# Patient Record
Sex: Male | Born: 1970 | Race: White | Hispanic: No | Marital: Married | Smoking: Former smoker
Health system: Southern US, Community
[De-identification: ages and names within clinical notes are randomized; demographics above are authoritative.]

## PROBLEM LIST (undated history)

## (undated) DIAGNOSIS — K5792 Diverticulitis of intestine, part unspecified, without perforation or abscess without bleeding: Secondary | ICD-10-CM

## (undated) DIAGNOSIS — F419 Anxiety disorder, unspecified: Secondary | ICD-10-CM

## (undated) DIAGNOSIS — K900 Celiac disease: Secondary | ICD-10-CM

## (undated) DIAGNOSIS — I2510 Atherosclerotic heart disease of native coronary artery without angina pectoris: Secondary | ICD-10-CM

## (undated) DIAGNOSIS — IMO0001 Reserved for inherently not codable concepts without codable children: Secondary | ICD-10-CM

## (undated) DIAGNOSIS — I10 Essential (primary) hypertension: Secondary | ICD-10-CM

## (undated) HISTORY — PX: COLONOSCOPY: SHX5424

## (undated) HISTORY — PX: VASECTOMY: SHX75

## (undated) HISTORY — PX: KNEE ARTHROSCOPY W/ MENISCAL REPAIR: SHX1877

## (undated) HISTORY — DX: Celiac disease: K90.0

## (undated) HISTORY — PX: OTHER SURGICAL HISTORY: SHX169

## (undated) HISTORY — PX: RETINOPATHY SURGERY: SHX765

## (undated) HISTORY — DX: Atherosclerotic heart disease of native coronary artery without angina pectoris: I25.10

---

## 1998-08-08 ENCOUNTER — Emergency Department (HOSPITAL_COMMUNITY): Admission: EM | Admit: 1998-08-08 | Discharge: 1998-08-08 | Payer: Self-pay | Admitting: Emergency Medicine

## 1998-08-26 ENCOUNTER — Encounter: Payer: Self-pay | Admitting: Internal Medicine

## 1998-08-26 ENCOUNTER — Ambulatory Visit (HOSPITAL_COMMUNITY): Admission: RE | Admit: 1998-08-26 | Discharge: 1998-08-26 | Payer: Self-pay | Admitting: Internal Medicine

## 1999-06-15 ENCOUNTER — Encounter: Admission: RE | Admit: 1999-06-15 | Discharge: 1999-06-15 | Payer: Self-pay | Admitting: Family Medicine

## 1999-08-11 ENCOUNTER — Encounter: Admission: RE | Admit: 1999-08-11 | Discharge: 1999-08-11 | Payer: Self-pay | Admitting: Family Medicine

## 1999-08-11 ENCOUNTER — Encounter: Payer: Self-pay | Admitting: Family Medicine

## 2000-03-09 ENCOUNTER — Ambulatory Visit (HOSPITAL_COMMUNITY): Admission: RE | Admit: 2000-03-09 | Discharge: 2000-03-09 | Payer: Self-pay | Admitting: *Deleted

## 2003-07-05 ENCOUNTER — Ambulatory Visit (HOSPITAL_COMMUNITY): Admission: RE | Admit: 2003-07-05 | Discharge: 2003-07-05 | Payer: Self-pay | Admitting: Family Medicine

## 2003-07-06 ENCOUNTER — Encounter: Admission: RE | Admit: 2003-07-06 | Discharge: 2003-07-06 | Payer: Self-pay | Admitting: Family Medicine

## 2004-02-17 ENCOUNTER — Encounter: Admission: RE | Admit: 2004-02-17 | Discharge: 2004-05-17 | Payer: Self-pay | Admitting: Family Medicine

## 2005-05-03 ENCOUNTER — Encounter: Admission: RE | Admit: 2005-05-03 | Discharge: 2005-05-03 | Payer: Self-pay | Admitting: Family Medicine

## 2012-10-03 ENCOUNTER — Other Ambulatory Visit: Payer: Self-pay | Admitting: Family Medicine

## 2012-10-03 DIAGNOSIS — M549 Dorsalgia, unspecified: Secondary | ICD-10-CM

## 2012-10-07 ENCOUNTER — Ambulatory Visit
Admission: RE | Admit: 2012-10-07 | Discharge: 2012-10-07 | Disposition: A | Discharge status: Home / Self Care | Payer: PRIVATE HEALTH INSURANCE | Source: Ambulatory Visit | Attending: Family Medicine | Admitting: Family Medicine

## 2012-10-07 DIAGNOSIS — M549 Dorsalgia, unspecified: Secondary | ICD-10-CM

## 2013-09-04 HISTORY — PX: HERNIA REPAIR: SHX51

## 2014-04-20 ENCOUNTER — Encounter (INDEPENDENT_AMBULATORY_CARE_PROVIDER_SITE_OTHER): Payer: Self-pay | Admitting: Surgery

## 2014-04-20 ENCOUNTER — Ambulatory Visit (INDEPENDENT_AMBULATORY_CARE_PROVIDER_SITE_OTHER): Payer: BC Managed Care – PPO | Admitting: Surgery

## 2014-04-20 VITALS — BP 142/100 | HR 86 | Temp 98.5°F | Ht 73.0 in | Wt 231.2 lb

## 2014-04-20 DIAGNOSIS — K4090 Unilateral inguinal hernia, without obstruction or gangrene, not specified as recurrent: Secondary | ICD-10-CM | POA: Insufficient documentation

## 2014-04-20 NOTE — Patient Instructions (Signed)
Our schedules will call you back to schedule surgery. 

## 2014-04-20 NOTE — Progress Notes (Signed)
Patient ID: Chase Mcclure, male   DOB: Jun 12, 1971, 43 y.o.   MRN: [id number]  Chief Complaint  Patient presents with  . Hernia    HPI Chase Mcclure is a 43 y.o. male.   HPI This is a pleasant gentleman referred by [name] for evaluation of a symptomatic right inguinal hernia. He noticed discomfort and a bulge in the right groin after heavy lifting several weeks ago.  He reports a small bulge that easily reduces. Again the discomfort is mild and does not refer any where else. He has no obstructive symptoms. Past Medical History  Diagnosis Date  . Celiac disease     Past Surgical History  Procedure Laterality Date  . Hernia repair    . Vasectomy      History reviewed. No pertinent family history.  Social History History  Substance Use Topics  . Smoking status: Former Games developer  . Smokeless tobacco: Not on file  . Alcohol Use: Yes    Allergies  Allergen Reactions  . Gluten Meal     No current outpatient prescriptions on file.   No current facility-administered medications for this visit.    Review of Systems Review of Systems  Constitutional: Negative for fever, chills and unexpected weight change.  HENT: Negative for congestion, hearing loss, sore throat, trouble swallowing and voice change.   Eyes: Negative for visual disturbance.  Respiratory: Negative for cough and wheezing.   Cardiovascular: Negative for chest pain, palpitations and leg swelling.  Gastrointestinal: Positive for abdominal pain. Negative for nausea, vomiting, diarrhea, constipation, blood in stool, abdominal distention, anal bleeding and rectal pain.  Genitourinary: Negative for hematuria and difficulty urinating.  Musculoskeletal: Negative for arthralgias.  Skin: Negative for rash and wound.  Neurological: Negative for seizures, syncope, weakness and headaches.  Hematological: Negative for adenopathy. Does not bruise/bleed easily.  Psychiatric/Behavioral: Negative for confusion.    Blood  pressure 142/100, pulse 86, temperature 98.5 F (36.9 C), temperature source Oral, height 6\' 1"  (1.854 m), weight 231 lb 4 oz (104.894 kg).  Physical Exam Physical Exam  Constitutional: He is oriented to person, place, and time. He appears well-developed and well-nourished. No distress.  HENT:  Head: Normocephalic and atraumatic.  Right Ear: External ear normal.  Left Ear: External ear normal.  Nose: Nose normal.  Mouth/Throat: Oropharynx is clear and moist. No oropharyngeal exudate.  Eyes: Conjunctivae are normal. Pupils are equal, round, and reactive to light. Right eye exhibits no discharge. Left eye exhibits no discharge. No scleral icterus.  Neck: Normal range of motion. Neck supple. No tracheal deviation present.  Cardiovascular: Normal rate, regular rhythm, normal heart sounds and intact distal pulses.   No murmur heard. Pulmonary/Chest: Breath sounds normal. No respiratory distress. He has no wheezes.  Abdominal: Soft. He exhibits no distension. There is no tenderness.  Easily reducible small right inguinal hernia without evidence of left inguinal hernia  Musculoskeletal: Normal range of motion. He exhibits no edema and no tenderness.  Neurological: He is alert and oriented to person, place, and time.  Skin: Skin is warm and dry. No rash noted. He is not diaphoretic. No erythema.  Psychiatric: His behavior is normal. Judgment normal.    Data Reviewed   Assessment    Right inguinal hernia     Plan    Repair with mesh was recommended. I discussed both the open and laparoscopic techniques. He wishes to proceed with laparoscopic hernia repair with mesh of the right inguinal hernia. I discussed the risks of surgery  which includes but is not limited to bleeding, infection, injury to surrounding structures, nerve entrapment, chronic pain, recurrence, et Karie Sodacetera. I also discussed postoperative recovery. He wishes to proceed with surgery.        Audyn Dimercurio A 04/20/2014,  12:11 PM

## 2014-04-27 ENCOUNTER — Encounter (HOSPITAL_COMMUNITY): Payer: Self-pay | Admitting: Pharmacist

## 2014-04-27 NOTE — Progress Notes (Signed)
Dr. Magnus Ivan - Please enter preop orders in epic for Chase Mcclure - add on for surgery 8/27 and he is coming to [facility] tomorrow 8/25 for preop / labs.  Thanks.

## 2014-04-27 NOTE — Patient Instructions (Signed)
GABREIL YONKERS  04/27/2014   Your procedure is scheduled on:  04/30/2014  Report to [facility].  Follow the Signs to Short Stay Center at  1:35pm       Call this number if you have problems the morning of surgery: [phone]   Remember:   Do not eat food after midnight, you may have clear liquids until 9 am then NPO  Take these medicines the morning of surgery with A SIP OF WATER:    Do not wear jewelry  Do not wear lotions, powders, or perfumes,deodorant    Men may shave face and neck.  Do not bring valuables to the hospital.  Contacts, dentures or bridgework may not be worn into surgery.     Patients discharged the day of surgery will not be allowed to drive  home.  Name and phone number of your driver:     Please read over the following fact sheets that you were given:    CLEAR LIQUID DIET   Foods Allowed                                                                     Foods Excluded  Coffee and tea, regular and decaf                             liquids that you cannot  Plain Jell-O in any flavor                                             see through such as: Fruit ices (not with fruit pulp)                                     milk, soups, orange juice  Iced Popsicles                                    All solid food Carbonated beverages, regular and diet                                    Cranberry, grape and apple juices Sports drinks like Gatorade Lightly seasoned clear broth or consume(fat free) Sugar, honey syrup  Sample Menu Breakfast                                Lunch                                     Supper Cranberry juice                    Beef broth  Chicken broth Jell-O                                     Grape juice                           Apple juice Coffee or tea                        Jell-O                                      Popsicle                                                Coffee or tea                         Coffee or tea  _____________________________________________________________________  [facility] - Preparing for Surgery Before surgery, you can play an important role.  Because skin is not sterile, your skin needs to be as free of germs as possible.  You can reduce the number of germs on your skin by washing with CHG (chlorahexidine gluconate) soap before surgery.  CHG is an antiseptic cleaner which kills germs and bonds with the skin to continue killing germs even after washing. Please DO NOT use if you have an allergy to CHG or antibacterial soaps.  If your skin becomes reddened/irritated stop using the CHG and inform your nurse when you arrive at Short Stay. Do not shave (including legs and underarms) for at least 48 hours prior to the first CHG shower.  You may shave your face/neck. Please follow these instructions carefully:  1.  Shower with CHG Soap the night before surgery and the  morning of Surgery.  2.  If you choose to wash your hair, wash your hair first as usual with your  normal  shampoo.  3.  After you shampoo, rinse your hair and body thoroughly to remove the  shampoo.                           4.  Use CHG as you would any other liquid soap.  You can apply chg directly  to the skin and wash                       Gently with a scrungie or clean washcloth.  5.  Apply the CHG Soap to your body ONLY FROM THE NECK DOWN.   Do not use on face/ open                           Wound or open sores. Avoid contact with eyes, ears mouth and genitals (private parts).                       Wash face,  Genitals (private parts) with your normal soap.             6.  Wash thoroughly, paying special attention to the area  where your surgery  will be performed.  7.  Thoroughly rinse your body with warm water from the neck down.  8.  DO NOT shower/wash with your normal soap after using and rinsing off  the CHG Soap.                9.  Pat yourself dry with a clean towel.            10.   Wear clean pajamas.            11.  Place clean sheets on your bed the night of your first shower and do not  sleep with pets. Day of Surgery : Do not apply any lotions/deodorants the morning of surgery.  Please wear clean clothes to the hospital/surgery center.  FAILURE TO FOLLOW THESE INSTRUCTIONS MAY RESULT IN THE CANCELLATION OF YOUR SURGERY PATIENT SIGNATURE_________________________________  NURSE SIGNATURE__________________________________  ________________________________________________________________________  coughing and deep breathing exercises, leg exercises

## 2014-04-28 ENCOUNTER — Other Ambulatory Visit (INDEPENDENT_AMBULATORY_CARE_PROVIDER_SITE_OTHER): Payer: Self-pay | Admitting: Surgery

## 2014-04-28 ENCOUNTER — Telehealth (INDEPENDENT_AMBULATORY_CARE_PROVIDER_SITE_OTHER): Payer: Self-pay

## 2014-04-28 ENCOUNTER — Encounter (HOSPITAL_COMMUNITY): Payer: Self-pay

## 2014-04-28 ENCOUNTER — Encounter (HOSPITAL_COMMUNITY)
Admission: RE | Admit: 2014-04-28 | Discharge: 2014-04-28 | Disposition: A | Discharge status: Home / Self Care | Payer: BC Managed Care – PPO | Source: Ambulatory Visit | Attending: Surgery | Admitting: Surgery

## 2014-04-28 DIAGNOSIS — Z01818 Encounter for other preprocedural examination: Secondary | ICD-10-CM | POA: Diagnosis present

## 2014-04-28 DIAGNOSIS — K4090 Unilateral inguinal hernia, without obstruction or gangrene, not specified as recurrent: Secondary | ICD-10-CM | POA: Insufficient documentation

## 2014-04-28 LAB — CBC
HCT: 44.7 % (ref 39.0–52.0)
HEMOGLOBIN: 15.7 g/dL (ref 13.0–17.0)
MCH: 32.3 pg (ref 26.0–34.0)
MCHC: 35.1 g/dL (ref 30.0–36.0)
MCV: 92 fL (ref 78.0–100.0)
Platelets: 222 10*3/uL (ref 150–400)
RBC: 4.86 MIL/uL (ref 4.22–5.81)
RDW: 12 % (ref 11.5–15.5)
WBC: 7.9 10*3/uL (ref 4.0–10.5)

## 2014-04-28 NOTE — Telephone Encounter (Signed)
Pt wife called in stating pt is scheduled for lap inguinal hernia repair on Thursday and they have family coming in Saturday for a dinner party at a restaurant. She is asking if he would feel ok to go. Advised he should expect pain and swelling in groin and testicles which may keep him from feeling like going. Advised we want him to get up for short walks through out the day for good blood flow. Let her know some patient have different pain tolerance so I cannot say if he would be in to much pain. Advised he would not drive if he goes and to take a pillow to help support his groin area from the seat belt. Also suggested he keep ice pack on area to help control the swelling. It would be up to the patient os how he feels Saturday about going to dinner.

## 2014-04-30 ENCOUNTER — Ambulatory Visit (HOSPITAL_COMMUNITY): Payer: PRIVATE HEALTH INSURANCE | Admitting: Certified Registered Nurse Anesthetist

## 2014-04-30 ENCOUNTER — Encounter (HOSPITAL_COMMUNITY)
Admission: RE | Disposition: A | Discharge status: Home / Self Care | Payer: Self-pay | Source: Ambulatory Visit | Attending: Surgery

## 2014-04-30 ENCOUNTER — Encounter (HOSPITAL_COMMUNITY): Payer: PRIVATE HEALTH INSURANCE | Admitting: Certified Registered Nurse Anesthetist

## 2014-04-30 ENCOUNTER — Ambulatory Visit (HOSPITAL_COMMUNITY)
Admission: RE | Admit: 2014-04-30 | Discharge: 2014-04-30 | Disposition: A | Discharge status: Home / Self Care | Payer: PRIVATE HEALTH INSURANCE | Source: Ambulatory Visit | Attending: Surgery | Admitting: Surgery

## 2014-04-30 ENCOUNTER — Encounter (HOSPITAL_COMMUNITY): Payer: Self-pay | Admitting: *Deleted

## 2014-04-30 DIAGNOSIS — Z87891 Personal history of nicotine dependence: Secondary | ICD-10-CM | POA: Insufficient documentation

## 2014-04-30 DIAGNOSIS — K4090 Unilateral inguinal hernia, without obstruction or gangrene, not specified as recurrent: Secondary | ICD-10-CM | POA: Insufficient documentation

## 2014-04-30 HISTORY — PX: INSERTION OF MESH: SHX5868

## 2014-04-30 HISTORY — PX: INGUINAL HERNIA REPAIR: SHX194

## 2014-04-30 SURGERY — REPAIR, HERNIA, INGUINAL, LAPAROSCOPIC
Anesthesia: General | Laterality: Right

## 2014-04-30 MED ORDER — LACTATED RINGERS IR SOLN
Status: DC | PRN
  Administered 2014-04-30: 1000 mL

## 2014-04-30 MED ORDER — CEFAZOLIN SODIUM-DEXTROSE 2-3 GM-% IV SOLR
2.0000 g | INTRAVENOUS | Status: AC
  Administered 2014-04-30: 2 g via INTRAVENOUS

## 2014-04-30 MED ORDER — LACTATED RINGERS IV SOLN
INTRAVENOUS | Status: DC
  Administered 2014-04-30: 1000 mL via INTRAVENOUS

## 2014-04-30 MED ORDER — DEXAMETHASONE SODIUM PHOSPHATE 10 MG/ML IJ SOLN
INTRAMUSCULAR | Status: DC | PRN
  Administered 2014-04-30: 10 mg via INTRAVENOUS

## 2014-04-30 MED ORDER — PROPOFOL 10 MG/ML IV BOLUS
INTRAVENOUS | Status: DC | PRN
  Administered 2014-04-30: 200 mg via INTRAVENOUS

## 2014-04-30 MED ORDER — LIDOCAINE HCL (CARDIAC) 20 MG/ML IV SOLN
INTRAVENOUS | Status: DC | PRN
  Administered 2014-04-30: 100 mg via INTRAVENOUS

## 2014-04-30 MED ORDER — KETOROLAC TROMETHAMINE 30 MG/ML IJ SOLN
INTRAMUSCULAR | Status: AC
  Filled 2014-04-30: qty 1

## 2014-04-30 MED ORDER — PROPOFOL 10 MG/ML IV BOLUS
INTRAVENOUS | Status: AC
  Filled 2014-04-30: qty 20

## 2014-04-30 MED ORDER — BUPIVACAINE HCL (PF) 0.5 % IJ SOLN
INTRAMUSCULAR | Status: AC
  Filled 2014-04-30: qty 30

## 2014-04-30 MED ORDER — CISATRACURIUM BESYLATE (PF) 10 MG/5ML IV SOLN
INTRAVENOUS | Status: DC | PRN
  Administered 2014-04-30: 5 mg via INTRAVENOUS

## 2014-04-30 MED ORDER — LACTATED RINGERS IV SOLN: INTRAVENOUS | Status: DC

## 2014-04-30 MED ORDER — ONDANSETRON HCL 4 MG/2ML IJ SOLN
INTRAMUSCULAR | Status: AC
  Filled 2014-04-30: qty 2

## 2014-04-30 MED ORDER — KETOROLAC TROMETHAMINE 30 MG/ML IJ SOLN
INTRAMUSCULAR | Status: DC | PRN
  Administered 2014-04-30: 30 mg via INTRAVENOUS

## 2014-04-30 MED ORDER — MIDAZOLAM HCL 5 MG/5ML IJ SOLN
INTRAMUSCULAR | Status: DC | PRN
  Administered 2014-04-30: 2 mg via INTRAVENOUS

## 2014-04-30 MED ORDER — SODIUM CHLORIDE 0.9 % IV SOLN: 250.0000 mL | INTRAVENOUS | Status: DC | PRN

## 2014-04-30 MED ORDER — SUCCINYLCHOLINE CHLORIDE 20 MG/ML IJ SOLN
INTRAMUSCULAR | Status: DC | PRN
  Administered 2014-04-30: 100 mg via INTRAVENOUS

## 2014-04-30 MED ORDER — MORPHINE SULFATE 10 MG/ML IJ SOLN: 1.0000 mg | INTRAMUSCULAR | Status: DC | PRN

## 2014-04-30 MED ORDER — CISATRACURIUM BESYLATE 20 MG/10ML IV SOLN
INTRAVENOUS | Status: AC
  Filled 2014-04-30: qty 10

## 2014-04-30 MED ORDER — NEOSTIGMINE METHYLSULFATE 10 MG/10ML IV SOLN
INTRAVENOUS | Status: AC
  Filled 2014-04-30: qty 1

## 2014-04-30 MED ORDER — ACETAMINOPHEN 325 MG PO TABS: 650.0000 mg | ORAL_TABLET | ORAL | Status: DC | PRN

## 2014-04-30 MED ORDER — FENTANYL CITRATE 0.05 MG/ML IJ SOLN
INTRAMUSCULAR | Status: AC
  Filled 2014-04-30: qty 5

## 2014-04-30 MED ORDER — BUPIVACAINE HCL (PF) 0.5 % IJ SOLN
INTRAMUSCULAR | Status: DC | PRN
  Administered 2014-04-30: 20 mL

## 2014-04-30 MED ORDER — GLYCOPYRROLATE 0.2 MG/ML IJ SOLN
INTRAMUSCULAR | Status: AC
  Filled 2014-04-30: qty 3

## 2014-04-30 MED ORDER — SODIUM CHLORIDE 0.9 % IJ SOLN: 3.0000 mL | Freq: Two times a day (BID) | INTRAMUSCULAR | Status: DC

## 2014-04-30 MED ORDER — GLYCOPYRROLATE 0.2 MG/ML IJ SOLN
INTRAMUSCULAR | Status: DC | PRN
  Administered 2014-04-30: 0.6 mg via INTRAVENOUS

## 2014-04-30 MED ORDER — HYDROCODONE-ACETAMINOPHEN 5-325 MG PO TABS: 1.0000 | ORAL_TABLET | ORAL | Status: DC | PRN

## 2014-04-30 MED ORDER — SODIUM CHLORIDE 0.9 % IJ SOLN: 3.0000 mL | INTRAMUSCULAR | Status: DC | PRN

## 2014-04-30 MED ORDER — NEOSTIGMINE METHYLSULFATE 10 MG/10ML IV SOLN
INTRAVENOUS | Status: DC | PRN
  Administered 2014-04-30: 5 mg via INTRAVENOUS

## 2014-04-30 MED ORDER — MIDAZOLAM HCL 2 MG/2ML IJ SOLN
INTRAMUSCULAR | Status: AC
  Filled 2014-04-30: qty 2

## 2014-04-30 MED ORDER — FENTANYL CITRATE 0.05 MG/ML IJ SOLN: 25.0000 ug | INTRAMUSCULAR | Status: DC | PRN

## 2014-04-30 MED ORDER — FENTANYL CITRATE 0.05 MG/ML IJ SOLN
INTRAMUSCULAR | Status: DC | PRN
  Administered 2014-04-30 (×2): 50 ug via INTRAVENOUS
  Administered 2014-04-30: 100 ug via INTRAVENOUS
  Administered 2014-04-30: 50 ug via INTRAVENOUS

## 2014-04-30 MED ORDER — ACETAMINOPHEN 650 MG RE SUPP
650.0000 mg | RECTAL | Status: DC | PRN
  Filled 2014-04-30: qty 1

## 2014-04-30 MED ORDER — LIDOCAINE HCL (CARDIAC) 20 MG/ML IV SOLN
INTRAVENOUS | Status: AC
  Filled 2014-04-30: qty 5

## 2014-04-30 MED ORDER — OXYCODONE HCL 5 MG PO TABS: 5.0000 mg | ORAL_TABLET | ORAL | Status: DC | PRN

## 2014-04-30 MED ORDER — ONDANSETRON HCL 4 MG/2ML IJ SOLN
INTRAMUSCULAR | Status: DC | PRN
  Administered 2014-04-30: 4 mg via INTRAVENOUS

## 2014-04-30 MED ORDER — EPHEDRINE SULFATE 50 MG/ML IJ SOLN
INTRAMUSCULAR | Status: DC | PRN
  Administered 2014-04-30: 10 mg via INTRAVENOUS

## 2014-04-30 MED ORDER — CEFAZOLIN SODIUM-DEXTROSE 2-3 GM-% IV SOLR
INTRAVENOUS | Status: AC
  Filled 2014-04-30: qty 50

## 2014-04-30 SURGICAL SUPPLY — 34 items
ADH SKN CLS APL DERMABOND .7 (GAUZE/BANDAGES/DRESSINGS) ×1
APL SKNCLS STERI-STRIP NONHPOA (GAUZE/BANDAGES/DRESSINGS) ×2
BANDAGE ADH SHEER 1  50/CT (GAUZE/BANDAGES/DRESSINGS) IMPLANT
BENZOIN TINCTURE PRP APPL 2/3 (GAUZE/BANDAGES/DRESSINGS) ×4 IMPLANT
CLOSURE WOUND 1/2 X4 (GAUZE/BANDAGES/DRESSINGS) ×1
DECANTER SPIKE VIAL GLASS SM (MISCELLANEOUS) IMPLANT
DERMABOND ADVANCED (GAUZE/BANDAGES/DRESSINGS) ×2
DERMABOND ADVANCED .7 DNX12 (GAUZE/BANDAGES/DRESSINGS) ×2 IMPLANT
DEVICE SECURE STRAP 25 ABSORB (INSTRUMENTS) ×4 IMPLANT
DISSECT BALLN SPACEMKR + OVL (BALLOONS) ×4
DISSECTOR BALLN SPACEMKR + OVL (BALLOONS) ×2 IMPLANT
DISSECTOR BLUNT TIP ENDO 5MM (MISCELLANEOUS) IMPLANT
DRAPE LAPAROSCOPIC ABDOMINAL (DRAPES) ×4 IMPLANT
ELECT REM PT RETURN 9FT ADLT (ELECTROSURGICAL) ×4
ELECTRODE REM PT RTRN 9FT ADLT (ELECTROSURGICAL) ×2 IMPLANT
GLOVE BIOGEL PI IND STRL 7.0 (GLOVE) IMPLANT
GLOVE BIOGEL PI INDICATOR 7.0 (GLOVE)
GLOVE SURG SIGNA 7.5 PF LTX (GLOVE) ×28 IMPLANT
GOWN STRL REUS W/TWL LRG LVL3 (GOWN DISPOSABLE) ×4 IMPLANT
GOWN STRL REUS W/TWL XL LVL3 (GOWN DISPOSABLE) ×8 IMPLANT
KIT BASIN OR (CUSTOM PROCEDURE TRAY) ×4 IMPLANT
MARKER SKIN DUAL TIP RULER LAB (MISCELLANEOUS) ×4 IMPLANT
MESH 3DMAX 4X6 RT LRG (Mesh General) ×4 IMPLANT
NEEDLE INSUFFLATION 14GA 120MM (NEEDLE) IMPLANT
SCISSORS LAP 5X35 DISP (ENDOMECHANICALS) IMPLANT
SET IRRIG TUBING LAPAROSCOPIC (IRRIGATION / IRRIGATOR) ×4 IMPLANT
SOLUTION ANTI FOG 6CC (MISCELLANEOUS) ×4 IMPLANT
STRIP CLOSURE SKIN 1/2X4 (GAUZE/BANDAGES/DRESSINGS) ×3 IMPLANT
SUT MNCRL AB 4-0 PS2 18 (SUTURE) ×4 IMPLANT
TOWEL OR 17X26 10 PK STRL BLUE (TOWEL DISPOSABLE) ×4 IMPLANT
TRAY FOLEY CATH 14FRSI W/METER (CATHETERS) ×4 IMPLANT
TRAY LAP CHOLE (CUSTOM PROCEDURE TRAY) ×4 IMPLANT
TROCAR CANNULA W/PORT DUAL 5MM (MISCELLANEOUS) ×4 IMPLANT
TUBING INSUFFLATION 10FT LAP (TUBING) ×4 IMPLANT

## 2014-04-30 NOTE — Anesthesia Postprocedure Evaluation (Signed)
  Anesthesia Post-op Note  Patient: Chase Mcclure  Procedure(s) Performed: Procedure(s) (LRB): LAPAROSCOPIC RIGHT  INGUINAL HERNIA REPAIR  (Right) INSERTION OF MESH (N/A)  Patient Location: PACU  Anesthesia Type: General  Level of Consciousness: awake and alert   Airway and Oxygen Therapy: Patient Spontanous Breathing  Post-op Pain: mild  Post-op Assessment: Post-op Vital signs reviewed, Patient's Cardiovascular Status Stable, Respiratory Function Stable, Patent Airway and No signs of Nausea or vomiting  Last Vitals:  Filed Vitals:   04/30/14 1511  BP: 132/91  Pulse: 80  Temp: 36.9 C  Resp: 14    Post-op Vital Signs: stable   Complications: No apparent anesthesia complications

## 2014-04-30 NOTE — Discharge Instructions (Signed)
[facility] ______[facility] LAPAROSCOPIC SURGERY: POST OP INSTRUCTIONS Always review your discharge instruction sheet given to you by the facility where your surgery was performed. IF YOU HAVE DISABILITY OR FAMILY LEAVE FORMS, YOU MUST BRING THEM TO THE OFFICE FOR PROCESSING.   DO NOT GIVE THEM TO YOUR DOCTOR.  1. A prescription for pain medication may be given to you upon discharge.  Take your pain medication as prescribed, if needed.  If narcotic pain medicine is not needed, then you may take acetaminophen (Tylenol) or ibuprofen (Advil) as needed. 2. Take your usually prescribed medications unless otherwise directed. 3. If you need a refill on your pain medication, please contact your pharmacy.  They will contact our office to request authorization. Prescriptions will not be filled after 5pm or on week-ends. 4. You should follow a light diet the first few days after arrival home, such as soup and crackers, etc.  Be sure to include lots of fluids daily. 5. Most patients will experience some swelling and bruising in the area of the incisions.  Ice packs will help.  Swelling and bruising can take several days to resolve.  6. It is common to experience some constipation if taking pain medication after surgery.  Increasing fluid intake and taking a stool softener (such as Colace) will usually help or prevent this problem from occurring.  A mild laxative (Milk of Magnesia or Miralax) should be taken according to package instructions if there are no bowel movements after 48 hours. 7. Unless discharge instructions indicate otherwise, you may remove your bandages 24-48 hours after surgery, and you may shower at that time.  You may have steri-strips (small skin tapes) in place directly over the incision.  These strips should be left on the skin for 7-10 days.  If your surgeon used skin glue on the incision, you may shower in 24 hours.  The glue will flake off over the next 2-3 weeks.  Any sutures or  staples will be removed at the office during your follow-up visit. 8. ACTIVITIES:  You may resume regular (light) daily activities beginning the next day--such as daily self-care, walking, climbing stairs--gradually increasing activities as tolerated.  You may have sexual intercourse when it is comfortable.  Refrain from any heavy lifting or straining until approved by your doctor. a. You may drive when you are no longer taking prescription pain medication, you can comfortably wear a seatbelt, and you can safely maneuver your car and apply brakes. b. RETURN TO WORK:  __________________________________________________________ 9. You should see your doctor in the office for a follow-up appointment approximately 2-3 weeks after your surgery.  Make sure that you call for this appointment within a day or two after you arrive home to insure a convenient appointment time. 10. OTHER INSTRUCTIONS: _NO LIFTING MORE THAN 15 POUNDS FOR 3 WEEKS. 11. ICE PACK AND IBUPROFEN ALSO FOR PAIN______________________________________________________________________________________________________________________ __________________________________________________________________________________________________________________________ WHEN TO CALL YOUR DOCTOR: 1. Fever over 101.0 2. Inability to urinate 3. Continued bleeding from incision. 4. Increased pain, redness, or drainage from the incision. 5. Increasing abdominal pain  The clinic staff is available to answer your questions during regular business hours.  Please dont hesitate to call and ask to speak to one of the nurses for clinical concerns.  If you have a medical emergency, go to the nearest emergency room or call 911.  A surgeon from [facility] is always on call at the hospital. [address], [place], [place]  [address] ? [address]  [address] [place], [place]   [address] [phone] ? [phone] ? FAX [phone] Web site:  [email or web address]

## 2014-04-30 NOTE — Transfer of Care (Signed)
Immediate Anesthesia Transfer of Care Note  Patient: Chase Mcclure  Procedure(s) Performed: Procedure(s) (LRB): LAPAROSCOPIC RIGHT  INGUINAL HERNIA REPAIR  (Right) INSERTION OF MESH (N/A)  Patient Location: PACU  Anesthesia Type: General  Level of Consciousness: sedated, patient cooperative and responds to stimulation  Airway & Oxygen Therapy: Patient Spontanous Breathing and Patient connected to face mask oxgen  Post-op Assessment: Report given to PACU RN and Post -op Vital signs reviewed and stable  Post vital signs: Reviewed and stable  Complications: No apparent anesthesia complications

## 2014-04-30 NOTE — H&P (Signed)
Chief Complaint   Patient presents with   .  Hernia   HPI  Chase Mcclure is a 43 y.o. male.  HPI  This is a pleasant gentleman referred by [name] for evaluation of a symptomatic right inguinal hernia. He noticed discomfort and a bulge in the right groin after heavy lifting several weeks ago. He reports a small bulge that easily reduces. Again the discomfort is mild and does not refer any where else. He has no obstructive symptoms.  Past Medical History   Diagnosis  Date   .  Celiac disease     Past Surgical History   Procedure  Laterality  Date   .  Hernia repair     .  Vasectomy     History reviewed. No pertinent family history.  Social History  History   Substance Use Topics   .  Smoking status:  Former Games developer   .  Smokeless tobacco:  Not on file   .  Alcohol Use:  Yes    Allergies   Allergen  Reactions   .  Gluten Meal     No current outpatient prescriptions on file.    No current facility-administered medications for this visit.   Review of Systems  Review of Systems  Constitutional: Negative for fever, chills and unexpected weight change.  HENT: Negative for congestion, hearing loss, sore throat, trouble swallowing and voice change.  Eyes: Negative for visual disturbance.  Respiratory: Negative for cough and wheezing.  Cardiovascular: Negative for chest pain, palpitations and leg swelling.  Gastrointestinal: Positive for abdominal pain. Negative for nausea, vomiting, diarrhea, constipation, blood in stool, abdominal distention, anal bleeding and rectal pain.  Genitourinary: Negative for hematuria and difficulty urinating.  Musculoskeletal: Negative for arthralgias.  Skin: Negative for rash and wound.  Neurological: Negative for seizures, syncope, weakness and headaches.  Hematological: Negative for adenopathy. Does not bruise/bleed easily.  Psychiatric/Behavioral: Negative for confusion.  Blood pressure 142/100, pulse 86, temperature 98.5 F (36.9 C),  temperature source Oral, height  (1.854 m), weight 231 lb 4 oz (104.894 kg).  Physical Exam  Physical Exam  Constitutional: He is oriented to person, place, and time. He appears well-developed and well-nourished. No distress.  HENT:  Head: Normocephalic and atraumatic.  Right Ear: External ear normal.  Left Ear: External ear normal.  Nose: Nose normal.  Mouth/Throat: Oropharynx is clear and moist. No oropharyngeal exudate.  Eyes: Conjunctivae are normal. Pupils are equal, round, and reactive to light. Right eye exhibits no discharge. Left eye exhibits no discharge. No scleral icterus.  Neck: Normal range of motion. Neck supple. No tracheal deviation present.  Cardiovascular: Normal rate, regular rhythm, normal heart sounds and intact distal pulses.  No murmur heard.  Pulmonary/Chest: Breath sounds normal. No respiratory distress. He has no wheezes.  Abdominal: Soft. He exhibits no distension. There is no tenderness.  Easily reducible small right inguinal hernia without evidence of left inguinal hernia  Musculoskeletal: Normal range of motion. He exhibits no edema and no tenderness.  Neurological: He is alert and oriented to person, place, and time.  Skin: Skin is warm and dry. No rash noted. He is not diaphoretic. No erythema.  Psychiatric: His behavior is normal. Judgment normal.  Data Reviewed  Assessment  Right inguinal hernia  Plan  Repair with mesh was recommended. I discussed both the open and laparoscopic techniques. He wishes to proceed with laparoscopic hernia repair with mesh of the right inguinal hernia. I discussed the risks of surgery which  includes but is not limited to bleeding, infection, injury to surrounding structures, nerve entrapment, chronic pain, recurrence, et Karie Soda. I also discussed postoperative recovery. He wishes to proceed with surgery.

## 2014-04-30 NOTE — Op Note (Signed)
LAPAROSCOPIC RIGHT  INGUINAL HERNIA REPAIR , INSERTION OF MESH  Procedure Note  Chase Mcclure 04/30/2014   Pre-op Diagnosis: right inguinal hernia     Post-op Diagnosis: same  Procedure(s): LAPAROSCOPIC RIGHT  INGUINAL HERNIA REPAIR  INSERTION OF MESH  Surgeon(s): Shelly Rubenstein, MD  Anesthesia: General  Staff:  Circulator: Dominga Ferry, RN Scrub Person: Jettie Pagan; Patsi Sears, RN  Estimated Blood Loss: Minimal                         [name]   Date: 04/30/2014  Time: 2:23 PM

## 2014-04-30 NOTE — Anesthesia Preprocedure Evaluation (Addendum)
Anesthesia Evaluation  Patient identified by MRN, date of birth, ID band Patient awake    Reviewed: Allergy & Precautions, H&P , NPO status , Patient's Chart, lab work & pertinent test results  Airway Mallampati: II TM Distance: >3 FB Neck ROM: full    Dental no notable dental hx. (+) Teeth Intact, Dental Advisory Given   Pulmonary neg pulmonary ROS, former smoker,  breath sounds clear to auscultation  Pulmonary exam normal       Cardiovascular Exercise Tolerance: Good negative cardio ROS  Rhythm:regular Rate:Normal     Neuro/Psych negative neurological ROS  negative psych ROS   GI/Hepatic negative GI ROS, Neg liver ROS,   Endo/Other  negative endocrine ROS  Renal/GU negative Renal ROS  negative genitourinary   Musculoskeletal   Abdominal   Peds  Hematology negative hematology ROS (+)   Anesthesia Other Findings   Reproductive/Obstetrics negative OB ROS                        Anesthesia Physical Anesthesia Plan  ASA: I  Anesthesia Plan: General   Post-op Pain Management:    Induction: Intravenous  Airway Management Planned: Oral ETT  Additional Equipment:   Intra-op Plan:   Post-operative Plan: Extubation in OR  Informed Consent: I have reviewed the patients History and Physical, chart, labs and discussed the procedure including the risks, benefits and alternatives for the proposed anesthesia with the patient or authorized representative who has indicated his/her understanding and acceptance.   Dental Advisory Given  Plan Discussed with: CRNA and Surgeon  Anesthesia Plan Comments:         Anesthesia Quick Evaluation  

## 2014-04-30 NOTE — Op Note (Signed)
NAME:  Chase Mcclure, Chase Mcclure NO.:  [id number]  MEDICAL RECORD NO.:  [id number]  LOCATION:  [facility]                         FACILITY:  [facility]  PHYSICIAN:  Abigail Miyamoto, M.D. DATE OF BIRTH:  [birth year]  DATE OF PROCEDURE:  04/30/2014 DATE OF DISCHARGE:  04/30/2014                              OPERATIVE REPORT   PREOPERATIVE DIAGNOSIS:  Right inguinal hernia.  POSTOPERATIVE DIAGNOSIS:  Right inguinal hernia.  PROCEDURE:  Laparoscopic right inguinal hernia repair with mesh.  SURGEON:  Abigail Miyamoto, MD  ANESTHESIA:  General and 0.5% Marcaine.  ESTIMATED BLOOD LOSS:  Minimal.  FINDINGS:  The patient was found to have an indirect right inguinal hernia.  It was repaired with a piece of Bard 3D Max Prolene mesh, large size.  PROCEDURE IN DETAIL:  The patient was brought to the operating room and identified as [name].  He was placed supine on the operating table and general anesthesia was induced.  A Foley catheter was inserted to the bladder.  His abdomen was prepped and draped in usual sterile fashion.  I made a small incision just below the umbilicus with a scalpel.  I took this down to the fascia which was opened just to the right of midline with a scalpel.  I elevated the underlying rectus muscles.  I then placed a dissecting balloon underneath the rectus muscle and manipulated towards the pubis.  I then dissected out and freed the preperitoneal spaces  under direct vision with a dissecting balloon.  I then removed the dissecting balloon and insufflation was begun with carbon dioxide.  I then placed two 5-mm ports in the patient's lower midline both under direct vision.  I dissected out the right inguinal area.  The patient had a very large chronic indirect hernia sac which I was able to separate from the cord structures and reduce completely.  There was no evidence of direct hernia.  His previous open left inguinal hernia repair appeared intact.  At  this point, a piece of Bard 3D Max mesh was brought to the field.  I used a large piece of mesh.  I placed it through the port at the umbilicus and opened this all the way on the inguinal floor.  I then tacked the mesh to Cooper's ligament, up the medial abdominal wall, and out laterally. Good coverage of the cord structures and inguinal canal appeared to be achieved.  Hemostasis also appeared to be achieved.  At this point, the preperitoneal space was deflated and correct position of the mesh appeared to be achieved.  All ports were removed under direct vision. Some areas that leaked to the peritoneal cavity which I was able to relieve with a hemostat.  I then closed the fascia at the umbilicus with figure-of-eight 0 Vicryl suture.  All incisions were anesthetized with Marcaine.  I performed an ilioinguinal nerve block with Marcaine as well.  Skin was then closed with Dermabond.  The patient tolerated the procedure well.  All counts were correct at the end of the procedure.  The Foley catheter was then removed and the patient was extubated in the operating room and taken in stable condition to recovery room.  This is  up:  Shows detail thank.     Abigail Miyamoto, M.D.     DB/MEDQ  D:  04/30/2014  T:  04/30/2014  Job:  [id number]

## 2014-05-01 ENCOUNTER — Encounter (HOSPITAL_COMMUNITY): Payer: Self-pay | Admitting: Surgery

## 2014-12-04 ENCOUNTER — Other Ambulatory Visit: Payer: Self-pay | Admitting: Rheumatology

## 2014-12-07 ENCOUNTER — Other Ambulatory Visit: Payer: Self-pay | Admitting: Rheumatology

## 2014-12-08 ENCOUNTER — Other Ambulatory Visit: Payer: Self-pay | Admitting: Rheumatology

## 2014-12-08 DIAGNOSIS — R9389 Abnormal findings on diagnostic imaging of other specified body structures: Secondary | ICD-10-CM

## 2014-12-09 ENCOUNTER — Ambulatory Visit
Admission: RE | Admit: 2014-12-09 | Discharge: 2014-12-09 | Disposition: A | Discharge status: Home / Self Care | Payer: PRIVATE HEALTH INSURANCE | Source: Ambulatory Visit | Attending: Rheumatology | Admitting: Rheumatology

## 2014-12-09 DIAGNOSIS — R9389 Abnormal findings on diagnostic imaging of other specified body structures: Secondary | ICD-10-CM

## 2014-12-09 MED ORDER — IOPAMIDOL (ISOVUE-300) INJECTION 61%
75.0000 mL | Freq: Once | INTRAVENOUS | Status: AC | PRN
Start: 2014-12-09 — End: 2014-12-09
  Administered 2014-12-09: 75 mL via INTRAVENOUS

## 2014-12-11 ENCOUNTER — Telehealth: Payer: Self-pay | Admitting: Internal Medicine

## 2014-12-11 NOTE — Telephone Encounter (Signed)
Called pt with appt for Dr. Arbutus PedMohamed.  Chase Mcclure 12/22/14 1:45 pm

## 2014-12-14 ENCOUNTER — Telehealth: Payer: Self-pay | Admitting: *Deleted

## 2014-12-14 NOTE — Telephone Encounter (Signed)
Spoke with Dr. Arbutus PedMohamed this am.  Patient's wife is requesting an earlier appt.  I have re-scheduled patient to see Dr. Arbutus PedMohamed on 12/16/14 arrive at 11:00.  Patient verbalized understanding of appt time and place.

## 2014-12-16 ENCOUNTER — Other Ambulatory Visit (HOSPITAL_BASED_OUTPATIENT_CLINIC_OR_DEPARTMENT_OTHER): Payer: PRIVATE HEALTH INSURANCE

## 2014-12-16 ENCOUNTER — Encounter: Payer: Self-pay | Admitting: *Deleted

## 2014-12-16 ENCOUNTER — Telehealth: Payer: Self-pay | Admitting: *Deleted

## 2014-12-16 ENCOUNTER — Other Ambulatory Visit: Payer: Self-pay | Admitting: Medical Oncology

## 2014-12-16 ENCOUNTER — Encounter: Payer: Self-pay | Admitting: Internal Medicine

## 2014-12-16 ENCOUNTER — Ambulatory Visit (HOSPITAL_BASED_OUTPATIENT_CLINIC_OR_DEPARTMENT_OTHER): Payer: PRIVATE HEALTH INSURANCE | Admitting: Internal Medicine

## 2014-12-16 ENCOUNTER — Telehealth: Payer: Self-pay | Admitting: Internal Medicine

## 2014-12-16 VITALS — BP 154/98 | HR 73 | Temp 98.7°F | Resp 20 | Ht 71.0 in | Wt 219.3 lb

## 2014-12-16 DIAGNOSIS — R599 Enlarged lymph nodes, unspecified: Secondary | ICD-10-CM | POA: Diagnosis not present

## 2014-12-16 DIAGNOSIS — R590 Localized enlarged lymph nodes: Secondary | ICD-10-CM

## 2014-12-16 DIAGNOSIS — D860 Sarcoidosis of lung: Secondary | ICD-10-CM | POA: Insufficient documentation

## 2014-12-16 LAB — CBC WITH DIFFERENTIAL/PLATELET
BASO%: 0.2 % (ref 0.0–2.0)
Basophils Absolute: 0 10*3/uL (ref 0.0–0.1)
EOS%: 4.5 % (ref 0.0–7.0)
Eosinophils Absolute: 0.4 10*3/uL (ref 0.0–0.5)
HCT: 41.5 % (ref 38.4–49.9)
HGB: 14.7 g/dL (ref 13.0–17.1)
LYMPH%: 22.8 % (ref 14.0–49.0)
MCH: 32.1 pg (ref 27.2–33.4)
MCHC: 35.4 g/dL (ref 32.0–36.0)
MCV: 90.6 fL (ref 79.3–98.0)
MONO#: 0.6 10*3/uL (ref 0.1–0.9)
MONO%: 6.6 % (ref 0.0–14.0)
NEUT#: 5.7 10*3/uL (ref 1.5–6.5)
NEUT%: 65.9 % (ref 39.0–75.0)
PLATELETS: 243 10*3/uL (ref 140–400)
RBC: 4.58 10*6/uL (ref 4.20–5.82)
RDW: 12.3 % (ref 11.0–14.6)
WBC: 8.6 10*3/uL (ref 4.0–10.3)
lymph#: 2 10*3/uL (ref 0.9–3.3)

## 2014-12-16 LAB — LACTATE DEHYDROGENASE (CC13): LDH: 166 U/L (ref 125–245)

## 2014-12-16 LAB — COMPREHENSIVE METABOLIC PANEL (CC13)
ALT: 36 U/L (ref 0–55)
AST: 23 U/L (ref 5–34)
Albumin: 4.2 g/dL (ref 3.5–5.0)
Alkaline Phosphatase: 93 U/L (ref 40–150)
Anion Gap: 10 mEq/L (ref 3–11)
BUN: 11 mg/dL (ref 7.0–26.0)
CALCIUM: 9.3 mg/dL (ref 8.4–10.4)
CO2: 27 meq/L (ref 22–29)
Chloride: 104 mEq/L (ref 98–109)
Creatinine: 0.9 mg/dL (ref 0.7–1.3)
EGFR: >90 mL/min/{1.73_m2} (ref >90)
GLUCOSE: 100 mg/dL (ref 70–140)
POTASSIUM: 4.2 meq/L (ref 3.5–5.1)
SODIUM: 142 meq/L (ref 136–145)
Total Bilirubin: 1.13 mg/dL (ref 0.20–1.20)
Total Protein: 7.4 g/dL (ref 6.4–8.3)

## 2014-12-16 NOTE — Progress Notes (Signed)
[facility] Telephone:([phone]) [phone]   Fax:([phone]  CONSULT NOTE  REFERRING PHYSICIAN: Dr. Zenovia Jordan  REASON FOR CONSULTATION:  44 years old white male with mediastinal lymphadenopathy.  HPI Chase Mcclure is a 44 y.o. male with past medical history significant for well-controlled hypertension and dyslipidemia as well as history of celiac disease diagnosed 12 years ago. The patient has been complaining recently of increasing fatigue and weakness as well as arthritis. He was seen by his primary care physician [name] and during his evaluation he was found to have positive ANA. He was referred to [name] for further evaluation and the patient was complaining of shortness of breath. He had chest x-ray performed in her office that showed chest abnormality. This was followed by CT scan of the chest on 12/11/2014 and it showed mediastinal and bilateral hilar adenopathy. The AP 12 lymph node measure 2.0 cm in short axis, superior right hilar node measured 2.0 cm, subcarinal lymph node 1.5 cm with multiple additional enlargement and thyroid not enlarged sized lymph nodes seen throughout both hila and mediastinum. There was also 0.8 cm inferior paraesophageal mediastinal node. There was also mildly enlarged periportal nodes and questionable gastrosplenic ligament lymph nodes versus splenules.  [name] kindly referred the patient to me today for further evaluation and recommendation regarding these abnormality in his chest. When seen today the patient still complaining of joint pain but much improved compared to a few weeks ago. He also has low back pain and occasional shortness of breath. Has been complaining of tightness in his chest over the last 5 days. He has a weight loss of around 20 pounds over the last 3 months with fewer night sweats. He denied having any significant nausea or vomiting, no fever or chills. Family history significant for a father with rheumatoid  arthritis and mother with brain aneurysm. The patient is married and has 3 children age 67, 26 and 95. He works for a [employer]. He has no history of smoking but has a long history of alcohol drinking and quit recently. No history of drug abuse. HPI  Past Medical History  Diagnosis Date  . Celiac disease     Past Surgical History  Procedure Laterality Date  . Hernia repair    . Vasectomy    . Trauma surgery       punctured lung lacerated spleen   . Inguinal hernia repair Right 04/30/2014    Procedure: LAPAROSCOPIC RIGHT  INGUINAL HERNIA REPAIR ;  Surgeon: Shelly Rubenstein, MD;  Location: [facility];  Service: General;  Laterality: Right;  . Insertion of mesh N/A 04/30/2014    Procedure: INSERTION OF MESH;  Surgeon: Shelly Rubenstein, MD;  Location: [facility];  Service: General;  Laterality: N/A;    History reviewed. No pertinent family history.  Social History History  Substance Use Topics  . Smoking status: Former Smoker -- 0.25 packs/day    Types: Cigarettes    Quit date: 09/04/1988  . Smokeless tobacco: Former Neurosurgeon    Types: Chew    Quit date: 09/04/1998  . Alcohol Use: 3.6 - 7.2 oz/week    6-12 Cans of beer per week     Comment: occasional during the week     Allergies  Allergen Reactions  . Gluten Meal     Celiac disease    Current Outpatient Prescriptions  Medication Sig Dispense Refill  . naproxen sodium (ANAPROX) 220 MG tablet Take 220 mg by mouth  2 (two) times daily between meals as needed.     No current facility-administered medications for this visit.    Review of Systems  Constitutional: positive for fatigue and weight loss Eyes: negative Ears, nose, mouth, throat, and face: negative Respiratory: positive for dyspnea on exertion Cardiovascular: negative Gastrointestinal: negative Genitourinary:negative Integument/breast: negative Hematologic/lymphatic: negative Musculoskeletal:positive for arthralgias and back pain Neurological:  negative Behavioral/Psych: negative Endocrine: negative Allergic/Immunologic: negative  Physical Exam  WJX:BJYNWRAL:alert, healthy, no distress, well nourished, well developed and anxious SKIN: skin color, texture, turgor are normal, no rashes or significant lesions HEAD: Normocephalic, No masses, lesions, tenderness or abnormalities EYES: normal, PERRLA, Conjunctiva are pink and non-injected EARS: External ears normal, Canals clear OROPHARYNX:no exudate and no erythema  NECK: supple, no adenopathy, no JVD LYMPH:  no palpable lymphadenopathy, no hepatosplenomegaly LUNGS: clear to auscultation , and palpation HEART: regular rate & rhythm, no murmurs and no gallops ABDOMEN:abdomen soft, non-tender, normal bowel sounds and no masses or organomegaly BACK: Back symmetric, no curvature., No CVA tenderness EXTREMITIES:no joint deformities, effusion, or inflammation, no edema, no skin discoloration  NEURO: alert & oriented x 3 with fluent speech, no focal motor/sensory deficits  PERFORMANCE STATUS: ECOG 0  LABORATORY DATA: Lab Results  Component Value Date   WBC 8.6 12/16/2014   HGB 14.7 12/16/2014   HCT 41.5 12/16/2014   MCV 90.6 12/16/2014   PLT 243 12/16/2014      Chemistry      Component Value Date/Time   NA 142 12/16/2014 1055   K 4.2 12/16/2014 1055   CO2 27 12/16/2014 1055   BUN 11.0 12/16/2014 1055   CREATININE 0.9 12/16/2014 1055      Component Value Date/Time   CALCIUM 9.3 12/16/2014 1055   ALKPHOS 93 12/16/2014 1055   AST 23 12/16/2014 1055   ALT 36 12/16/2014 1055   BILITOT 1.13 12/16/2014 1055       RADIOGRAPHIC STUDIES: Ct Chest W Contrast  12/09/2014   CLINICAL DATA:  Abnormal chest radiograph, shortness of breath, 10 pound weight loss since January 2016, former smoker, history celiac disease  EXAM: CT CHEST WITH CONTRAST  TECHNIQUE: Multidetector CT imaging of the chest was performed during intravenous contrast administration. Sagittal and coronal MPR images  reconstructed from axial data set.  CONTRAST:  75 cc Isovue 300 IV  COMPARISON:  Chest radiograph 12/02/2014  FINDINGS: Thoracic vascular structures grossly patent on nondedicated exam.  Mediastinal and BILATERAL hilar adenopathy present.  AP window node 20 mm short axis image 21.  Superior RIGHT hilar node 20 mm short axis image 25.  Subcarinal node 15 mm short axis image 28.  Multiple additional enlarged and upper normal sized lymph nodes seen throughout both hila and mediastinum.  8 mm inferior mediastinal node paraesophageal image 46.  No axillary adenopathy.  Mildly enlarged periportal nodes.  Question gastrosplenic ligament lymph nodes versus splenules at splenic hilum.  Remaining visualized upper abdomen normal appearance, spleen normal in size.  Lungs clear.  No pulmonary infiltrate, pleural effusion or pneumothorax.  No definite pulmonary mass or nodule.  Scattered Schmorl's nodes without acute bony abnormality.  Minimal gynecomastia.  IMPRESSION: Adenopathy identified within mediastinum, hila, and in periportal region.  Differential diagnosis includes lymphoma, metastatic disease, Castlemann disease, less likely sarcoidosis or a reactive process.  No other significant intra thoracic abnormalities.   Electronically Signed   By: Ulyses SouthwardMark  Boles M.D.   On: 12/09/2014 11:32    ASSESSMENT: This is a very pleasant 44 years old white male who  presents with mediastinal adenopathy in addition to periportal lymphadenopathy. This finding concerning for chronic granulomatous disease like sarcoidosis versus lymphoma and less likely metastatic lung cancer.   PLAN: I had a lengthy discussion with the patient and his wife today about his current condition and further investigation to confirm the diagnosis and to rule out malignancy. I ordered a PET scan for further evaluation of his disease. I will also refer the patient to thoracic surgery for consideration of endoscopic bronchoscopy with biopsy of the hilar lymph  nodes plus/minus mediastinoscopy if needed. I will see the patient back for follow-up visit in 2 weeks for reevaluation and discussion of his treatment options based on the final pathology report. The patient was advised to call immediately if he has any concerning symptoms in the interval.  The patient voices understanding of current disease status and treatment options and is in agreement with the current care plan.  All questions were answered. The patient knows to call the clinic with any problems, questions or concerns. We can certainly see the patient much sooner if necessary.  Thank you so much for allowing me to participate in the care of SHERYL TOWELL. I will continue to follow up the patient with you and assist in his care.  I spent 40 minutes counseling the patient face to face. The total time spent in the appointment was 60 minutes.  Disclaimer: This note was dictated with voice recognition software. Similar sounding words can inadvertently be transcribed and may not be corrected upon review.   Markeda Narvaez K. December 16, 2014, 12:12 PM

## 2014-12-16 NOTE — Telephone Encounter (Signed)
Called patient with appt to see Dr. Maren BeachVanTrigt.  Patient is having PET scan at that time and day.  I will contact [facility] again for another appt.

## 2014-12-16 NOTE — Telephone Encounter (Signed)
Pt confirmed labs/ov per 04/13 POF, gave pt AVS and Calendar..... Chase Mcclure °

## 2014-12-17 ENCOUNTER — Telehealth: Payer: Self-pay | Admitting: *Deleted

## 2014-12-17 NOTE — Telephone Encounter (Signed)
Called patient to give him appt to see Dr. Maren BeachVanTrigt on 12/25/14 at 3pm.  He verbalized understanding of appt time and place.

## 2014-12-18 ENCOUNTER — Telehealth: Payer: Self-pay | Admitting: *Deleted

## 2014-12-18 NOTE — Telephone Encounter (Signed)
Patient wife called left vm message about appt with Dr. Maren BeachVanTrigt.  I called Mr. Chase Mcclure back about appt.  I left vm message.  I also, gave him the number for [facility] if he would like to change appt time.

## 2014-12-22 ENCOUNTER — Other Ambulatory Visit: Payer: PRIVATE HEALTH INSURANCE

## 2014-12-22 ENCOUNTER — Ambulatory Visit: Payer: PRIVATE HEALTH INSURANCE

## 2014-12-22 ENCOUNTER — Ambulatory Visit: Payer: PRIVATE HEALTH INSURANCE | Admitting: Internal Medicine

## 2014-12-25 ENCOUNTER — Institutional Professional Consult (permissible substitution) (INDEPENDENT_AMBULATORY_CARE_PROVIDER_SITE_OTHER): Payer: PRIVATE HEALTH INSURANCE | Admitting: Cardiothoracic Surgery

## 2014-12-25 ENCOUNTER — Ambulatory Visit (HOSPITAL_COMMUNITY)
Admission: RE | Admit: 2014-12-25 | Discharge: 2014-12-25 | Disposition: A | Discharge status: Home / Self Care | Payer: PRIVATE HEALTH INSURANCE | Source: Ambulatory Visit | Attending: Internal Medicine | Admitting: Internal Medicine

## 2014-12-25 ENCOUNTER — Other Ambulatory Visit: Payer: Self-pay | Admitting: *Deleted

## 2014-12-25 ENCOUNTER — Encounter: Payer: Self-pay | Admitting: Cardiothoracic Surgery

## 2014-12-25 VITALS — BP 126/83 | HR 82 | Resp 20 | Ht 71.0 in | Wt 219.0 lb

## 2014-12-25 DIAGNOSIS — R599 Enlarged lymph nodes, unspecified: Secondary | ICD-10-CM | POA: Diagnosis not present

## 2014-12-25 DIAGNOSIS — R590 Localized enlarged lymph nodes: Secondary | ICD-10-CM

## 2014-12-25 LAB — GLUCOSE, CAPILLARY: GLUCOSE-CAPILLARY: 93 mg/dL (ref 70–99)

## 2014-12-25 MED ORDER — FLUDEOXYGLUCOSE F - 18 (FDG) INJECTION
12.5000 | Freq: Once | INTRAVENOUS | Status: AC | PRN
  Administered 2014-12-25: 12.5 via INTRAVENOUS

## 2014-12-28 ENCOUNTER — Ambulatory Visit: Payer: PRIVATE HEALTH INSURANCE | Admitting: Internal Medicine

## 2014-12-28 ENCOUNTER — Encounter (HOSPITAL_COMMUNITY): Payer: Self-pay | Admitting: *Deleted

## 2014-12-28 ENCOUNTER — Other Ambulatory Visit: Payer: Self-pay | Admitting: *Deleted

## 2014-12-28 DIAGNOSIS — R590 Localized enlarged lymph nodes: Secondary | ICD-10-CM

## 2014-12-28 MED ORDER — CHLORHEXIDINE GLUCONATE CLOTH 2 % EX PADS: 6.0000 | MEDICATED_PAD | Freq: Once | CUTANEOUS | Status: DC

## 2014-12-28 MED ORDER — DEXTROSE 5 % IV SOLN
1.5000 g | INTRAVENOUS | Status: AC
  Administered 2014-12-29: 1.5 g via INTRAVENOUS
  Filled 2014-12-28: qty 1.5

## 2014-12-28 NOTE — Progress Notes (Signed)
PCP is Mickie Hillier, MD Referring Provider is Si Gaul, MD  Chief Complaint  Patient presents with  . Adenopathy    Surgical eval for consideration of endoscopic bronchoscopy with biopsy, PET Scan 12/25/14, Chest CT 12/09/14    HPI: Patient presents for evaluation of recently diagnosed diffuse mediastinal and hepatoduodenal adenopathy with mild-moderate activity and PET scan. Patient has some interstitial lung disease as well and history of dry cough and some weight loss. 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 The patient was referred by his physician oncologist for biopsy of mediastinal lymph nodes. The patient does not smoke. He has no prior history of thoracic trauma or previous lung disease. No changes in vision. He denies occasional night sweats.   Past Medical History  Diagnosis Date  . Celiac disease   . Hypertension     Past Surgical History  Procedure Laterality Date  . Hernia repair    . Vasectomy    . Trauma surgery       punctured lung lacerated spleen   . Inguinal hernia repair Right 04/30/2014    Procedure: LAPAROSCOPIC RIGHT  INGUINAL HERNIA REPAIR ;  Surgeon: Shelly Rubenstein, MD;  Location: [facility];  Service: General;  Laterality: Right;  . Insertion of mesh N/A 04/30/2014    Procedure: INSERTION OF MESH;  Surgeon: Shelly Rubenstein, MD;  Location: [facility];  Service: General;  Laterality: N/A;    No family history on file.  Social History History  Substance Use Topics  . Smoking status: Former Smoker -- 0.25 packs/day    Types: Cigarettes    Quit date: 09/04/1988  . Smokeless tobacco: Former Neurosurgeon    Types: Chew    Quit date: 09/04/1998  . Alcohol Use: 3.6 - 7.2 oz/week    6-12 Cans of beer per week     Comment: occasional during the week     Current Outpatient Prescriptions  Medication Sig Dispense Refill  . naproxen sodium (ANAPROX) 220 MG tablet Take 440 mg by mouth 2 (two) times daily as needed (pain). Aleve     No  current facility-administered medications for this visit.   Facility-Administered Medications Ordered in Other Visits  Medication Dose Route Frequency Provider Last Rate Last Dose  . cefUROXime (ZINACEF) 1.5 g in dextrose 5 % 50 mL IVPB  1.5 g Intravenous 60 min Pre-Op Kerin Perna, MD      . Chlorhexidine Gluconate Cloth 2 % PADS 6 each  6 each Topical Once Kerin Perna, MD        Allergies  Allergen Reactions  . Gluten Meal Other (See Comments)    Celiac disease    Review of Systems  Gen.-positive for weight loss HEENT-no active dental complaints or change in vision Thorax-dry cough, abnormal CT and PET scan Cardiac-no history of angina or palpitation or murmur Abdomen-clear of diarrhea or pain Extremities-no arthritis or swelling Neurologic-no stroke or seizure  BP 126/83 mmHg  Pulse 82  Resp 20  Ht  (1.803 m)  Wt 219 lb (99.338 kg)  BMI 30.56 kg/m2  SpO2 98% Physical Exam  General: Alert healthy appearing middle-aged Caucasian male HEENT: Normocephalic pupils equal , dentition adequate Neck: Supple without JVD, adenopathy, or bruit Chest: Clear to auscultation, symmetrical breath sounds, no rhonchi, no tenderness             or deformity Cardiovascular: Regular rate and rhythm, no murmur, no gallop, peripheral pulses  palpable in all extremities Abdomen:  Soft, nontender, no palpable mass or organomegaly Extremities: Warm, well-perfused, no clubbing cyanosis edema or tenderness,              no venous stasis changes of the legs Rectal/GU: Deferred Neuro: Grossly non--focal and symmetrical throughout Skin: Clean and dry without rash or ulceration   Diagnostic Tests: PET scan CT scan reviewed. The patient has precarinal lymph nodes with hypermetabolic activity as well as a right paratracheal node that could be reached by mediastinoscopy   Impression: We will plan EBUS-bronchoscopic ultrasound guided biopsy of the precarinal node and if  negative will proceed with mediastinoscopy and biopsy of the paratracheal node while under general anesthesia on April 26  Plan:Surgery including E BUS and mediastinoscopy discussed in detail with patient and family including indications risks benefits and alternatives.   Mikey BussingPeter Van Trigt III, MD [facility] [phone]

## 2014-12-29 ENCOUNTER — Ambulatory Visit (HOSPITAL_COMMUNITY): Payer: PRIVATE HEALTH INSURANCE | Admitting: Anesthesiology

## 2014-12-29 ENCOUNTER — Ambulatory Visit (HOSPITAL_COMMUNITY): Payer: PRIVATE HEALTH INSURANCE

## 2014-12-29 ENCOUNTER — Encounter (HOSPITAL_COMMUNITY): Payer: Self-pay | Admitting: Certified Registered Nurse Anesthetist

## 2014-12-29 ENCOUNTER — Ambulatory Visit (HOSPITAL_COMMUNITY)
Admission: RE | Admit: 2014-12-29 | Discharge: 2014-12-29 | Disposition: A | Discharge status: Home / Self Care | Payer: PRIVATE HEALTH INSURANCE | Source: Ambulatory Visit | Attending: Cardiothoracic Surgery | Admitting: Cardiothoracic Surgery

## 2014-12-29 ENCOUNTER — Encounter (HOSPITAL_COMMUNITY)
Admission: RE | Disposition: A | Discharge status: Home / Self Care | Payer: Self-pay | Source: Ambulatory Visit | Attending: Cardiothoracic Surgery

## 2014-12-29 DIAGNOSIS — R590 Localized enlarged lymph nodes: Secondary | ICD-10-CM | POA: Insufficient documentation

## 2014-12-29 DIAGNOSIS — Z87891 Personal history of nicotine dependence: Secondary | ICD-10-CM | POA: Insufficient documentation

## 2014-12-29 DIAGNOSIS — Z9889 Other specified postprocedural states: Secondary | ICD-10-CM

## 2014-12-29 DIAGNOSIS — I10 Essential (primary) hypertension: Secondary | ICD-10-CM | POA: Diagnosis not present

## 2014-12-29 HISTORY — PX: VIDEO BRONCHOSCOPY WITH ENDOBRONCHIAL ULTRASOUND: SHX6177

## 2014-12-29 HISTORY — DX: Anxiety disorder, unspecified: F41.9

## 2014-12-29 HISTORY — DX: Essential (primary) hypertension: I10

## 2014-12-29 HISTORY — PX: MEDIASTINOSCOPY: SHX5086

## 2014-12-29 HISTORY — DX: Reserved for inherently not codable concepts without codable children: IMO0001

## 2014-12-29 HISTORY — DX: Diverticulitis of intestine, part unspecified, without perforation or abscess without bleeding: K57.92

## 2014-12-29 LAB — COMPREHENSIVE METABOLIC PANEL
ALT: 27 U/L (ref 0–53)
AST: 21 U/L (ref 0–37)
Albumin: 3.9 g/dL (ref 3.5–5.2)
Alkaline Phosphatase: 83 U/L (ref 39–117)
Anion gap: 7 (ref 5–15)
BUN: 9 mg/dL (ref 6–23)
CO2: 27 mmol/L (ref 19–32)
Calcium: 9.3 mg/dL (ref 8.4–10.5)
Chloride: 105 mmol/L (ref 96–112)
Creatinine, Ser: 0.91 mg/dL (ref 0.50–1.35)
GFR calc Af Amer: >90 mL/min (ref >90)
GFR calc non Af Amer: >90 mL/min (ref >90)
Glucose, Bld: 108 mg/dL — ABNORMAL HIGH (ref 70–99)
Potassium: 4.2 mmol/L (ref 3.5–5.1)
Sodium: 139 mmol/L (ref 135–145)
Total Bilirubin: 1 mg/dL (ref 0.3–1.2)
Total Protein: 6.9 g/dL (ref 6.0–8.3)

## 2014-12-29 LAB — CBC
HCT: 39.9 % (ref 39.0–52.0)
Hemoglobin: 14.2 g/dL (ref 13.0–17.0)
MCH: 32 pg (ref 26.0–34.0)
MCHC: 35.6 g/dL (ref 30.0–36.0)
MCV: 89.9 fL (ref 78.0–100.0)
Platelets: 228 10*3/uL (ref 150–400)
RBC: 4.44 MIL/uL (ref 4.22–5.81)
RDW: 12.5 % (ref 11.5–15.5)
WBC: 7.7 10*3/uL (ref 4.0–10.5)

## 2014-12-29 LAB — PROTIME-INR
INR: 1.11 (ref 0.00–1.49)
Prothrombin Time: 14.4 seconds (ref 11.6–15.2)

## 2014-12-29 LAB — TYPE AND SCREEN
ABO/RH(D): O POS
Antibody Screen: NEGATIVE

## 2014-12-29 LAB — ABO/RH: ABO/RH(D): O POS

## 2014-12-29 LAB — APTT: aPTT: 44 seconds — ABNORMAL HIGH (ref 24–37)

## 2014-12-29 SURGERY — BRONCHOSCOPY, WITH EBUS
Anesthesia: General

## 2014-12-29 MED ORDER — LIDOCAINE HCL (CARDIAC) 20 MG/ML IV SOLN
INTRAVENOUS | Status: AC
  Filled 2014-12-29: qty 5

## 2014-12-29 MED ORDER — ARTIFICIAL TEARS OP OINT
TOPICAL_OINTMENT | OPHTHALMIC | Status: AC
  Filled 2014-12-29: qty 3.5

## 2014-12-29 MED ORDER — FENTANYL CITRATE (PF) 100 MCG/2ML IJ SOLN
25.0000 ug | INTRAMUSCULAR | Status: DC | PRN
  Administered 2014-12-29 (×4): 25 ug via INTRAVENOUS

## 2014-12-29 MED ORDER — ROCURONIUM BROMIDE 50 MG/5ML IV SOLN
INTRAVENOUS | Status: AC
  Filled 2014-12-29: qty 1

## 2014-12-29 MED ORDER — ARTIFICIAL TEARS OP OINT
TOPICAL_OINTMENT | OPHTHALMIC | Status: DC | PRN
  Administered 2014-12-29: 1 via OPHTHALMIC

## 2014-12-29 MED ORDER — HEMOSTATIC AGENTS (NO CHARGE) OPTIME
TOPICAL | Status: DC | PRN
  Administered 2014-12-29: 1 via TOPICAL

## 2014-12-29 MED ORDER — ROCURONIUM BROMIDE 100 MG/10ML IV SOLN
INTRAVENOUS | Status: DC | PRN
  Administered 2014-12-29: 50 mg via INTRAVENOUS

## 2014-12-29 MED ORDER — EPINEPHRINE HCL 1 MG/ML IJ SOLN
INTRAMUSCULAR | Status: AC
  Filled 2014-12-29: qty 1

## 2014-12-29 MED ORDER — KETOROLAC TROMETHAMINE 30 MG/ML IJ SOLN
INTRAMUSCULAR | Status: AC
  Filled 2014-12-29: qty 1

## 2014-12-29 MED ORDER — NEOSTIGMINE METHYLSULFATE 10 MG/10ML IV SOLN
INTRAVENOUS | Status: AC
  Filled 2014-12-29: qty 1

## 2014-12-29 MED ORDER — OXYCODONE HCL 5 MG PO TABS
ORAL_TABLET | ORAL | Status: AC
  Filled 2014-12-29: qty 1

## 2014-12-29 MED ORDER — PROPOFOL 10 MG/ML IV BOLUS
INTRAVENOUS | Status: DC | PRN
  Administered 2014-12-29: 200 mg via INTRAVENOUS
  Administered 2014-12-29: 30 mg via INTRAVENOUS

## 2014-12-29 MED ORDER — VECURONIUM BROMIDE 10 MG IV SOLR
INTRAVENOUS | Status: DC | PRN
  Administered 2014-12-29 (×2): 2 mg via INTRAVENOUS
  Administered 2014-12-29: 1 mg via INTRAVENOUS

## 2014-12-29 MED ORDER — PROPOFOL 10 MG/ML IV BOLUS
INTRAVENOUS | Status: AC
  Filled 2014-12-29: qty 20

## 2014-12-29 MED ORDER — DEXAMETHASONE SODIUM PHOSPHATE 4 MG/ML IJ SOLN
INTRAMUSCULAR | Status: DC | PRN
  Administered 2014-12-29: 4 mg via INTRAVENOUS

## 2014-12-29 MED ORDER — OXYCODONE HCL 5 MG PO TABS
5.0000 mg | ORAL_TABLET | ORAL | Status: DC | PRN
  Administered 2014-12-29: 5 mg via ORAL

## 2014-12-29 MED ORDER — 0.9 % SODIUM CHLORIDE (POUR BTL) OPTIME
TOPICAL | Status: DC | PRN
  Administered 2014-12-29 (×2): 1000 mL

## 2014-12-29 MED ORDER — ONDANSETRON HCL 4 MG/2ML IJ SOLN
INTRAMUSCULAR | Status: DC | PRN
  Administered 2014-12-29: 4 mg via INTRAVENOUS

## 2014-12-29 MED ORDER — MIDAZOLAM HCL 2 MG/2ML IJ SOLN
INTRAMUSCULAR | Status: AC
  Administered 2014-12-29 (×2): 2 mg via INTRAVENOUS
  Filled 2014-12-29: qty 2

## 2014-12-29 MED ORDER — FENTANYL CITRATE (PF) 250 MCG/5ML IJ SOLN
INTRAMUSCULAR | Status: AC
  Filled 2014-12-29: qty 5

## 2014-12-29 MED ORDER — LACTATED RINGERS IV SOLN
INTRAVENOUS | Status: DC
  Administered 2014-12-29 (×3): via INTRAVENOUS

## 2014-12-29 MED ORDER — MIDAZOLAM HCL 2 MG/2ML IJ SOLN
INTRAMUSCULAR | Status: AC
  Filled 2014-12-29: qty 2

## 2014-12-29 MED ORDER — ONDANSETRON HCL 4 MG/2ML IJ SOLN
INTRAMUSCULAR | Status: AC
  Filled 2014-12-29: qty 2

## 2014-12-29 MED ORDER — FENTANYL CITRATE (PF) 100 MCG/2ML IJ SOLN
INTRAMUSCULAR | Status: AC
  Filled 2014-12-29: qty 2

## 2014-12-29 MED ORDER — PROMETHAZINE HCL 25 MG/ML IJ SOLN: 6.2500 mg | INTRAMUSCULAR | Status: DC | PRN

## 2014-12-29 MED ORDER — KETOROLAC TROMETHAMINE 30 MG/ML IJ SOLN
30.0000 mg | Freq: Once | INTRAMUSCULAR | Status: AC | PRN
  Administered 2014-12-29: 30 mg via INTRAVENOUS

## 2014-12-29 MED ORDER — NEOSTIGMINE METHYLSULFATE 10 MG/10ML IV SOLN
INTRAVENOUS | Status: DC | PRN
  Administered 2014-12-29: 4 mg via INTRAVENOUS

## 2014-12-29 MED ORDER — FENTANYL CITRATE (PF) 100 MCG/2ML IJ SOLN
INTRAMUSCULAR | Status: AC
  Administered 2014-12-29 (×3): 50 ug via INTRAVENOUS
  Administered 2014-12-29: 100 ug via INTRAVENOUS
  Administered 2014-12-29: 50 ug via INTRAVENOUS
  Filled 2014-12-29: qty 2

## 2014-12-29 MED ORDER — GLYCOPYRROLATE 0.2 MG/ML IJ SOLN
INTRAMUSCULAR | Status: DC | PRN
  Administered 2014-12-29: 0.6 mg via INTRAVENOUS

## 2014-12-29 MED ORDER — GLYCOPYRROLATE 0.2 MG/ML IJ SOLN
INTRAMUSCULAR | Status: AC
  Filled 2014-12-29: qty 3

## 2014-12-29 SURGICAL SUPPLY — 66 items
ADH SKN CLS APL DERMABOND .7 (GAUZE/BANDAGES/DRESSINGS) ×1
BALL CTTN LRG ABS STRL LF (GAUZE/BANDAGES/DRESSINGS)
BLADE SURG 10 STRL SS (BLADE) ×3 IMPLANT
BLADE SURG 15 STRL LF DISP TIS (BLADE) ×1 IMPLANT
BLADE SURG 15 STRL SS (BLADE) ×3
BRUSH CYTOL CELLEBRITY 1.5X140 (MISCELLANEOUS) IMPLANT
CANISTER SUCTION 2500CC (MISCELLANEOUS) ×6 IMPLANT
CLIP TI MEDIUM 24 (CLIP) ×3 IMPLANT
CLIP TI MEDIUM 6 (CLIP) IMPLANT
CLIP TI WIDE RED SMALL 6 (CLIP) IMPLANT
CONT SPEC 4OZ CLIKSEAL STRL BL (MISCELLANEOUS) ×15 IMPLANT
COTTONBALL LRG STERILE PKG (GAUZE/BANDAGES/DRESSINGS) IMPLANT
COVER SURGICAL LIGHT HANDLE (MISCELLANEOUS) ×6 IMPLANT
COVER TABLE BACK 60X90 (DRAPES) ×3 IMPLANT
DERMABOND ADVANCED (GAUZE/BANDAGES/DRESSINGS) ×2
DERMABOND ADVANCED .7 DNX12 (GAUZE/BANDAGES/DRESSINGS) ×1 IMPLANT
DRAPE LAPAROTOMY T 102X78X121 (DRAPES) ×3 IMPLANT
DRSG AQUACEL AG ADV 3.5X14 (GAUZE/BANDAGES/DRESSINGS) IMPLANT
ELECT CAUTERY BLADE 6.4 (BLADE) ×3 IMPLANT
ELECT REM PT RETURN 9FT ADLT (ELECTROSURGICAL) ×3
ELECTRODE REM PT RTRN 9FT ADLT (ELECTROSURGICAL) ×1 IMPLANT
FORCEPS BIOP RJ4 1.8 (CUTTING FORCEPS) IMPLANT
GAUZE SPONGE 4X4 12PLY STRL (GAUZE/BANDAGES/DRESSINGS) ×3 IMPLANT
GAUZE SPONGE 4X4 16PLY XRAY LF (GAUZE/BANDAGES/DRESSINGS) ×3 IMPLANT
GLOVE BIO SURGEON STRL SZ7.5 (GLOVE) ×9 IMPLANT
GOWN STRL REUS W/ TWL LRG LVL3 (GOWN DISPOSABLE) ×2 IMPLANT
GOWN STRL REUS W/TWL LRG LVL3 (GOWN DISPOSABLE) ×6
HEMOSTAT SURGICEL 2X14 (HEMOSTASIS) ×3 IMPLANT
KIT BASIN OR (CUSTOM PROCEDURE TRAY) ×3 IMPLANT
KIT CLEAN ENDO COMPLIANCE (KITS) ×6 IMPLANT
KIT ROOM TURNOVER OR (KITS) ×6 IMPLANT
MARKER SKIN DUAL TIP RULER LAB (MISCELLANEOUS) ×3 IMPLANT
NEEDLE 22X1 1/2 (OR ONLY) (NEEDLE) IMPLANT
NEEDLE BIOPSY TRANSBRONCH 21G (NEEDLE) IMPLANT
NEEDLE BLUNT 18X1 FOR OR ONLY (NEEDLE) IMPLANT
NEEDLE SYS SONOTIP II EBUSTBNA (NEEDLE) ×3 IMPLANT
NS IRRIG 1000ML POUR BTL (IV SOLUTION) ×6 IMPLANT
OIL SILICONE PENTAX (PARTS (SERVICE/REPAIRS)) ×3 IMPLANT
PACK SURGICAL SETUP 50X90 (CUSTOM PROCEDURE TRAY) ×3 IMPLANT
PAD ARMBOARD 7.5X6 YLW CONV (MISCELLANEOUS) ×12 IMPLANT
PENCIL BUTTON HOLSTER BLD 10FT (ELECTRODE) ×3 IMPLANT
SOLUTION ANTI FOG 6CC (MISCELLANEOUS) ×3 IMPLANT
SPONGE INTESTINAL PEANUT (DISPOSABLE) ×3 IMPLANT
STAPLER VISISTAT 35W (STAPLE) IMPLANT
SUT SILK 2 0 TIES 10X30 (SUTURE) IMPLANT
SUT VIC AB 2-0 CT1 27 (SUTURE) ×3
SUT VIC AB 2-0 CT1 TAPERPNT 27 (SUTURE) ×1 IMPLANT
SUT VIC AB 3-0 SH 27 (SUTURE) ×3
SUT VIC AB 3-0 SH 27X BRD (SUTURE) ×1 IMPLANT
SUT VIC AB 3-0 SH 8-18 (SUTURE) ×6 IMPLANT
SUT VIC AB 3-0 X1 27 (SUTURE) ×3 IMPLANT
SWAB COLLECTION DEVICE MRSA (MISCELLANEOUS) IMPLANT
SYR 20CC LL (SYRINGE) ×3 IMPLANT
SYR 20ML ECCENTRIC (SYRINGE) ×3 IMPLANT
SYR 5ML LUER SLIP (SYRINGE) ×3 IMPLANT
SYR CONTROL 10ML LL (SYRINGE) IMPLANT
SYRINGE 10CC LL (SYRINGE) ×3 IMPLANT
TOWEL OR 17X24 6PK STRL BLUE (TOWEL DISPOSABLE) ×3 IMPLANT
TOWEL OR 17X26 10 PK STRL BLUE (TOWEL DISPOSABLE) ×6 IMPLANT
TRAP SPECIMEN MUCOUS 40CC (MISCELLANEOUS) ×3 IMPLANT
TUBE ANAEROBIC SPECIMEN COL (MISCELLANEOUS) IMPLANT
TUBE CONNECTING 12'X1/4 (SUCTIONS) ×1
TUBE CONNECTING 12X1/4 (SUCTIONS) ×2 IMPLANT
TUBE CONNECTING 20'X1/4 (TUBING) ×1
TUBE CONNECTING 20X1/4 (TUBING) ×2 IMPLANT
WATER STERILE IRR 1000ML POUR (IV SOLUTION) ×3 IMPLANT

## 2014-12-29 NOTE — Anesthesia Procedure Notes (Signed)
Procedure Name: Intubation Date/Time: 12/29/2014 12:06 PM Performed by: Roney MansSMITH, Helina Hullum P Pre-anesthesia Checklist: Patient identified, Timeout performed, Emergency Drugs available, Suction available and Patient being monitored Patient Re-evaluated:Patient Re-evaluated prior to inductionOxygen Delivery Method: Circle system utilized Preoxygenation: Pre-oxygenation with 100% oxygen Intubation Type: IV induction Ventilation: Mask ventilation without difficulty Laryngoscope Size: Mac and 3 Grade View: Grade I Tube type: Oral Tube size: 9.0 mm Number of attempts: 1 Airway Equipment and Method: Stylet Placement Confirmation: ETT inserted through vocal cords under direct vision,  breath sounds checked- equal and bilateral and positive ETCO2 Secured at: 23 cm Tube secured with: Tape Dental Injury: Teeth and Oropharynx as per pre-operative assessment

## 2014-12-29 NOTE — Brief Op Note (Signed)
12/29/2014  2:46 PM  PATIENT:  Netta CedarsJoseph L Mentor  44 y.o. male  PRE-OPERATIVE DIAGNOSIS:  MEDIASTINAL ADENOPATHY  POST-OPERATIVE DIAGNOSIS:  MEDIASTINAL ADENOPATHY  PROCEDURE:  Procedure(s): VIDEO BRONCHOSCOPY WITH ENDOBRONCHIAL ULTRASOUND (N/A) MEDIASTINOSCOPY (N/A)  SURGEON:  Surgeon(s) and Role:    * Kerin PernaPeter Van Trigt, MD - Primary  PHYSICIAN ASSISTANT:   ASSISTANTS: none   ANESTHESIA:   general  EBL:  Total I/O In: 2000 [I.V.:2000] Out: -   BLOOD ADMINISTERED:none  DRAINS: none   LOCAL MEDICATIONS USED:  NONE  SPECIMEN:  Biopsy / Limited Resection                              2 R mediastinal lymph node DISPOSITION OF SPECIMEN:  PATHOLOGY  COUNTS:  YES  TOURNIQUET:  * No tourniquets in log *  DICTATION: .Dragon Dictation  PLAN OF CARE: Discharge to home after PACU  PATIENT DISPOSITION:  PACU - hemodynamically stable.   Delay start of Pharmacological VTE agent (>24hrs) due to surgical blood loss or risk of bleeding: yes

## 2014-12-29 NOTE — Anesthesia Postprocedure Evaluation (Signed)
  Anesthesia Post-op Note  Patient: Chase Mcclure  Procedure(s) Performed: Procedure(s) (LRB): VIDEO BRONCHOSCOPY WITH ENDOBRONCHIAL ULTRASOUND (N/A) MEDIASTINOSCOPY (N/A)  Patient Location: PACU  Anesthesia Type: General  Level of Consciousness: awake and alert   Airway and Oxygen Therapy: Patient Spontanous Breathing  Post-op Pain: mild  Post-op Assessment: Post-op Vital signs reviewed, Patient's Cardiovascular Status Stable, Respiratory Function Stable, Patent Airway and No signs of Nausea or vomiting  Last Vitals:  Filed Vitals:   12/29/14 0912  BP: 166/85  Pulse: 69  Temp: 36.7 C  Resp: 20    Post-op Vital Signs: stable   Complications: No apparent anesthesia complications

## 2014-12-29 NOTE — Transfer of Care (Signed)
Immediate Anesthesia Transfer of Care Note  Patient: Chase Mcclure  Procedure(s) Performed: Procedure(s): VIDEO BRONCHOSCOPY WITH ENDOBRONCHIAL ULTRASOUND (N/A) MEDIASTINOSCOPY (N/A)  Patient Location: PACU  Anesthesia Type:General  Level of Consciousness: awake, alert , oriented and patient cooperative  Airway & Oxygen Therapy: Patient Spontanous Breathing and Patient connected to nasal cannula oxygen  Post-op Assessment: Report given to RN and Post -op Vital signs reviewed and stable  Post vital signs: Reviewed and stable  Last Vitals:  Filed Vitals:   12/29/14 0912  BP: 166/85  Pulse: 69  Temp: 36.7 C  Resp: 20    Complications: No apparent anesthesia complications

## 2014-12-29 NOTE — Progress Notes (Signed)
The patient was examined and preop studies reviewed. There has been no change from the prior exam and the patient is ready for surgery. Plan EBUS and mediastinoscopy biopsy of mediastinal nodes on J Tyner

## 2014-12-29 NOTE — Anesthesia Preprocedure Evaluation (Signed)
Anesthesia Evaluation  Patient identified by MRN, date of birth, ID band Patient awake    Reviewed: Allergy & Precautions, NPO status , Patient's Chart, lab work & pertinent test results  Airway Mallampati: II  TM Distance: >3 FB Neck ROM: Full    Dental no notable dental hx.    Pulmonary neg pulmonary ROS, former smoker,  breath sounds clear to auscultation  Pulmonary exam normal       Cardiovascular hypertension, negative cardio ROS  Rhythm:Regular Rate:Normal     Neuro/Psych negative neurological ROS  negative psych ROS   GI/Hepatic negative GI ROS, Neg liver ROS,   Endo/Other  negative endocrine ROS  Renal/GU negative Renal ROS  negative genitourinary   Musculoskeletal negative musculoskeletal ROS (+)   Abdominal   Peds negative pediatric ROS (+)  Hematology negative hematology ROS (+)   Anesthesia Other Findings   Reproductive/Obstetrics negative OB ROS                             Anesthesia Physical Anesthesia Plan  ASA: II  Anesthesia Plan: General   Post-op Pain Management:    Induction: Intravenous  Airway Management Planned: Oral ETT  Additional Equipment: Arterial line  Intra-op Plan:   Post-operative Plan: Extubation in OR  Informed Consent: I have reviewed the patients History and Physical, chart, labs and discussed the procedure including the risks, benefits and alternatives for the proposed anesthesia with the patient or authorized representative who has indicated his/her understanding and acceptance.   Dental advisory given  Plan Discussed with: CRNA  Anesthesia Plan Comments: (A line on L)        Anesthesia Quick Evaluation

## 2014-12-30 ENCOUNTER — Other Ambulatory Visit: Payer: Self-pay | Admitting: *Deleted

## 2014-12-30 ENCOUNTER — Encounter (HOSPITAL_COMMUNITY): Payer: Self-pay | Admitting: Cardiothoracic Surgery

## 2014-12-30 DIAGNOSIS — R590 Localized enlarged lymph nodes: Secondary | ICD-10-CM

## 2014-12-30 NOTE — Op Note (Signed)
NAME:  Chase Mcclure, Chase Mcclure NO.:  [id number]  MEDICAL RECORD NO.:  [id number]  LOCATION:  [facility]                         FACILITY:  [facility]  PHYSICIAN:  Kerin Perna, M.D.  DATE OF BIRTH:  [birth year]  DATE OF PROCEDURE:  12/29/2014 DATE OF DISCHARGE:  12/29/2014                              OPERATIVE REPORT   OPERATION: 1. Video bronchoscopy. 2. EBUS - endobronchial ultrasound-directed biopsy of precarinal lymph     nodes. 3. Mediastinoscopy and biopsy of 2R lymph node.  SURGEON:  Kerin Perna, M.D.  ANESTHESIA:  General.  PREOPERATIVE DIAGNOSIS:  Diffuse mediastinal peribronchial and hepatoportal abdominal lymphadenopathy with mild activity on PET scan - sarcoidosis versus lymphoma.  POSTOPERATIVE DIAGNOSIS:  Diffuse mediastinal peribronchial and hepatoportal abdominal lymphadenopathy with mild activity on PET scan - sarcoidosis versus lymphoma.  OPERATIVE FINDINGS: 1. Transbronchial aspirates of the precarinal lymph nodes were     nondiagnostic, so mediastinoscopy was performed. 2. 2R lymph node removed at mediastinoscopy was sent for frozen     showing necrotizing granuloma consistent with sarcoidosis.  Final     path pending.  DESCRIPTION OF PROCEDURE:  After the patient had been examined in the office and informed consent obtained for EBUS and mediastinoscopy, the patient was brought to the operating room and placed supine on the operating table where general anesthesia was induced.  A proper time-out was performed.  The bronchoscope was passed on the ET tube.  The distal trachea and carina were clear and sharp.  The mainstem bronchus to the left lung was examined as were the endobronchial segments of the left upper lobe and left lower lobe.  There were no endobronchial lesions or excessive secretions.  The bronchoscope was then passed on the right mainstem bronchus.  The endobronchial segments of the right upper lobe, right middle lobe, and right  lower lobes were all examined and were without abnormality or significant secretions.  The bronchoscope was withdrawn.  The EBUS scope was then passed.  The precarinal lymph nodes in the 4 L station were observed that were close to the vascular structures including the aorta and pulmonary artery.  Several passes with the transbronchial needle under ultrasound guidance were obtained and the aspirates were sent for quick prep cytology.  Further passes were sent for permanent cytology.  The analysis of the quick prep was nondiagnostic.  We then proceed with a mediastinoscopy.  The patient was re-prepped and draped as a sterile field.  A proper time-out was repeated.  A small incision was made in the suprasternal notch.  Dissection was carried down to the pretracheal plane.  A plane of dissection was developed with the mediastinoscope underneath the innominate vessels.  Lymph nodes were identified in the 2R station.  One lymph node was removed in its entirety.  There was minimal bleeding.  This was felt to be a good specimen.  The incision was then closed in layers using Vicryl for the subcutaneous layer and a running subcuticular for the skin.  Sterile dressing was applied.  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  The patient returned to the recovery room in stable condition.  Kerin PernaPeter Van Trigt, M.D.     PV/MEDQ  D:  12/29/2014  T:  12/30/2014  Job:  [id number]  cc:   Lajuana MatteMohamed K Mohamed, M.D.

## 2014-12-31 ENCOUNTER — Telehealth: Payer: Self-pay | Admitting: Internal Medicine

## 2014-12-31 ENCOUNTER — Encounter (HOSPITAL_COMMUNITY): Payer: Self-pay | Admitting: Cardiothoracic Surgery

## 2014-12-31 ENCOUNTER — Other Ambulatory Visit: Payer: Self-pay | Admitting: *Deleted

## 2014-12-31 NOTE — Telephone Encounter (Signed)
s.w. patient and confirmed per MD that pt did not need any further appt here at [facility] and only needed to see dr. Morton PetersVan Tright.

## 2015-01-06 ENCOUNTER — Ambulatory Visit (INDEPENDENT_AMBULATORY_CARE_PROVIDER_SITE_OTHER): Payer: PRIVATE HEALTH INSURANCE | Admitting: Cardiothoracic Surgery

## 2015-01-06 ENCOUNTER — Encounter: Payer: Self-pay | Admitting: Cardiothoracic Surgery

## 2015-01-06 VITALS — BP 136/94 | HR 90 | Resp 20 | Ht 71.0 in | Wt 215.0 lb

## 2015-01-06 DIAGNOSIS — D860 Sarcoidosis of lung: Secondary | ICD-10-CM

## 2015-01-06 DIAGNOSIS — Z9889 Other specified postprocedural states: Secondary | ICD-10-CM

## 2015-01-06 DIAGNOSIS — R590 Localized enlarged lymph nodes: Secondary | ICD-10-CM

## 2015-01-06 DIAGNOSIS — R599 Enlarged lymph nodes, unspecified: Secondary | ICD-10-CM

## 2015-01-06 NOTE — Progress Notes (Signed)
PCP is Mickie HillierLITTLE,KEVIN LORNE, MD Referring Provider is Si GaulMohamed, Mohamed, MD  Chief Complaint  Patient presents with  . Routine Post Op    1 week f/u to discuss BX results, s/p EBUS, Mediastinoscopy and biopsy 12/29/14    ZOX:WRUEAVHPI:postop followup -wound check status post mediastinoscopy and  lymph node biopsy Patient was evaluated for hilar adenopathy mediastinal adenopathy and mesenteric adenopathy Large peritracheal node removed and pathology consistent with sarcoidosis Patient has appointment with pulmonary medicine Dr. Shelle Ironlance in 2 weeks and is followed by Dr. Wallace CullensGray, rheumatology Minimal neck pain after mediastinoscopy and incision is healed well, Steri-Strips are removed   Past Medical History  Diagnosis Date  . Celiac disease   . Hypertension     diet and exercise controlled.  Also has white coat syndrome.  Marland Kitchen. Shortness of breath dyspnea     recently  . Anxiety   . Diverticulitis     per PET scan asymptomatic    Past Surgical History  Procedure Laterality Date  . Vasectomy    . Trauma surgery       punctured lung lacerated spleen   . Inguinal hernia repair Right 04/30/2014    Procedure: LAPAROSCOPIC RIGHT  INGUINAL HERNIA REPAIR ;  Surgeon: Shelly Rubensteinouglas A Blackman, MD;  Location: [facility];  Service: General;  Laterality: Right;  . Insertion of mesh N/A 04/30/2014    Procedure: INSERTION OF MESH;  Surgeon: Shelly Rubensteinouglas A Blackman, MD;  Location: [facility];  Service: General;  Laterality: N/A;  . Colonoscopy    . Hernia repair Left 2000ish  . Video bronchoscopy with endobronchial ultrasound N/A 12/29/2014    Procedure: VIDEO BRONCHOSCOPY WITH ENDOBRONCHIAL ULTRASOUND;  Surgeon: Kerin PernaPeter Van Trigt, MD;  [facility];  Service: Thoracic;  Laterality: N/A;  . Mediastinoscopy N/A 12/29/2014    Procedure: MEDIASTINOSCOPY;  Surgeon: Kerin PernaPeter Van Trigt, MD;  [facility];  Service: Thoracic;  Laterality: N/A;    No family history on file.  Social History History  Substance Use Topics  . Smoking  status: Former Smoker -- 0.25 packs/day for 1 years    Types: Cigarettes    Quit date: 09/04/1988  . Smokeless tobacco: Former NeurosurgeonUser    Types: Chew    Quit date: 09/04/1998  . Alcohol Use: 1.2 oz/week    2 Glasses of wine per week     Comment: occasional during the week     Current Outpatient Prescriptions  Medication Sig Dispense Refill  . HYDROcodone-acetaminophen (NORCO/VICODIN) 5-325 MG per tablet 1 tablet every 6 (six) hours as needed.     . naproxen sodium (ANAPROX) 220 MG tablet Take 440 mg by mouth 2 (two) times daily as needed (pain). Aleve     No current facility-administered medications for this visit.    Allergies  Allergen Reactions  . Gluten Meal Other (See Comments)    Celiac disease    Review of Systems  Continues to lose weight Minimal pain No shortness of breath Swallowing without difficulty  no vision changes No fever  BP 136/94 mmHg  Pulse 90  Resp 20  Ht 5\' 11"  (1.803 m)  Wt 215 lb (97.523 kg)  BMI 30.00 kg/m2  SpO2 98% Physical Exam Alert and comfortable Lungs clear Neck incision healing well Heart rate, rhythm regular  Diagnostic Tests: No chest x-ray today  Impression: Patient recovering well after mediastinoscopy and biopsy Pathology reports provided  to the patient -no sign of malignancy. Nonnecrotizing caseous granuloma consistent with sarcoidosis  Plan:patient will be followed by rheumatology and  pulmonology --return here as needed   Chase BussingPeter Van Trigt III, MD [facility] [phone]

## 2015-01-07 ENCOUNTER — Other Ambulatory Visit: Payer: PRIVATE HEALTH INSURANCE

## 2015-01-07 ENCOUNTER — Ambulatory Visit: Payer: PRIVATE HEALTH INSURANCE | Admitting: Internal Medicine

## 2015-01-20 ENCOUNTER — Encounter: Payer: Self-pay | Admitting: Pulmonary Disease

## 2015-01-20 ENCOUNTER — Encounter (INDEPENDENT_AMBULATORY_CARE_PROVIDER_SITE_OTHER): Payer: Self-pay

## 2015-01-20 ENCOUNTER — Ambulatory Visit (INDEPENDENT_AMBULATORY_CARE_PROVIDER_SITE_OTHER): Payer: PRIVATE HEALTH INSURANCE | Admitting: Pulmonary Disease

## 2015-01-20 DIAGNOSIS — D869 Sarcoidosis, unspecified: Secondary | ICD-10-CM

## 2015-01-20 NOTE — Patient Instructions (Signed)
Will schedule for breathing studies to establish your baseline over the next 1-2 weeks. If your dry cough returns once you get off your prednisone, please call so we can consider a trial of inhaled steroids. Make sure your eye doctor knows that you have sarcoid. followup with Dr. Kendrick FriesMcQuaid in 6mos, but call if questions or problems.

## 2015-01-20 NOTE — Assessment & Plan Note (Signed)
The patient has a new diagnosis of sarcoidosis, primarily manifesting as mediastinal lymphadenopathy, severe joint complaints, and minimal dry cough and dyspnea. He has been started on prednisone by his rheumatologist, with significant improvement in his joint complaints. He is really not overly symptomatic from a pulmonary standpoint, and in fact would not treat him with steroids unless he has much more significant pulmonary symptoms. If he has a return of his dry cough after weaning off steroids, I would consider inhaled corticosteroids to treat this. I have told him that he is very likely to have a good outcome from this, given the fact that he is Caucasian, has stage I sarcoid from a chest x-ray standpoint, and had a very acute presentation. He will need to have baseline pulmonary function studies, and another follow-up here in approximately 6 months. Again, I do not think he needs aggressive therapy from a pulmonary standpoint, but may need this from a rheumatologic standpoint.

## 2015-01-20 NOTE — Progress Notes (Signed)
   Subjective:    Patient ID: Chase Mcclure, Chase Mcclure    DOB: Feb 03, 1971, 44 y.o.   MRN: [id number]  HPI The patient is a very pleasant 44 year old Chase Mcclure who I've been asked to see for management of pulmonary sarcoidosis. His usual state of health until recently when he began to develop acute joint pain. He was sent by his primary physician to rheumatology, where a chest x-ray ultimately showed mediastinal adenopathy. He underwent a CT chest that showed fairly diffuse mediastinal adenopathy, but really no significant parenchymal abnormality. He was seen by thoracic surgery, and underwent mediastinoscopy with the findings of nonnecrotizing granulomatous inflammation. Is for organisms were negative. The patient has subsequently started on prednisone at 40 mg a day through his rheumatologist, primarily to treat his joint complaints. His discomfort is much improved on the medication. He really has not had significant pulmonary symptoms, with only mild dyspnea on exertion above his usual baseline, as well as a dry cough that he describes as a tickle. He has had night sweats, but no visual changes.  He does not have any liver function test abnormalities on his recent blood work.   Review of Systems  Constitutional: Negative for fever and unexpected weight change.  HENT: Positive for congestion and postnasal drip. Negative for dental problem, ear pain, nosebleeds, rhinorrhea, sinus pressure, sneezing, sore throat and trouble swallowing.   Eyes: Negative for redness and itching.  Respiratory: Positive for cough, shortness of breath and wheezing. Negative for chest tightness.   Cardiovascular: Negative for palpitations and leg swelling.  Gastrointestinal: Negative for nausea and vomiting.  Genitourinary: Negative for dysuria.  Musculoskeletal: Negative for joint swelling.  Skin: Negative for rash.  Neurological: Negative for headaches.  Hematological: Does not bruise/bleed easily.  Psychiatric/Behavioral:  Negative for dysphoric mood. The patient is not nervous/anxious.        Objective:   Physical Exam Constitutional:  Well developed, no acute distress  HENT:  Nares patent without discharge  Oropharynx without exudate, palate and uvula are normal  Eyes:  Perrla, eomi, no scleral icterus  Neck:  No JVD, no TMG  Cardiovascular:  Normal rate, regular rhythm, no rubs or gallops.  No murmurs        Intact distal pulses  Pulmonary :  Normal breath sounds, no stridor or respiratory distress   No rales, rhonchi, or wheezing  Abdominal:  Soft, nondistended, bowel sounds present.  No tenderness noted.   Musculoskeletal:  No lower extremity edema noted.  Lymph Nodes:  No cervical lymphadenopathy noted  Skin:  No cyanosis noted  Neurologic:  Alert, appropriate, moves all 4 extremities without obvious deficit.         Assessment & Plan:

## 2015-02-03 ENCOUNTER — Ambulatory Visit (HOSPITAL_COMMUNITY)
Admission: RE | Admit: 2015-02-03 | Discharge: 2015-02-03 | Disposition: A | Discharge status: Home / Self Care | Payer: PRIVATE HEALTH INSURANCE | Source: Ambulatory Visit | Attending: Pulmonary Disease | Admitting: Pulmonary Disease

## 2015-02-03 DIAGNOSIS — D869 Sarcoidosis, unspecified: Secondary | ICD-10-CM | POA: Diagnosis present

## 2015-02-03 LAB — PULMONARY FUNCTION TEST
DL/VA % PRED: 111 %
DL/VA: 5.34 ml/min/mmHg/L
DLCO unc % pred: 97 %
DLCO unc: 34.07 ml/min/mmHg
FEF 25-75 POST: 3.16 L/s
FEF 25-75 Pre: 3.39 L/sec
FEF2575-%Change-Post: -6 %
FEF2575-%PRED-PRE: 88 %
FEF2575-%Pred-Post: 81 %
FEV1-%Change-Post: 1 %
FEV1-%PRED-POST: 79 %
FEV1-%Pred-Pre: 78 %
FEV1-Post: 3.44 L
FEV1-Pre: 3.37 L
FEV1FVC-%Change-Post: 0 %
FEV1FVC-%Pred-Pre: 101 %
FEV6-%Change-Post: 2 %
FEV6-%PRED-PRE: 79 %
FEV6-%Pred-Post: 82 %
FEV6-Post: 4.37 L
FEV6-Pre: 4.25 L
FEV6FVC-%PRED-POST: 103 %
FEV6FVC-%PRED-PRE: 103 %
FVC-%Change-Post: 2 %
FVC-%PRED-PRE: 77 %
FVC-%Pred-Post: 79 %
FVC-Post: 4.37 L
FVC-Pre: 4.25 L
Post FEV1/FVC ratio: 79 %
Post FEV6/FVC ratio: 100 %
Pre FEV1/FVC ratio: 79 %
Pre FEV6/FVC Ratio: 100 %
RV % pred: 74 %
RV: 1.54 L
TLC % PRED: 84 %
TLC: 6.21 L

## 2015-02-03 MED ORDER — ALBUTEROL SULFATE (2.5 MG/3ML) 0.083% IN NEBU
2.5000 mg | INHALATION_SOLUTION | Freq: Once | RESPIRATORY_TRACT | Status: AC
  Administered 2015-02-03: 2.5 mg via RESPIRATORY_TRACT

## 2015-02-09 ENCOUNTER — Telehealth: Payer: Self-pay | Admitting: Pulmonary Disease

## 2015-02-09 NOTE — Progress Notes (Signed)
Quick Note:  Called and spoke to pt. Informed him of the results and recs [facility]. Pt verbalized understanding and denied any further questions or concerns at this time. ______

## 2015-02-09 NOTE — Telephone Encounter (Signed)
Called and spoke to pt. In[facility] him of the [facility] and [facility]. Pt verbalized understanding and denied any further questions or concerns at this time.    Notes Recorded by Barbaraann ShareKeith M Clance, MD on 02/09/2015 at 8:47 AM Let pt know that his breathing studies are normal. Good news. Keep followup in 6mos with Dr. Kendrick FriesMcQuaid per the last visit.

## 2015-04-21 ENCOUNTER — Ambulatory Visit: Payer: PRIVATE HEALTH INSURANCE | Admitting: Pulmonary Disease

## 2017-03-09 IMAGING — CT NM PET TUM IMG INITIAL (PI) SKULL BASE T - THIGH
6 of 7 series · 19 of 25 positions shown · non-contrast
Comparison: No priors.

CLINICAL DATA: Initial treatment strategy for mediastinal
lymphadenopathy.

EXAM:
NUCLEAR MEDICINE PET SKULL BASE TO THIGH
TECHNIQUE: 12.5 mCi F-18 FDG was injected intravenously. Full-ring PET imaging
was performed from the skull base to thigh after the radiotracer. CT
data was obtained and used for attenuation correction and anatomic
localization.
FASTING BLOOD GLUCOSE:  Value: 93 mg/dl

[Series 3: pet sk_thigh ac · axial · 5.0mm · 4.07mm/px · z∈[-1421,-433]mm · 4 of 248 slices shown]
[im 1/248]
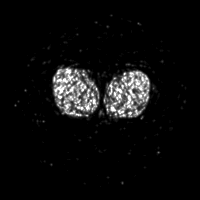
[im 62/248]
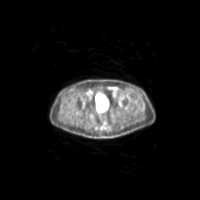
[im 186/248]
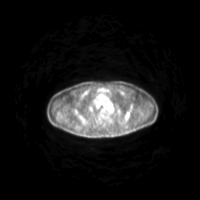
[im 248/248]
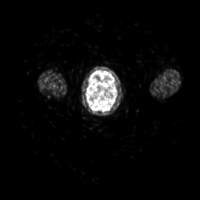

[Series 4: ct sk_thigh 5.0 hd_fov · axial · 5.0mm · 1.17mm/px · z∈[-1421,-433]mm · 4 of 248 slices shown]
[im 1/248]
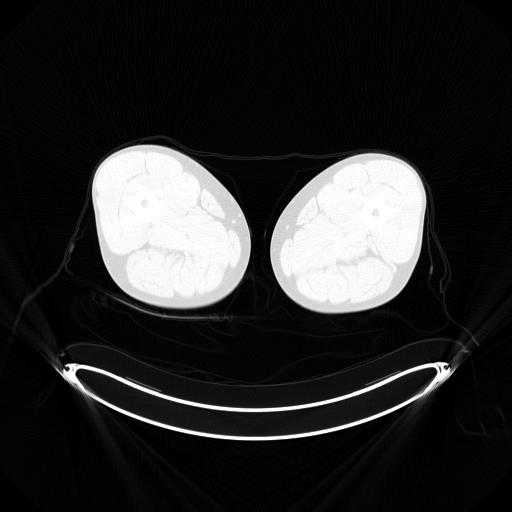
[im 124/248]
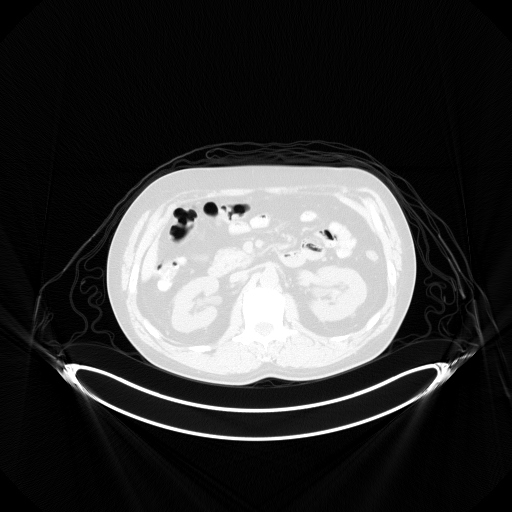
[im 186/248]
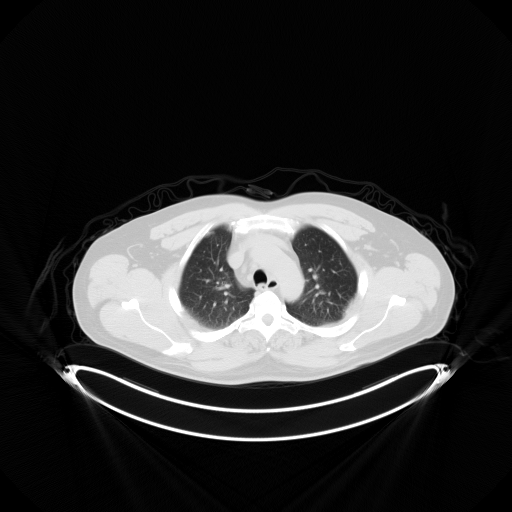
[im 248/248  brain]
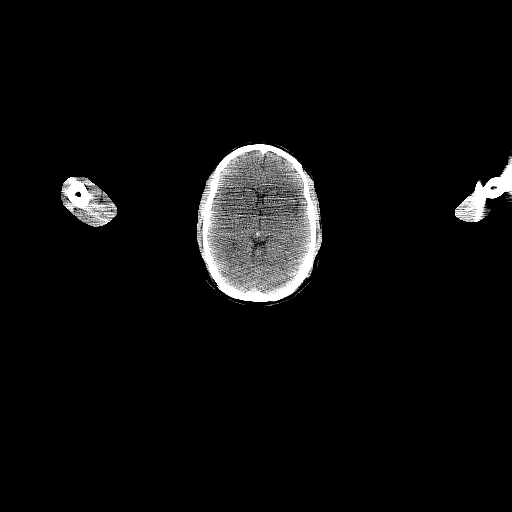

[Series 8: pet sk_thigh nac · axial · 5.0mm · 4.07mm/px · z∈[-1421,-433]mm · 4 of 248 slices shown]
[im 1/248]
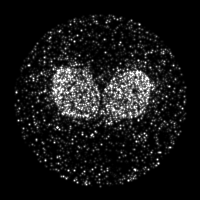
[im 62/248]
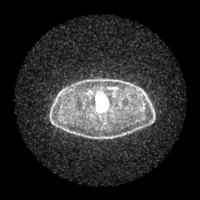
[im 124/248]
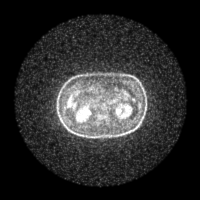
[im 248/248]
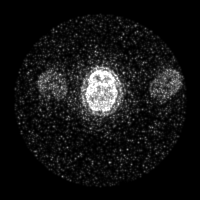

[Series 604: mip collection<mip range> · coronal · 2.05mm/px · 1 of 32 slices shown]
[im 1/32]
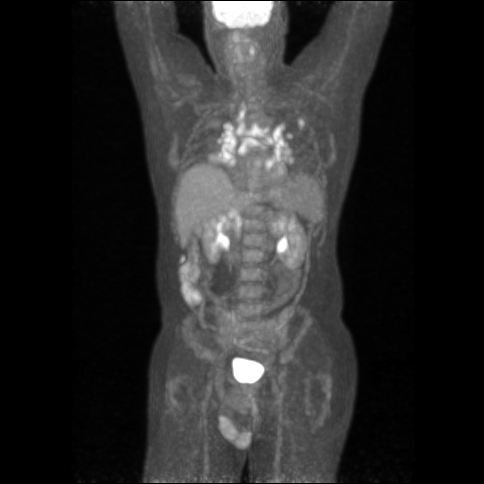

[Series 605: range-ct sk_thigh 5.0 hd_fov-cor-<mpr range> · coronal · 5.0mm · 0.97mm/px · 2 of 120 slices shown]
[im 1/120  full-range]
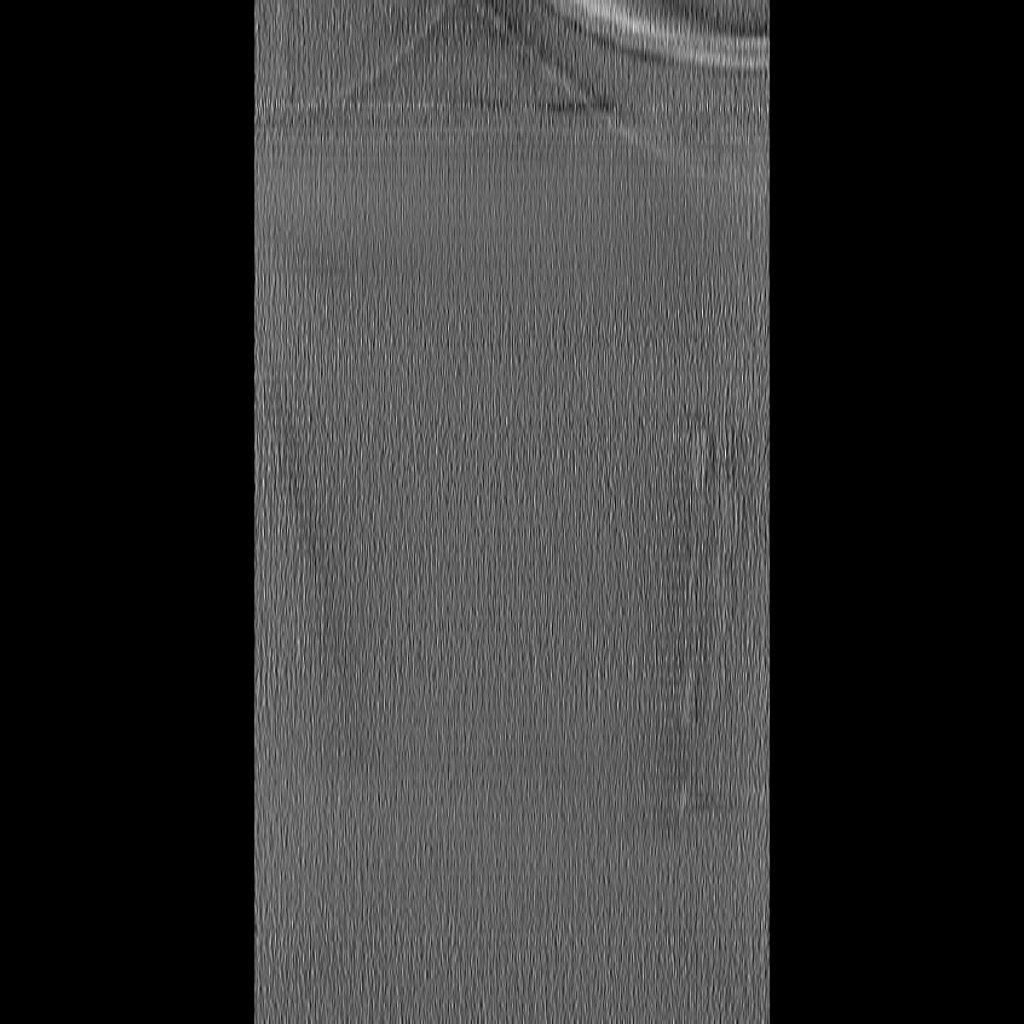
[im 120/120]
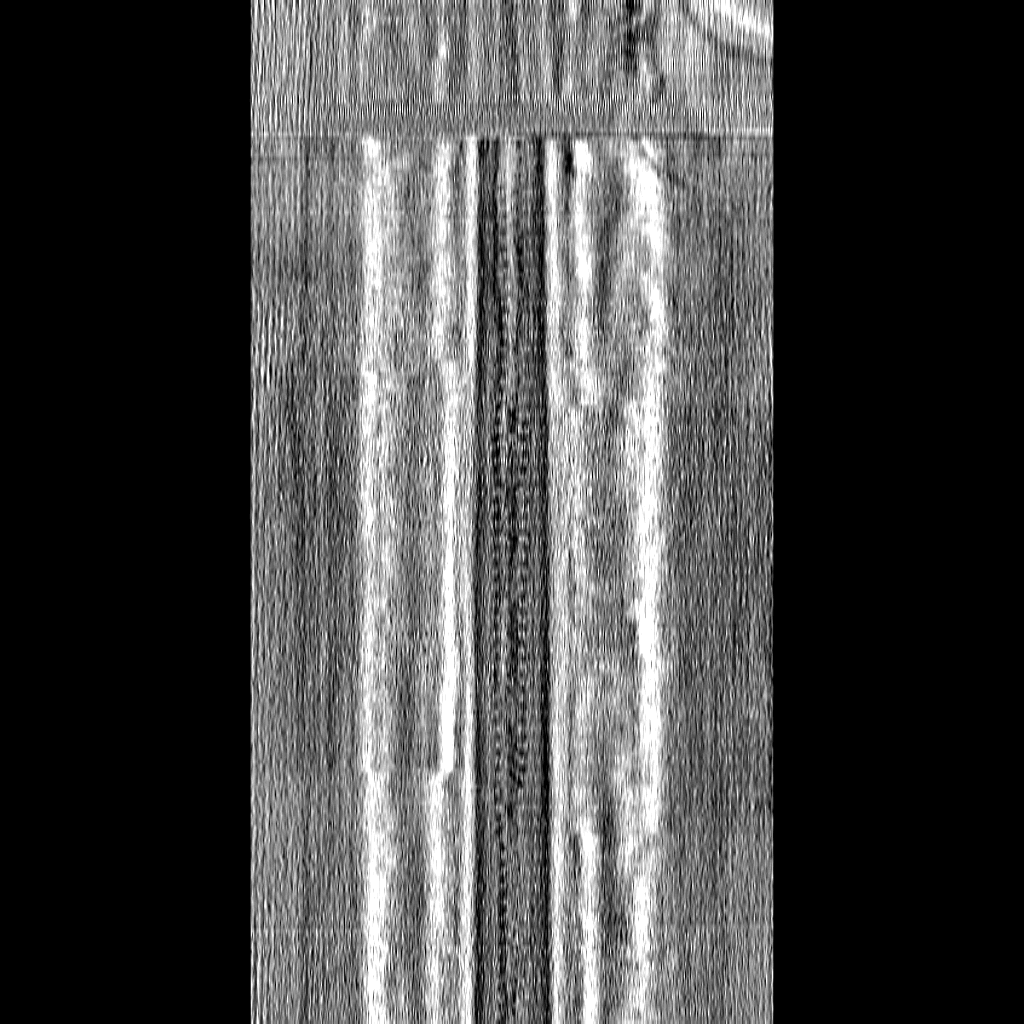

[Series 606: range-ct sk_thigh 5.0 hd_fov-tra-<alpha range> · 4 of 238 slices shown]
[im 1/238]
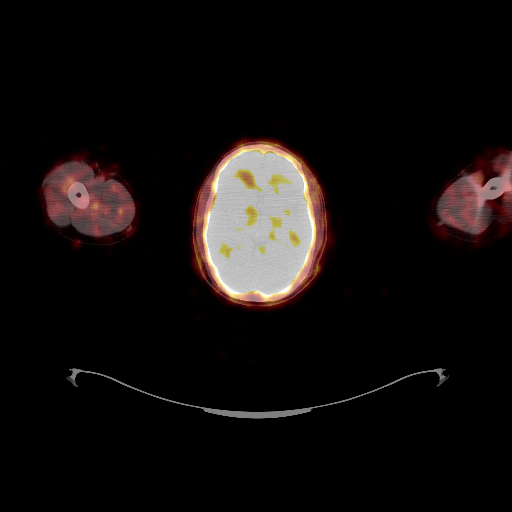
[im 60/238]
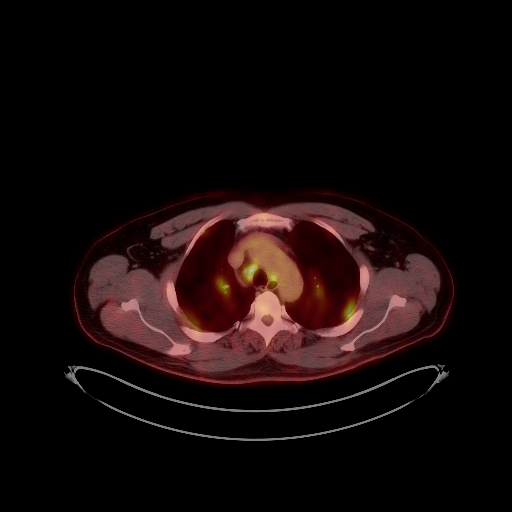
[im 178/238]
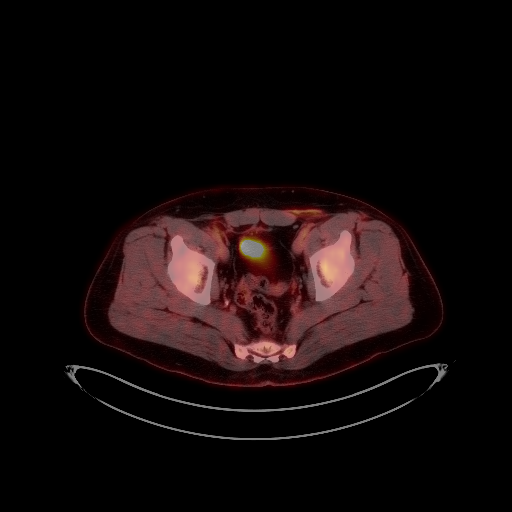
[im 238/238]
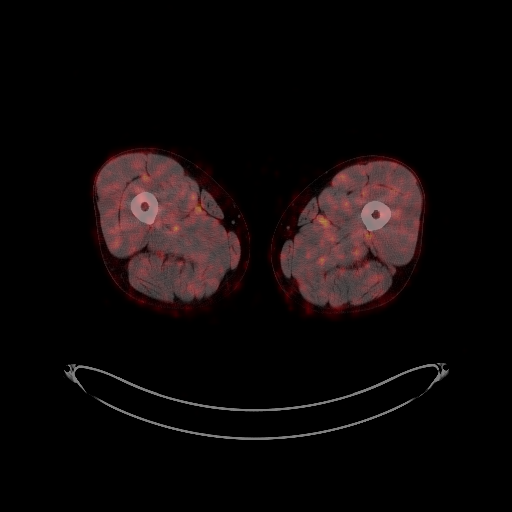

[19 of 25 positions shown; findings below may reference images not displayed]

FINDINGS: NECK

No hypermetabolic lymph nodes in the neck.

CHEST

Extensive hypermetabolic (SUVmax = 7.2-10.4) lymphadenopathy
throughout all aspects of the mediastinum and hilar nodal stations,
where there are numerous borderline enlarged and mildly enlarged
mediastinal and hilar lymph nodes. In the periphery of the left
upper lobe there is a plaque-like area of pleural thickening which
demonstrates hypermetabolism (SUVmax = 5.4)posterolaterally (image
20 of series 6). Subpleural nodule along the superior aspect of the
right major fissure measures 1 cm and is mildly hypermetabolic
(SUVmax = 3.1). No other larger more suspicious appearing pulmonary
nodules or masses are otherwise noted. No acute consolidative
airspace disease. No pleural effusions. Heart size is within normal
limits. Esophagus is unremarkable in appearance. No axillary
lymphadenopathy.

ABDOMEN/PELVIS

Multiple borderline enlarged and mildly enlarged hepatoduodenal
ligament and portacaval lymph nodes are noted demonstrating
hypermetabolism (SUVmax = 5.3-6.4), measuring up to 1.3 cm in short
axis. No abnormal hypermetabolic activity within the liver,
pancreas, adrenal glands, or spleen. Atrophy of the body and tail of
the pancreas. No significant volume of ascites. No pneumoperitoneum.
No pathologic distention of small bowel. Numerous colonic
diverticulae are noted throughout the sigmoid colon, without
surrounding inflammatory changes to suggest an acute diverticulitis
at this time. Normal appendix.

SKELETON

Tiny focus of hypermetabolism (SUVmax = 5.2)within the oblique
musculature of the right lateral abdominal wall, where there is a
very subtle contour abnormality of the musculature demonstrated on
image 135 of series 4. No focal hypermetabolic activity to suggest
skeletal metastasis.
IMPRESSION: 1. Extensive hypermetabolic lymphadenopathy most evident in the
mediastinal and hilar regions, but also in the hepatoduodenal
ligament and portacaval nodal stations in the upper abdomen. There
is also a small amount of perilymphatic nodularity in the lungs,
with a subpleural nodule along the right major fissure, and a
peripheral plaque-like area in the left upper lobe. Overall, the
spectrum of findings favors sarcoidosis, rather than lymphoma, but
clinical correlation is recommended. Tissue sampling may provide
additional diagnostic information if clinically appropriate.
2. In addition, there is a tiny focus of hypermetabolism within the
right oblique musculature which may represent a small intramuscular
focus of sarcoidosis.
3. Colonic diverticulosis without findings to suggest acute
diverticulitis at this time.
4. Additional incidental findings, as above.

## 2017-05-11 ENCOUNTER — Ambulatory Visit (INDEPENDENT_AMBULATORY_CARE_PROVIDER_SITE_OTHER): Payer: PRIVATE HEALTH INSURANCE | Admitting: Physician Assistant

## 2017-05-11 ENCOUNTER — Encounter: Payer: Self-pay | Admitting: Physician Assistant

## 2017-05-11 VITALS — BP 122/84 | HR 72 | Temp 98.6°F | Resp 14 | Ht 72.0 in | Wt 225.0 lb

## 2017-05-11 DIAGNOSIS — I10 Essential (primary) hypertension: Secondary | ICD-10-CM | POA: Diagnosis not present

## 2017-05-11 DIAGNOSIS — K900 Celiac disease: Secondary | ICD-10-CM | POA: Diagnosis not present

## 2017-05-11 DIAGNOSIS — Z8371 Family history of colonic polyps: Secondary | ICD-10-CM | POA: Diagnosis not present

## 2017-05-11 DIAGNOSIS — D860 Sarcoidosis of lung: Secondary | ICD-10-CM

## 2017-05-11 DIAGNOSIS — H9193 Unspecified hearing loss, bilateral: Secondary | ICD-10-CM | POA: Diagnosis not present

## 2017-05-11 DIAGNOSIS — G8929 Other chronic pain: Secondary | ICD-10-CM

## 2017-05-11 DIAGNOSIS — F411 Generalized anxiety disorder: Secondary | ICD-10-CM | POA: Diagnosis not present

## 2017-05-11 DIAGNOSIS — N529 Male erectile dysfunction, unspecified: Secondary | ICD-10-CM | POA: Diagnosis not present

## 2017-05-11 DIAGNOSIS — H6983 Other specified disorders of Eustachian tube, bilateral: Secondary | ICD-10-CM

## 2017-05-11 DIAGNOSIS — M25521 Pain in right elbow: Secondary | ICD-10-CM

## 2017-05-11 MED ORDER — OLMESARTAN MEDOXOMIL 20 MG PO TABS: 20.0000 mg | ORAL_TABLET | Freq: Every day | ORAL | 1 refills | Status: DC

## 2017-05-11 NOTE — Patient Instructions (Signed)
Please stop the losartan and start the Benicar daily. Watch salt intake. Stay well-hydrated. Follow-up with me in 2 weeks.  Restart Flonase and Claritin or Zyrtec daily. Continue Singulair.  We will be setting you up for hearing evaluation.   Please speak with Chase Mcclure at the front desk to schedule an appointment with Dr. Berline Choughigby.  Again follow-up in 2 weeks. We can do your physical at that time.

## 2017-05-11 NOTE — Progress Notes (Signed)
Pre visit review using our clinic review tool, if applicable. No additional management support is needed unless otherwise documented below in the visit note. 

## 2017-05-11 NOTE — Progress Notes (Signed)
Patient presents to clinic today to establish care.   Diet -- Keeping a very well-balanced diet, especially with celiac disease. Has completely stopped soda intake and any alcohol consumption.   Exercise -- Walks/runs when he can but no regular exercise regimen.  Patient has lost 20 pounds over the past several months.   Body mass index is 30.52 kg/m.  Generalized Anxiety Disorder -- Currently on lexapro 10 mg daily. Endorses taking as directed. Has helped some with anxiety. Denies depressed mood, SI/HI.   Hypertension -- Diagnosed multiple years ago. Significant family history of HTN, HLD. Is currently on a regimen of losartan 25 mg daily. Is taking as directed. Has never had to take medication for cholesterol. Patient denies chest pain, palpitations, lightheadedness, dizziness, vision changes or frequent headaches.  BP Readings from Last 3 Encounters:  05/11/17 122/84  01/20/15 132/78  01/06/15 (!) 136/94   + family hx colon polyps -- Father. Patient has had colonoscopy in 2015. Was diagnosed with diverticulosis. Negative for polyps per patient. Repeat screen at 48.   Allergies -- seasonal. Does take singulair for seasonal allergies. Also would add-on Zyrtec and Benadryl from time to time when flared. Also Flonase when having issues with Eustachian tube dysfunction.   Celiac Disease -- Well-controlled with gluten-free diet. Denies breakthrough symptoms.  Sarcoidosis -- Diagnosed in 2016. Patient noted flu-like syndrome that did not resolved. Was sent to Rheumatology where he had a CXR that revealed significant lymphadenopathy. Was sent to Oncology (Dr. Arbutus PedMohamed) and had PET scan revealing Sarcoidosis. Patient denies any current issues with fatigue, unexplainable weight loss or respiratory symptoms.  Vitamin D Deficiency -- Positive history per patient. Has never required prescription strength Vitamin D. Is currently on D3 2000 IU/day. .   Patient c/o slow deterioration in hearing.  Is having issue hearing certain tones. Has a hard time picking out words if there are multiple conversations in the room. Denies ear pain, tinnitus or drainage from ear.   Over the past 3-4 months has noted decline in erectile function -- notes difficulty maintaining erection. Patient denies dyspareunia, painful ejaculation. Denies hematuria, dysuria, frequency or urgency. Nocturia x 0-1. Has been getting yearly PSA levels that have been normal and unchanged per patient. Will get records for review.  Patient also notes chronic pain of R elbow over the past year. Pain is medial and in the joint per patient. No numbness, tingling, erythema, swelling. Denies known trauma or injury. Has had assessment previously with Orthopedics and was told the joint was fine. Patient rarely takes anything for pain but rates pain 5/10 most days. Denies similar symptoms of other joints. Denies myalgias.   Past Medical History:  Diagnosis Date  . Anxiety   . Celiac disease   . Diverticulitis    per PET scan asymptomatic  . Hypertension    diet and exercise controlled.  Also has white coat syndrome.  Marland Kitchen. Shortness of breath dyspnea    recently    No current outpatient prescriptions on file prior to visit.   No current facility-administered medications on file prior to visit.     Allergies  Allergen Reactions  . Gluten Meal Other (See Comments)    Celiac disease    Family History  Problem Relation Age of Onset  . Aneurysm Mother        brain  . Drug abuse Mother   . Rheum arthritis Father   . Prostate cancer Father [phone]  . Stroke Father [phone]  preceded bymultiple TIAs  . Hypertension Father   . Hearing loss Father   . Dementia Father   . Colon polyps Father        late 66s.  . Cancer Maternal Aunt        x2  . Hearing loss Brother   . Hypertension Brother   . Diabetes Daughter     Social History   Social History  . Marital status: Married    Spouse name: N/A  . Number of children: 3  .  Years of education: N/A   Occupational History  . commerical roofing    Social History Main Topics  . Smoking status: Former Smoker    Packs/day: 0.25    Years: 1.00    Types: Cigarettes    Quit date: 09/04/1988  . Smokeless tobacco: Former Neurosurgeon    Types: Chew    Quit date: 09/04/1998  . Alcohol use 1.2 oz/week    2 Glasses of wine per week     Comment: once a month  . Drug use: No  . Sexual activity: Yes   Other Topics Concern  . None   Social History Narrative  . None   Review of Systems - See HPI.  All other ROS are negative.  BP 122/84   Pulse 72   Temp 98.6 F (37 C) (Oral)   Resp 14   Ht 6' (1.829 m)   Wt 225 lb (102.1 kg)   SpO2 98%   BMI 30.52 kg/m   Physical Exam  Constitutional: He is oriented to person, place, and time and well-developed, well-nourished, and in no distress.  HENT:  Head: Normocephalic and atraumatic.  Right Ear: External ear normal.  Left Ear: External ear normal.  Nose: Nose normal.  Mouth/Throat: Oropharynx is clear and moist. No oropharyngeal exudate.  Serous fluid noted behind R TM without erythema, purulence, bulging or retraction  Eyes: Conjunctivae are normal.  Neck: Neck supple. No thyromegaly present.  Cardiovascular: Normal rate, regular rhythm, normal heart sounds and intact distal pulses.   Pulmonary/Chest: Effort normal and breath sounds normal. No respiratory distress. He has no wheezes. He has no rales. He exhibits no tenderness.  Genitourinary: Penis normal. No discharge found.  Musculoskeletal:       Right elbow: He exhibits normal range of motion and no swelling. No tenderness found.  Pain just superior to medial epicondyle with supination.  Neurological: He is alert and oriented to person, place, and time. No cranial nerve deficit.  Skin: Skin is warm and dry. No rash noted.  Psychiatric: Affect normal.   Assessment/Plan: Essential hypertension BP improved on recheck. Asymptomatic. Patient has significant  concerns that is Losartan is contributing to symptoms of ED. Will switch to a new ARB medication due to lower side effect profile. Will closely assess BP and ED symptoms. Follow-up scheduled.   Pulmonary sarcoidosis (HCC) Followed by Pulmonology and Oncology. Asymptomatic. Follow-up with specialists as scheduled.  Celiac disease Doing very well without breakthrough symptoms. Continue gluten-free diet.  Bilateral hearing loss Serous fluid noted. Eustachian tube dysfunction is a likely contributor but not likely sole cause. Referral to Audiology placed. Patient to start Flonase to help with fluid behind ears. OTC medications discussed.   Eustachian tube dysfunction, bilateral Continue singulair. Restart antihistamine and Flonase.  Erectile dysfunction Unclear etiology. 2 month history. Does not seem to be psychogenic. Patient with history of HTN but without history of HLD or DM. Is very active and eats a well-balanced diet overall. Is  on Lexapro but has taken for many years without any side effect. Concerned that Losartan is contributing. Discussed that this is a potential side effect although thankfully not one we commonly encounter. Will attempt swith to Benicar from Losartan to see if there is any improvement. Exam unremarkable today. If no improvement, will refer to Urology.  Chronic elbow pain, right Unclear etiology. Exam unremarkable except for pain in medial elbow with supination. Referral to Sports Medicine placed for further assessment. Appt has been scheduled.   Family history of colonic polyps Patient has had prior colonoscopy that was negative. Is due for screening colonoscopy at age 60.  GAD (generalized anxiety disorder) Doing very well on Lexapro. Will continue current regimen.     Piedad Climes, PA-C

## 2017-05-12 DIAGNOSIS — I10 Essential (primary) hypertension: Secondary | ICD-10-CM | POA: Insufficient documentation

## 2017-05-12 DIAGNOSIS — M25521 Pain in right elbow: Secondary | ICD-10-CM

## 2017-05-12 DIAGNOSIS — H6983 Other specified disorders of Eustachian tube, bilateral: Secondary | ICD-10-CM | POA: Insufficient documentation

## 2017-05-12 DIAGNOSIS — H9193 Unspecified hearing loss, bilateral: Secondary | ICD-10-CM | POA: Insufficient documentation

## 2017-05-12 DIAGNOSIS — Z8371 Family history of colonic polyps: Secondary | ICD-10-CM | POA: Insufficient documentation

## 2017-05-12 DIAGNOSIS — K900 Celiac disease: Secondary | ICD-10-CM | POA: Insufficient documentation

## 2017-05-12 DIAGNOSIS — F411 Generalized anxiety disorder: Secondary | ICD-10-CM | POA: Insufficient documentation

## 2017-05-12 DIAGNOSIS — N529 Male erectile dysfunction, unspecified: Secondary | ICD-10-CM | POA: Insufficient documentation

## 2017-05-12 DIAGNOSIS — G8929 Other chronic pain: Secondary | ICD-10-CM | POA: Insufficient documentation

## 2017-05-12 DIAGNOSIS — H6993 Unspecified Eustachian tube disorder, bilateral: Secondary | ICD-10-CM | POA: Insufficient documentation

## 2017-05-12 NOTE — Assessment & Plan Note (Signed)
Unclear etiology. Exam unremarkable except for pain in medial elbow with supination. Referral to Sports Medicine placed for further assessment. Appt has been scheduled.

## 2017-05-12 NOTE — Assessment & Plan Note (Signed)
BP improved on recheck. Asymptomatic. Patient has significant concerns that is Losartan is contributing to symptoms of ED. Will switch to a new ARB medication due to lower side effect profile. Will closely assess BP and ED symptoms. Follow-up scheduled.

## 2017-05-12 NOTE — Assessment & Plan Note (Signed)
Continue singulair. Restart antihistamine and Flonase.

## 2017-05-12 NOTE — Assessment & Plan Note (Signed)
Doing very well without breakthrough symptoms. Continue gluten-free diet.

## 2017-05-12 NOTE — Assessment & Plan Note (Signed)
Doing very well on Lexapro. Will continue current regimen.

## 2017-05-12 NOTE — Assessment & Plan Note (Signed)
Followed by Pulmonology and Oncology. Asymptomatic. Follow-up with specialists as scheduled.

## 2017-05-12 NOTE — Assessment & Plan Note (Signed)
Serous fluid noted. Eustachian tube dysfunction is a likely contributor but not likely sole cause. Referral to Audiology placed. Patient to start Flonase to help with fluid behind ears. OTC medications discussed.

## 2017-05-12 NOTE — Assessment & Plan Note (Signed)
Patient has had prior colonoscopy that was negative. Is due for screening colonoscopy at age 46.

## 2017-05-12 NOTE — Assessment & Plan Note (Signed)
Unclear etiology. 2 month history. Does not seem to be psychogenic. Patient with history of HTN but without history of HLD or DM. Is very active and eats a well-balanced diet overall. Is on Lexapro but has taken for many years without any side effect. Concerned that Losartan is contributing. Discussed that this is a potential side effect although thankfully not one we commonly encounter. Will attempt swith to Benicar from Losartan to see if there is any improvement. Exam unremarkable today. If no improvement, will refer to Urology.

## 2017-05-15 ENCOUNTER — Encounter: Payer: Self-pay | Admitting: Sports Medicine

## 2017-05-15 ENCOUNTER — Ambulatory Visit (INDEPENDENT_AMBULATORY_CARE_PROVIDER_SITE_OTHER): Payer: PRIVATE HEALTH INSURANCE | Admitting: Sports Medicine

## 2017-05-15 ENCOUNTER — Ambulatory Visit: Payer: Self-pay

## 2017-05-15 VITALS — BP 126/86 | HR 79 | Ht 72.0 in | Wt 228.8 lb

## 2017-05-15 DIAGNOSIS — M7521 Bicipital tendinitis, right shoulder: Secondary | ICD-10-CM | POA: Insufficient documentation

## 2017-05-15 DIAGNOSIS — D860 Sarcoidosis of lung: Secondary | ICD-10-CM

## 2017-05-15 DIAGNOSIS — M25521 Pain in right elbow: Secondary | ICD-10-CM | POA: Diagnosis not present

## 2017-05-15 DIAGNOSIS — G8929 Other chronic pain: Secondary | ICD-10-CM | POA: Diagnosis not present

## 2017-05-15 MED ORDER — NITROGLYCERIN 0.2 MG/HR TD PT24: MEDICATED_PATCH | TRANSDERMAL | 1 refills | Status: DC

## 2017-05-15 NOTE — Assessment & Plan Note (Signed)
Low suspicion of sarcoid component however if any lack of improvement or interval worsening further diagnostic evaluation with MRI will be indicated

## 2017-05-15 NOTE — Progress Notes (Signed)
OFFICE VISIT NOTE [facility]. [facility] at [facility] [phone]  BREWSTER WOLTERS - 46 y.o. male MRN [id number]  Date of birth: [birth year]  Visit Date: 05/15/2017  PCP: Waldon Merl, PA-C   Referred by: Waldon Merl, PA-C  Orlie Dakin, CMA acting as scribe for Dr. Berline Chough.  SUBJECTIVE:   Chief Complaint  Patient presents with  . New Patient (Initial Visit)    RT elbow pain   HPI: As below and per problem based documentation when appropriate.  Mr. Hyppolite is a new patient presenting today for evaluation of RT elbow pain. Pain is superior to the medial epicondyle.  Pain has been present for about 1 year.  No known injury of trauma. He played golf in the past and the swinging motion seems to be similar to what causes the pain. He is unable to finish a round without pain so he had to quit. Pt is very active with his job, climbing and carrying ladders.   The pain is described as pinching and burning and is rated as 8/10 after use.  Worsened with supination. Pain is aggravated by doing things like yard work.  Improves with rest. Pain tends to resolve after a day or so.  Therapies tried include : he has been evaluated at [facility] and had xray done, advised that there was nothing wrong, sent for dry needling. Pt reports some relief from pain after dry needling. He has tried using compression with some relief.   Other associated symptoms include: Pain radiates into the bicep and down the arm. When pain is bad he cannot even flex he bicep. He has decreased ROM. He has seen occupation therapist therapist at work and he was given home exercises.   Pt has hx of detached retina and was advised to limit steroids.     Review of Systems  Constitutional: Negative for chills and fever.  Respiratory: Negative for shortness of breath and wheezing.   Cardiovascular: Negative for chest pain and palpitations.    Gastrointestinal: Negative.   Genitourinary: Negative.   Musculoskeletal: Positive for joint pain and myalgias. Negative for falls.  Neurological: Negative for dizziness, tingling and headaches.  Endo/Heme/Allergies: Does not bruise/bleed easily.    Otherwise per HPI.  HISTORY & PERTINENT PRIOR DATA:  No specialty comments available. He reports that he quit smoking about 28 years ago. His smoking use included Cigarettes. He has a 0.25 pack-year smoking history. He quit smokeless tobacco use about 18 years ago. His smokeless tobacco use included Chew. No results for input(s): HGBA1C, LABURIC in the last 8760 hours. Medications & Allergies reviewed per EMR Patient Active Problem List   Diagnosis Date Noted  . Biceps tendinitis on right 05/15/2017  . Chronic elbow pain, right 05/12/2017  . Essential hypertension 05/12/2017  . Bilateral hearing loss 05/12/2017  . Eustachian tube dysfunction, bilateral 05/12/2017  . Erectile dysfunction 05/12/2017  . Celiac disease 05/12/2017  . Family history of colonic polyps 05/12/2017  . GAD (generalized anxiety disorder) 05/12/2017  . Pulmonary sarcoidosis (HCC) 12/16/2014  . Right inguinal hernia 04/20/2014   Past Medical History:  Diagnosis Date  . Anxiety   . Celiac disease   . Diverticulitis    per PET scan asymptomatic  . Hypertension    diet and exercise controlled.  Also has white coat syndrome.  Marland Kitchen Shortness of breath dyspnea    recently   Family History  Problem Relation Age of Onset  .  Aneurysm Mother        brain  . Drug abuse Mother   . Rheum arthritis Father   . Prostate cancer Father 47  . Stroke Father 78       preceded bymultiple TIAs  . Hypertension Father   . Hearing loss Father   . Dementia Father   . Colon polyps Father        late 80s.  . Cancer Maternal Aunt        x2  . Hearing loss Brother   . Hypertension Brother   . Diabetes Daughter    Past Surgical History:  Procedure Laterality Date  . COLONOSCOPY     . HERNIA REPAIR Left 2000ish  . HERNIA REPAIR Left 2015  . INGUINAL HERNIA REPAIR Right 04/30/2014   Procedure: LAPAROSCOPIC RIGHT  INGUINAL HERNIA REPAIR ;  Surgeon: Shelly Rubenstein, MD;  Location: [facility];  Service: General;  Laterality: Right;  . INSERTION OF MESH N/A 04/30/2014   Procedure: INSERTION OF MESH;  Surgeon: Shelly Rubenstein, MD;  Location: [facility];  Service: General;  Laterality: N/A;  . MEDIASTINOSCOPY N/A 12/29/2014   Procedure: MEDIASTINOSCOPY;  Surgeon: Kerin Perna, MD;  Location: [facility] OR;  Service: Thoracic;  Laterality: N/A;  . RETINOPATHY SURGERY     Serous  . trauma surgery      punctured lung lacerated spleen   . VASECTOMY    . VIDEO BRONCHOSCOPY WITH ENDOBRONCHIAL ULTRASOUND N/A 12/29/2014   Procedure: VIDEO BRONCHOSCOPY WITH ENDOBRONCHIAL ULTRASOUND;  Surgeon: Kerin Perna, MD;  Location: [facility] OR;  Service: Thoracic;  Laterality: N/A;   Social History   Occupational History  . commerical roofing    Social History Main Topics  . Smoking status: Former Smoker    Packs/day: 0.25    Years: 1.00    Types: Cigarettes    Quit date: 09/04/1988  . Smokeless tobacco: Former Neurosurgeon    Types: Chew    Quit date: 09/04/1998  . Alcohol use 1.2 oz/week    2 Glasses of wine per week     Comment: once a month  . Drug use: No  . Sexual activity: Yes    OBJECTIVE:  VS:  HT:6' (182.9 cm)   WT:228 lb 12.8 oz (103.8 kg)  BMI:31.02    BP:126/86  HR:79bpm  TEMP: ( )  RESP:97 % EXAM: Findings:  WDWN, NAD, Non-toxic appearing Alert & appropriately interactive Not depressed or anxious appearing No increased work of breathing. Pupils are equal. EOM intact without nystagmus No clubbing or cyanosis of the extremities appreciated No significant rashes/lesions/ulcerations overlying the examined area. Radial pulses 2+/4.  No significant generalized UE edema. Sensation intact to light touch in upper extremities.    Right elbow: Overall well aligned.  No significant  deformity.  Quite muscular.  No significant focal muscular pain over the common flexor or common extensor tendons or muscle bellies.  No problems or pain over the medial or lateral epicondyle.  He is ligamentously stable to varus and valgus stressing.  Does have pain with bicep hook test but this does appear and feel to be intact.  He has pain over the distal biceps as well with direct palpation.  Small amount of focal pain over the proximal radius and Cobra positioning.  Manual muscle testing of the upper extremity is 5+/5 but he does have pain with resisted biceps contraction as well as with resisted supination.     [place] Limited Joint Space Structures Up Right(no Linked Charges)  Result Date: 05/15/2017 Andrena MewsRigby, Jenner Rosier D, DO     05/15/2017  4:29 PM LIMITED MSK ULTRASOUND OF right elbow Images were obtained and interpreted by myself, Gaspar BiddingMichael Rontrell Moquin, DO Images have been saved and stored to PACS system. Images obtained on: GE S7 Ultrasound machine FINDINGS:  Lateral and medial epicondylar normal-appearing with no significant hypoechoic change.  There is a small amount of hypervascular change over the common flexor tendon but this is right in the area of a superficial vessel and I suspect this is more reactive to the biceps issue.  Distal biceps tendon does appear to be intact but is incompletely visualized as it dives deep to the neurovascular structures.  Cobra positioning does reveal increased neovascularity around the region of the insertion of the biceps tendon however the tendon does appear to be intact at this level as well. IMPRESSION: 1. 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  If further confirmatory information is needed MRI recommended.   ASSESSMENT & PLAN:     ICD-10-CM   1. Chronic elbow pain, right M25.521 [place] LIMITED JOINT SPACE STRUCTURES UP RIGHT(NO LINKED CHARGES)   G89.29   2. Pulmonary sarcoidosis (HCC) D86.0   3. Biceps tendinitis on right M75.21     ================================================================= Pulmonary sarcoidosis (HCC) Low suspicion of sarcoid component however if any lack of improvement or interval worsening further diagnostic evaluation with MRI will be indicated  Biceps tendinitis on right Symptoms are consistent with a distal biceps strain no evidence of overt tearing on the MSK ultrasound however this is incompletely visualized tendon on ultrasound and definitive testing would be with MRI.  This is a chronic issue for him however and we can continue with conservative management with the addition of nitroglycerin protocol and eccentric exercises to focus on the distal biceps.  We will plan to have him follow-up in 6 weeks for repeat MSK ultrasound and repeat clinical evaluation if any lack of improvement in the interim we will consider MRI.  Also discussed use of compression on a regular basis and avoidance of exacerbating activities. =================================================================  Follow-up: Return in about 6 weeks (around 06/26/2017).   CMA/ATC served as Neurosurgeonscribe during this visit. History, Physical, and Plan performed by medical provider. Documentation and orders reviewed and attested to.      Gaspar BiddingMichael Traniya Prichett, DO    [facility] Phys[facility]

## 2017-05-15 NOTE — Patient Instructions (Addendum)

## 2017-05-15 NOTE — Assessment & Plan Note (Signed)
Symptoms are consistent with a distal biceps strain no evidence of overt tearing on the MSK ultrasound however this is incompletely visualized tendon on ultrasound and definitive testing would be with MRI.  This is a chronic issue for him however and we can continue with conservative management with the addition of nitroglycerin protocol and eccentric exercises to focus on the distal biceps.  We will plan to have him follow-up in 6 weeks for repeat MSK ultrasound and repeat clinical evaluation if any lack of improvement in the interim we will consider MRI.  Also discussed use of compression on a regular basis and avoidance of exacerbating activities.

## 2017-05-15 NOTE — Procedures (Signed)
LIMITED MSK ULTRASOUND OF right elbow Images were obtained and interpreted by myself, Gaspar BiddingMichael Larry Alcock, DO  Images have been saved and stored to PACS system. Images obtained on: GE S7 Ultrasound machine  FINDINGS:   Lateral and medial epicondylar normal-appearing with no significant hypoechoic change.  There is a small amount of hypervascular change over the common flexor tendon but this is right in the area of a superficial vessel and I suspect this is more reactive to the biceps issue.  Distal biceps tendon does appear to be intact but is incompletely visualized as it dives deep to the neurovascular structures.  Cobra positioning does reveal increased neovascularity around the region of the insertion of the biceps tendon however the tendon does appear to be intact at this level as well.  IMPRESSION:  1. 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  If further confirmatory information is needed MRI recommended.

## 2017-05-23 ENCOUNTER — Telehealth: Payer: Self-pay | Admitting: Physician Assistant

## 2017-05-23 NOTE — Telephone Encounter (Addendum)
FYI: Spoke with patient spouse. She was concerned that patient is still feeling up tight, stressed and increased pressure at work.  He started the Lexapro which is helping some.  He has appointment on Friday for follow. Will address at appointment

## 2017-05-23 NOTE — Telephone Encounter (Signed)
Wife would like a call back regarding some concerns she has noticed with pt before his appt on Friday. She would not go in detail with me, however a call back from the nurse would be fine

## 2017-05-23 NOTE — Telephone Encounter (Signed)
Noted  

## 2017-05-25 ENCOUNTER — Other Ambulatory Visit: Payer: Self-pay | Admitting: *Deleted

## 2017-05-25 ENCOUNTER — Ambulatory Visit (INDEPENDENT_AMBULATORY_CARE_PROVIDER_SITE_OTHER): Payer: PRIVATE HEALTH INSURANCE | Admitting: Physician Assistant

## 2017-05-25 ENCOUNTER — Encounter: Payer: Self-pay | Admitting: Physician Assistant

## 2017-05-25 VITALS — BP 124/82 | HR 74 | Temp 97.9°F | Resp 14 | Ht 72.0 in | Wt 227.0 lb

## 2017-05-25 DIAGNOSIS — N529 Male erectile dysfunction, unspecified: Secondary | ICD-10-CM | POA: Diagnosis not present

## 2017-05-25 DIAGNOSIS — I10 Essential (primary) hypertension: Secondary | ICD-10-CM

## 2017-05-25 DIAGNOSIS — Z23 Encounter for immunization: Secondary | ICD-10-CM

## 2017-05-25 DIAGNOSIS — R7989 Other specified abnormal findings of blood chemistry: Secondary | ICD-10-CM

## 2017-05-25 LAB — TESTOSTERONE: TESTOSTERONE: 301.81 ng/dL (ref 300.00–890.00)

## 2017-05-25 NOTE — Progress Notes (Signed)
Patient presents to clinic today for follow-up after change of medications. At last visit we changed losartan to olmesartan to help mitigate potential side effects -- fatigue and ED. Patient endorses taking new medication as directed. Patient denies chest pain, palpitations, lightheadedness, dizziness, vision changes or frequent headaches. Notes improvement in energy. Some mild improvement in ED. Is currently on a nitroglycerin patch for biceps tendinitis. Denies new concerns today.  BP Readings from Last 3 Encounters:  05/25/17 124/82  05/15/17 126/86  05/11/17 122/84   Past Medical History:  Diagnosis Date  . Anxiety   . Celiac disease   . Diverticulitis    per PET scan asymptomatic  . Hypertension    diet and exercise controlled.  Also has white coat syndrome.  Marland Kitchen Shortness of breath dyspnea    recently    Current Outpatient Prescriptions on File Prior to Visit  Medication Sig Dispense Refill  . escitalopram (LEXAPRO) 10 MG tablet Take 10 mg by mouth daily.    . montelukast (SINGULAIR) 10 MG tablet Take 10 mg by mouth at bedtime.    . nitroGLYCERIN (NITRODUR - DOSED IN MG/24 HR) 0.2 mg/hr patch Place 1/4 to 1/2 of a patch over affected region. Remove and replace once daily.  Slightly alter skin placement daily 30 patch 1  . olmesartan (BENICAR) 20 MG tablet Take 1 tablet (20 mg total) by mouth daily. 30 tablet 1  . vitamin B-12 (CYANOCOBALAMIN) 1000 MCG tablet Take 1,000 mcg by mouth daily.    . Vitamin D, Ergocalciferol, 2000 units CAPS Take 1 capsule by mouth daily.     No current facility-administered medications on file prior to visit.     Allergies  Allergen Reactions  . Gluten Meal Other (See Comments)    Celiac disease    Family History  Problem Relation Age of Onset  . Aneurysm Mother        brain  . Drug abuse Mother   . Rheum arthritis Father   . Prostate cancer Father 52  . Stroke Father 45       preceded bymultiple TIAs  . Hypertension Father   .  Hearing loss Father   . Dementia Father   . Colon polyps Father        late 18s.  . Cancer Maternal Aunt        x2  . Hearing loss Brother   . Hypertension Brother   . Diabetes Daughter     Social History   Social History  . Marital status: Married    Spouse name: N/A  . Number of children: 3  . Years of education: N/A   Occupational History  . commerical roofing    Social History Main Topics  . Smoking status: Former Smoker    Packs/day: 0.25    Years: 1.00    Types: Cigarettes    Quit date: 09/04/1988  . Smokeless tobacco: Former Neurosurgeon    Types: Chew    Quit date: 09/04/1998  . Alcohol use 1.2 oz/week    2 Glasses of wine per week     Comment: once a month  . Drug use: No  . Sexual activity: Yes   Other Topics Concern  . None   Social History Narrative  . None   Review of Systems - See HPI.  All other ROS are negative.  BP 124/82   Pulse 74   Temp 97.9 F (36.6 C) (Oral)   Resp 14   Ht 6' (1.829 m)  Wt 227 lb (103 kg)   SpO2 99%   BMI 30.79 kg/m   Physical Exam  Constitutional: He is well-developed, well-nourished, and in no distress.  HENT:  Head: Normocephalic and atraumatic.  Eyes: Conjunctivae are normal.  Neck: Neck supple.  Cardiovascular: Normal rate, regular rhythm, normal heart sounds and intact distal pulses.   Pulmonary/Chest: Effort normal and breath sounds normal. No respiratory distress. He has no wheezes. He has no rales. He exhibits no tenderness.  Lymphadenopathy:    He has no cervical adenopathy.  Skin: Skin is warm and dry. No rash noted.  Psychiatric: Affect normal.  Vitals reviewed.  Assessment/Plan: Essential hypertension BP looks great today. Fatigue improved with change from losartan. Asymptomatic. Continue current regimen. Follow-up 3 months.  Erectile dysfunction Slightly improved with change in ARB. Is on nitro patch from sports medicine. Viagra not an option currently. Will check testosterone level today. If normal  will refer to Urology.    Piedad Climes, PA-C

## 2017-05-25 NOTE — Assessment & Plan Note (Signed)
BP looks great today. Fatigue improved with change from losartan. Asymptomatic. Continue current regimen. Follow-up 3 months.

## 2017-05-25 NOTE — Progress Notes (Signed)
Pre visit review using our clinic review tool, if applicable. No additional management support is needed unless otherwise documented below in the visit note. 

## 2017-05-25 NOTE — Assessment & Plan Note (Signed)
Slightly improved with change in ARB. Is on nitro patch from sports medicine. Viagra not an option currently. Will check testosterone level today. If normal will refer to Urology.

## 2017-05-25 NOTE — Patient Instructions (Signed)
Please go to the lab for blood work. I will call you with your results. Please continue medications as directed.  Your BP looks great.  Keep up the good work with diet and exercise.

## 2017-05-30 ENCOUNTER — Encounter: Payer: Self-pay | Admitting: Physician Assistant

## 2017-06-04 DIAGNOSIS — Z8249 Family history of ischemic heart disease and other diseases of the circulatory system: Secondary | ICD-10-CM | POA: Diagnosis not present

## 2017-06-04 DIAGNOSIS — N5201 Erectile dysfunction due to arterial insufficiency: Secondary | ICD-10-CM | POA: Diagnosis not present

## 2017-06-19 ENCOUNTER — Encounter: Payer: Self-pay | Admitting: Physician Assistant

## 2017-06-19 DIAGNOSIS — Z8249 Family history of ischemic heart disease and other diseases of the circulatory system: Secondary | ICD-10-CM

## 2017-06-19 DIAGNOSIS — N5201 Erectile dysfunction due to arterial insufficiency: Secondary | ICD-10-CM

## 2017-06-20 NOTE — Addendum Note (Signed)
Addended by: Waldon MerlMARTIN, Louann Hopson C on: 06/20/2017 09:05 AM   Modules accepted: Orders

## 2017-06-26 ENCOUNTER — Ambulatory Visit: Payer: PRIVATE HEALTH INSURANCE | Admitting: Sports Medicine

## 2017-06-26 DIAGNOSIS — Z0289 Encounter for other administrative examinations: Secondary | ICD-10-CM

## 2017-06-28 ENCOUNTER — Inpatient Hospital Stay (HOSPITAL_COMMUNITY): Admission: RE | Admit: 2017-06-28 | Payer: PRIVATE HEALTH INSURANCE | Source: Ambulatory Visit

## 2017-07-03 ENCOUNTER — Other Ambulatory Visit: Payer: Self-pay | Admitting: Emergency Medicine

## 2017-07-03 ENCOUNTER — Telehealth (HOSPITAL_COMMUNITY): Payer: Self-pay

## 2017-07-03 MED ORDER — OLMESARTAN MEDOXOMIL 20 MG PO TABS: 20.0000 mg | ORAL_TABLET | Freq: Every day | ORAL | 1 refills | Status: DC

## 2017-07-03 NOTE — Telephone Encounter (Signed)
Encounter complete. 

## 2017-07-05 ENCOUNTER — Ambulatory Visit (HOSPITAL_COMMUNITY)
Admission: RE | Admit: 2017-07-05 | Discharge: 2017-07-05 | Disposition: A | Discharge status: Home / Self Care | Payer: PRIVATE HEALTH INSURANCE | Source: Ambulatory Visit | Attending: Internal Medicine | Admitting: Internal Medicine

## 2017-07-05 DIAGNOSIS — Z8249 Family history of ischemic heart disease and other diseases of the circulatory system: Secondary | ICD-10-CM

## 2017-07-05 DIAGNOSIS — N5201 Erectile dysfunction due to arterial insufficiency: Secondary | ICD-10-CM | POA: Insufficient documentation

## 2017-07-05 LAB — EXERCISE TOLERANCE TEST
CSEPED: 10 min
CSEPHR: 93 %
Estimated workload: 12.1 METS
Exercise duration (sec): 14 s
MPHR: 172 {beats}/min
Peak HR: 160 {beats}/min
RPE: 18
Rest HR: 66 {beats}/min

## 2017-07-10 ENCOUNTER — Telehealth: Payer: Self-pay | Admitting: Physician Assistant

## 2017-07-10 NOTE — Telephone Encounter (Signed)
Wife asking for a call back to discuss pt labs, states that pt needs a better understanding on them

## 2017-07-11 NOTE — Telephone Encounter (Signed)
Spoke with patient wife. She wanted us to give patient reassurance from his Stress test. Advised patient was notified of normal stress test. She states patient is really anxious about his health, thinking about his health. She will discuss with him.

## 2017-07-11 NOTE — Telephone Encounter (Signed)
Patient's wife calling back to state she never received a call back yesterday.  She is requesting call back asap, Tel # [phone]

## 2017-07-13 ENCOUNTER — Telehealth: Payer: Self-pay | Admitting: Emergency Medicine

## 2017-07-13 NOTE — Telephone Encounter (Signed)
Patient spouse Annabelle HarmanDana called yesterday about her husband.

## 2017-07-13 NOTE — Telephone Encounter (Signed)
LMOVM advising patient to call the office to schedule an appointment to discuss and [facility].   Spoke with [name] about health concerns. She has noticed he has been more anxious about his health. He has stopped his Lexapro. He has concerns about his health. She wanted us to give patient reassurance about his stress test and which medications he can take.

## 2017-07-16 ENCOUNTER — Telehealth: Payer: Self-pay | Admitting: Physician Assistant

## 2017-07-16 NOTE — Telephone Encounter (Signed)
Wife asking that someone give a call back to pt regarding that result of his stress test, she states that pt is not understanding it on MyChart.

## 2017-07-19 NOTE — Telephone Encounter (Signed)
LMOVM advising patient to call back with questions and concerns about his stress test.

## 2017-08-03 NOTE — Telephone Encounter (Signed)
LMOVM checking with patient for any questions or concerns of his health, mindset or medication review. Prefer patient to schedule an appointment to discuss. OK for PEC to ask questions and concerns.

## 2017-10-23 ENCOUNTER — Other Ambulatory Visit: Payer: Self-pay

## 2017-10-23 ENCOUNTER — Encounter: Payer: Self-pay | Admitting: Physician Assistant

## 2017-10-23 ENCOUNTER — Ambulatory Visit (INDEPENDENT_AMBULATORY_CARE_PROVIDER_SITE_OTHER): Payer: PRIVATE HEALTH INSURANCE | Admitting: Physician Assistant

## 2017-10-23 VITALS — BP 122/84 | HR 64 | Temp 97.9°F | Resp 16 | Ht 72.0 in | Wt 234.0 lb

## 2017-10-23 DIAGNOSIS — E349 Endocrine disorder, unspecified: Secondary | ICD-10-CM

## 2017-10-23 DIAGNOSIS — F411 Generalized anxiety disorder: Secondary | ICD-10-CM

## 2017-10-23 DIAGNOSIS — I10 Essential (primary) hypertension: Secondary | ICD-10-CM

## 2017-10-23 DIAGNOSIS — Z0000 Encounter for general adult medical examination without abnormal findings: Secondary | ICD-10-CM

## 2017-10-23 DIAGNOSIS — F329 Major depressive disorder, single episode, unspecified: Secondary | ICD-10-CM | POA: Diagnosis not present

## 2017-10-23 DIAGNOSIS — H6983 Other specified disorders of Eustachian tube, bilateral: Secondary | ICD-10-CM | POA: Diagnosis not present

## 2017-10-23 DIAGNOSIS — F32A Depression, unspecified: Secondary | ICD-10-CM

## 2017-10-23 DIAGNOSIS — D860 Sarcoidosis of lung: Secondary | ICD-10-CM | POA: Diagnosis not present

## 2017-10-23 DIAGNOSIS — F419 Anxiety disorder, unspecified: Secondary | ICD-10-CM | POA: Diagnosis not present

## 2017-10-23 DIAGNOSIS — H6993 Unspecified Eustachian tube disorder, bilateral: Secondary | ICD-10-CM

## 2017-10-23 DIAGNOSIS — Z23 Encounter for immunization: Secondary | ICD-10-CM

## 2017-10-23 LAB — COMPREHENSIVE METABOLIC PANEL
ALT: 65 U/L — ABNORMAL HIGH (ref 0–53)
AST: 33 U/L (ref 0–37)
Albumin: 4.6 g/dL (ref 3.5–5.2)
Alkaline Phosphatase: 67 U/L (ref 39–117)
BUN: 11 mg/dL (ref 6–23)
CHLORIDE: 103 meq/L (ref 96–112)
CO2: 31 meq/L (ref 19–32)
Calcium: 9.9 mg/dL (ref 8.4–10.5)
Creatinine, Ser: 0.83 mg/dL (ref 0.40–1.50)
GFR: 104.63 mL/min (ref >60.00)
Glucose, Bld: 109 mg/dL — ABNORMAL HIGH (ref 70–99)
POTASSIUM: 4.2 meq/L (ref 3.5–5.1)
Sodium: 139 mEq/L (ref 135–145)
Total Bilirubin: 1 mg/dL (ref 0.2–1.2)
Total Protein: 7.3 g/dL (ref 6.0–8.3)

## 2017-10-23 LAB — CBC WITH DIFFERENTIAL/PLATELET
Basophils Absolute: 0 10*3/uL (ref 0.0–0.1)
Basophils Relative: 0.5 % (ref 0.0–3.0)
Eosinophils Absolute: 0.3 10*3/uL (ref 0.0–0.7)
Eosinophils Relative: 3.9 % (ref 0.0–5.0)
HCT: 45.7 % (ref 39.0–52.0)
Hemoglobin: 16 g/dL (ref 13.0–17.0)
LYMPHS ABS: 2 10*3/uL (ref 0.7–4.0)
Lymphocytes Relative: 29.2 % (ref 12.0–46.0)
MCHC: 34.9 g/dL (ref 30.0–36.0)
MCV: 93.8 fl (ref 78.0–100.0)
MONO ABS: 0.5 10*3/uL (ref 0.1–1.0)
Monocytes Relative: 7.2 % (ref 3.0–12.0)
NEUTROS PCT: 59.2 % (ref 43.0–77.0)
Neutro Abs: 4 10*3/uL (ref 1.4–7.7)
PLATELETS: 246 10*3/uL (ref 150.0–400.0)
RBC: 4.87 Mil/uL (ref 4.22–5.81)
RDW: 12.6 % (ref 11.5–15.5)
WBC: 6.7 10*3/uL (ref 4.0–10.5)

## 2017-10-23 LAB — TSH: TSH: 3.98 u[IU]/mL (ref 0.35–4.50)

## 2017-10-23 LAB — LIPID PANEL
CHOLESTEROL: 189 mg/dL (ref 0–200)
HDL: 29.1 mg/dL — ABNORMAL LOW (ref >39.00)
LDL CALC: 128 mg/dL — AB (ref 0–99)
NonHDL: 159.61
Total CHOL/HDL Ratio: 6
Triglycerides: 157 mg/dL — ABNORMAL HIGH (ref 0.0–149.0)
VLDL: 31.4 mg/dL (ref 0.0–40.0)

## 2017-10-23 LAB — VITAMIN D 25 HYDROXY (VIT D DEFICIENCY, FRACTURES): VITD: 29.33 ng/mL — ABNORMAL LOW (ref 30.00–100.00)

## 2017-10-23 LAB — HEMOGLOBIN A1C: HEMOGLOBIN A1C: 5 % (ref 4.6–6.5)

## 2017-10-23 LAB — TESTOSTERONE: Testosterone: 408.43 ng/dL (ref 300.00–890.00)

## 2017-10-23 LAB — VITAMIN B12: VITAMIN B 12: 501 pg/mL (ref 211–911)

## 2017-10-23 LAB — PSA: PSA: 1.11 ng/mL (ref 0.10–4.00)

## 2017-10-23 MED ORDER — VORTIOXETINE HBR 10 MG PO TABS: 10.0000 mg | ORAL_TABLET | Freq: Every day | ORAL | 3 refills | Status: DC

## 2017-10-23 NOTE — Assessment & Plan Note (Signed)
TDaP updated

## 2017-10-23 NOTE — Assessment & Plan Note (Signed)
Normotensive. Asymptomatic. Continue current regimen. Repeat CMP today.

## 2017-10-23 NOTE — Assessment & Plan Note (Signed)
Recommend daily antihistamine and Flonase in addition to Singulair.

## 2017-10-23 NOTE — Patient Instructions (Signed)
Please go to the lab for blood work.   Our office will call you with your results unless you have chosen to receive results via MyChart.  If your blood work is normal we will follow-up each year for physicals and as scheduled for chronic medical problems.  If anything is abnormal we will treat accordingly and get you in for a follow-up.  Start the Trintellix as directed. Follow-up with me in 4 weeks for reassessment.   Avoid prolonged positioning of R hand on steering wheel as it is causing irritation in your nerves as they pass through the elbow. Start a daily turmeric 500 mg daily. Let me know if things are not improving.    Preventive Care 40-64 Years, Male Preventive care refers to lifestyle choices and visits with your health care provider that can promote health and wellness. What does preventive care include?  A yearly physical exam. This is also called an annual well check.  Dental exams once or twice a year.  Routine eye exams. Ask your health care provider how often you should have your eyes checked.  Personal lifestyle choices, including: ? Daily care of your teeth and gums. ? Regular physical activity. ? Eating a healthy diet. ? Avoiding tobacco and drug use. ? Limiting alcohol use. ? Practicing safe sex. ? Taking low-dose aspirin every day starting at age 39. What happens during an annual well check? The services and screenings done by your health care provider during your annual well check will depend on your age, overall health, lifestyle risk factors, and family history of disease. Counseling Your health care provider may ask you questions about your:  Alcohol use.  Tobacco use.  Drug use.  Emotional well-being.  Home and relationship well-being.  Sexual activity.  Eating habits.  Work and work Statistician.  Screening You may have the following tests or measurements:  Height, weight, and BMI.  Blood pressure.  Lipid and cholesterol levels.  These may be checked every 5 years, or more frequently if you are over 41 years old.  Skin check.  Lung cancer screening. You may have this screening every year starting at age 72 if you have a 30-pack-year history of smoking and currently smoke or have quit within the past 15 years.  Fecal occult blood test (FOBT) of the stool. You may have this test every year starting at age 87.  Flexible sigmoidoscopy or colonoscopy. You may have a sigmoidoscopy every 5 years or a colonoscopy every 10 years starting at age 65.  Prostate cancer screening. Recommendations will vary depending on your family history and other risks.  Hepatitis C blood test.  Hepatitis B blood test.  Sexually transmitted disease (STD) testing.  Diabetes screening. This is done by checking your blood sugar (glucose) after you have not eaten for a while (fasting). You may have this done every 1-3 years.  Discuss your test results, treatment options, and if necessary, the need for more tests with your health care provider. Vaccines Your health care provider may recommend certain vaccines, such as:  Influenza vaccine. This is recommended every year.  Tetanus, diphtheria, and acellular pertussis (Tdap, Td) vaccine. You may need a Td booster every 10 years.  Varicella vaccine. You may need this if you have not been vaccinated.  Zoster vaccine. You may need this after age 47.  Measles, mumps, and rubella (MMR) vaccine. You may need at least one dose of MMR if you were born in 1957 or later. You may also need a  second dose.  Pneumococcal 13-valent conjugate (PCV13) vaccine. You may need this if you have certain conditions and have not been vaccinated.  Pneumococcal polysaccharide (PPSV23) vaccine. You may need one or two doses if you smoke cigarettes or if you have certain conditions.  Meningococcal vaccine. You may need this if you have certain conditions.  Hepatitis A vaccine. You may need this if you have certain  conditions or if you travel or work in places where you may be exposed to hepatitis A.  Hepatitis B vaccine. You may need this if you have certain conditions or if you travel or work in places where you may be exposed to hepatitis B.  Haemophilus influenzae type b (Hib) vaccine. You may need this if you have certain risk factors.  Talk to your health care provider about which screenings and vaccines you need and how often you need them. This information is not intended to replace advice given to you by your health care provider. Make sure you discuss any questions you have with your health care provider. Document Released: 09/17/2015 Document Revised: 05/10/2016 Document Reviewed: 06/22/2015 Elsevier Interactive Patient Education  [employer].  .

## 2017-10-23 NOTE — Assessment & Plan Note (Signed)
Repeat AM testosterone level this morning. Will also check PSA.

## 2017-10-23 NOTE — Assessment & Plan Note (Signed)
Will start Trintellix at 10 mg. Voucher given. Follow-up 4 weeks.

## 2017-10-23 NOTE — Progress Notes (Addendum)
Patient presents to clinic today for annual exam.  Patient is fasting for labs.  Acute Concerns: Patient with history of borderline testosterone levels. Would like rechecked today. Notes fatigue, decreased endurance with aerobic exercise and difficulty with weight loss.   Chronic Issues: Hypertension -- Patient is currently on a regimen of Benicar daily. Is taking as directed. Patient denies chest pain, palpitations, lightheadedness, dizziness, vision changes or frequent headaches. Is working hard on diet and exercise and has noted weight loss.   BP Readings from Last 3 Encounters:  10/23/17 122/84  05/25/17 124/82  05/15/17 126/86   Anxiety -- Previously on Lexapro/Escitalopram 10 mg. Was stopped 2/2 erectile dysfunction. Was also on Effexor in the past without improvement of symptoms. Notes depressed mood with anhedonia and occasional anxiety/irritability. Denies SI/HI. Would like to discuss other options.   Health Maintenance: Immunizations -- Flu shot up-to-date. Tetanus due. Agrees to get today.  Past Medical History:  Diagnosis Date  . Anxiety   . Celiac disease   . Diverticulitis    per PET scan asymptomatic  . Hypertension    diet and exercise controlled.  Also has white coat syndrome.  Marland Kitchen. Shortness of breath dyspnea    recently    Past Surgical History:  Procedure Laterality Date  . COLONOSCOPY    . HERNIA REPAIR Left 2000ish  . HERNIA REPAIR Left 2015  . INGUINAL HERNIA REPAIR Right 04/30/2014   Procedure: LAPAROSCOPIC RIGHT  INGUINAL HERNIA REPAIR ;  Surgeon: Shelly Rubensteinouglas A Blackman, MD;  Location: [facility];  Service: General;  Laterality: Right;  . INSERTION OF MESH N/A 04/30/2014   Procedure: INSERTION OF MESH;  Surgeon: Shelly Rubensteinouglas A Blackman, MD;  Location: [facility];  Service: General;  Laterality: N/A;  . MEDIASTINOSCOPY N/A 12/29/2014   Procedure: MEDIASTINOSCOPY;  Surgeon: Kerin PernaPeter Van Trigt, MD;  [facility];  Service: Thoracic;  Laterality: N/A;  . RETINOPATHY  SURGERY     Serous  . trauma surgery      punctured lung lacerated spleen   . VASECTOMY    . VIDEO BRONCHOSCOPY WITH ENDOBRONCHIAL ULTRASOUND N/A 12/29/2014   Procedure: VIDEO BRONCHOSCOPY WITH ENDOBRONCHIAL ULTRASOUND;  Surgeon: Kerin PernaPeter Van Trigt, MD;  Location: [facility] OR;  Service: Thoracic;  Laterality: N/A;    Current Outpatient Medications on File Prior to Visit  Medication Sig Dispense Refill  . montelukast (SINGULAIR) 10 MG tablet Take 10 mg by mouth at bedtime.    Marland Kitchen. olmesartan (BENICAR) 20 MG tablet Take 1 tablet (20 mg total) by mouth daily. 90 tablet 1  . vitamin B-12 (CYANOCOBALAMIN) 1000 MCG tablet Take 1,000 mcg by mouth daily.    . Vitamin D, Ergocalciferol, 2000 units CAPS Take 1 capsule by mouth daily.     No current facility-administered medications on file prior to visit.     Allergies  Allergen Reactions  . Gluten Meal Other (See Comments)    Celiac disease    Family History  Problem Relation Age of Onset  . Aneurysm Mother        brain  . Drug abuse Mother   . Rheum arthritis Father   . Prostate cancer Father [id number]  . Stroke Father [id number]       preceded bymultiple TIAs  . Hypertension Father   . Hearing loss Father   . Dementia Father   . Colon polyps Father        late 7360s.  . Cancer Maternal Aunt        x2  .  Hearing loss Brother   . Hypertension Brother   . Diabetes Daughter     Social History   Socioeconomic History  . Marital status: Married    Spouse name: Not on file  . Number of children: 3  . Years of education: Not on file  . Highest education level: Not on file  Social Needs  . Financial resource strain: Not on file  . Food insecurity - worry: Not on file  . Food insecurity - inability: Not on file  . Transportation needs - medical: Not on file  . Transportation needs - non-medical: Not on file  Occupational History  . Occupation: Government social research officer  Tobacco Use  . Smoking status: Former Smoker    Packs/day: 0.25    Years: 1.00     Pack years: 0.25    Types: Cigarettes    Last attempt to quit: 09/04/1988    Years since quitting: 29.1  . Smokeless tobacco: Former Neurosurgeon    Types: Chew    Quit date: 09/04/1998  Substance and Sexual Activity  . Alcohol use: Yes    Alcohol/week: 1.2 oz    Types: 2 Glasses of wine per week    Comment: once a month  . Drug use: No  . Sexual activity: Yes  Other Topics Concern  . Not on file  Social History Narrative   Very active job   Review of Systems  Constitutional: Negative for fever and weight loss.  HENT: Negative for ear discharge, ear pain, hearing loss and tinnitus.   Eyes: Negative for blurred vision, double vision, photophobia and pain.  Respiratory: Negative for cough and shortness of breath.   Cardiovascular: Negative for chest pain and palpitations.  Gastrointestinal: Negative for abdominal pain, blood in stool, constipation, diarrhea, heartburn, melena, nausea and vomiting.  Genitourinary: Negative for dysuria, flank pain, frequency, hematuria and urgency.       Nocturia x 1.  Musculoskeletal: Negative for falls.  Neurological: Positive for sensory change (occasional tingling in his hands. ). Negative for dizziness, loss of consciousness and headaches.  Endo/Heme/Allergies: Negative for environmental allergies.  Psychiatric/Behavioral: Positive for depression. Negative for hallucinations, substance abuse and suicidal ideas. The patient is nervous/anxious. The patient does not have insomnia.    BP 122/84   Pulse 64   Temp 97.9 F (36.6 C) (Oral)   Resp 16   Ht 6' (1.829 m)   Wt 234 lb (106.1 kg)   SpO2 98%   BMI 31.74 kg/m   Physical Exam  Constitutional: He is oriented to person, place, and time and well-developed, well-nourished, and in no distress.  HENT:  Head: Normocephalic and atraumatic.  Right Ear: External ear normal. A middle ear effusion (serous) is present.  Left Ear: External ear normal.  Nose: Nose normal.  Mouth/Throat: Uvula is midline,  oropharynx is clear and moist and mucous membranes are normal. No oropharyngeal exudate.  Eyes: Conjunctivae are normal. Pupils are equal, round, and reactive to light.  Neck: Neck supple.  Cardiovascular: Normal rate, regular rhythm, normal heart sounds and intact distal pulses.  Pulmonary/Chest: Effort normal and breath sounds normal. No respiratory distress. He has no wheezes. He has no rales. He exhibits no tenderness.  Lymphadenopathy:    He has no cervical adenopathy.  Neurological: He is alert and oriented to person, place, and time.  Skin: Skin is warm and dry. No rash noted.  Psychiatric: Affect normal.  Vitals reviewed.  Assessment/Plan: Essential hypertension Normotensive. Asymptomatic. Continue current regimen. Repeat CMP today.  Pulmonary sarcoidosis (HCC) No follow-up with Pulmonology in about 2 years. Asymptomatic presently. Referral to Pulmonology placed for reassessment.   Eustachian tube dysfunction, bilateral Recommend daily antihistamine and Flonase in addition to Singulair.   Testosterone insufficiency Repeat AM testosterone level this morning. Will also check PSA.  Need for Tdap vaccination TDaP updated.  GAD (generalized anxiety disorder) Will start Trintellix at 10 mg. Voucher given. Follow-up 4 weeks.    Piedad Climes, PA-C

## 2017-10-23 NOTE — Assessment & Plan Note (Signed)
No follow-up with Pulmonology in about 2 years. Asymptomatic presently. Referral to Pulmonology placed for reassessment.

## 2017-10-25 ENCOUNTER — Other Ambulatory Visit: Payer: Self-pay | Admitting: Physician Assistant

## 2017-10-25 DIAGNOSIS — R945 Abnormal results of liver function studies: Secondary | ICD-10-CM

## 2017-10-25 DIAGNOSIS — R7989 Other specified abnormal findings of blood chemistry: Secondary | ICD-10-CM

## 2017-10-25 DIAGNOSIS — E559 Vitamin D deficiency, unspecified: Secondary | ICD-10-CM

## 2017-10-25 MED ORDER — VITAMIN D (ERGOCALCIFEROL) 50 MCG (2000 UT) PO CAPS: 2.0000 | ORAL_CAPSULE | Freq: Every day | ORAL | 0 refills | Status: DC

## 2017-10-29 ENCOUNTER — Telehealth: Payer: Self-pay | Admitting: Physician Assistant

## 2017-10-29 DIAGNOSIS — F32A Depression, unspecified: Secondary | ICD-10-CM

## 2017-10-29 DIAGNOSIS — F419 Anxiety disorder, unspecified: Principal | ICD-10-CM

## 2017-10-29 DIAGNOSIS — F329 Major depressive disorder, single episode, unspecified: Secondary | ICD-10-CM

## 2017-10-29 NOTE — Telephone Encounter (Signed)
Copied from CRM [phone]#[id number]. Topic: Quick Communication - See Telephone Encounter >> Oct 29, 2017  8:03 AM Guinevere FerrariMorris, Juda Lajeunesse E, NT wrote: CRM for notification. See Telephone encounter for: Patient wife called and said they were prescribed vortioxetine HBr (TRINTELLIX) 10 MG TABS but is too expensive and wanted to see if there was another medication that was less expensive that could called in. Pt uses [id number] - [place], [place] - [address] [phone] (Phone) [phone] (Fax)    10/29/17.

## 2017-10-29 NOTE — Telephone Encounter (Signed)
Pt. seen in office 10/23/17; prescribed Trintellix for anxiety; is asking for alternative to this, because it is too expensive.  [facility]

## 2017-10-29 NOTE — Telephone Encounter (Signed)
Prior authorization started on the Trintellix on 10/23/17. Approved thru insurance. Coverage started 10/23/17-10/23/97. Contact patient pharmacy about price of medication. Cost of Trintellix with approval of prior authorization is $286.86 for #30 pills and discount card. Please advice

## 2017-10-31 MED ORDER — CITALOPRAM HYDROBROMIDE 10 MG PO TABS: 10.0000 mg | ORAL_TABLET | Freq: Every day | ORAL | 1 refills | Status: DC

## 2017-10-31 NOTE — Telephone Encounter (Signed)
I agree that this is too expensive but I will defer to PCP as I am not sure what pt has tried (and/or failed) in the past.

## 2017-10-31 NOTE — Telephone Encounter (Signed)
Spoke with patient spouse Annabelle HarmanDana about medication. Apologized for the cost of medication. She is agreeable with rx for Citalopram 10 mg daily. [facility] She will notify us if problems or cost of medication.

## 2017-10-31 NOTE — Addendum Note (Signed)
Addended by: Con MemosMOORE, Maleka Contino S on: 10/31/2017 11:56 AM   Modules accepted: Orders

## 2017-10-31 NOTE — Telephone Encounter (Signed)
I am not sure why it is so expensive with insurance coverage and the voucher. My apologies. I would recommend we try a different SSRI that's middle of the spectrum in terms of activating or sedating like Celexa. Hopefully he will tolerate this well without the bored sexual side effects. Would start at 10 mg dose daily. Follow up 4 weeks.

## 2017-11-12 ENCOUNTER — Telehealth: Payer: Self-pay | Admitting: Physician Assistant

## 2017-11-12 DIAGNOSIS — F419 Anxiety disorder, unspecified: Principal | ICD-10-CM

## 2017-11-12 DIAGNOSIS — F32A Depression, unspecified: Secondary | ICD-10-CM

## 2017-11-12 DIAGNOSIS — F329 Major depressive disorder, single episode, unspecified: Secondary | ICD-10-CM

## 2017-11-12 MED ORDER — FLUOXETINE HCL 20 MG PO TABS: 20.0000 mg | ORAL_TABLET | Freq: Every day | ORAL | 1 refills | Status: DC

## 2017-11-12 NOTE — Telephone Encounter (Signed)
Pt's spouse returned call. She said that office called and scheduled pt for a sooner date. She said that pt is out of town in meetings so he is unable to come in at his original apt. (changed apt back) spouse would like to know if pt should continue taking medication until his apt next week or should he d/c?    Please advise.    CB: [phone]

## 2017-11-12 NOTE — Telephone Encounter (Signed)
Copied from CRM [phone]. Topic: Quick Communication - See Telephone Encounter >> Nov 12, 2017 10:20 AM Clack, Princella PellegriniJessica D wrote: CRM for notification. See Telephone encounter for:  Annabelle HarmanDana pt wife states she would like Daphine DeutscherMartin or his nurse to give her a call. She states she has some questions about pt medication citalopram (CELEXA) 10 MG tablet [[id number]][[id number]]. She feel since the pt has started the med his symptoms have got worse.  11/12/17.

## 2017-11-12 NOTE — Telephone Encounter (Signed)
Stop the Citalopram. Would recommend we start Fluoxetine 20 mg daily. Verify with patient that he has not taken before. Ok to send in 30 with 1. Needs to see me as soon as he is back in town. ER for any worsening symptoms.

## 2017-11-12 NOTE — Telephone Encounter (Signed)
Advised patient spouse since symptoms were worsening on Citalopram. Will stop the Citalopram and start Prozac 20 mg daily. Advised if symptoms worsened to go to the ER. Rx sent to the pharmacy. She was agreeable He will keep his appt on 11/20/17

## 2017-11-12 NOTE — Telephone Encounter (Signed)
Spoke with spouse and she states his symptoms worsened over the weekend. He states he is not happy, yelling and screaming at work, having panic attacks, feeling sad and depressed, lack of motivation. Layed in the bed all weekend, and angry. He has been working hard on diet and exercise. He is down 20 lbs. Lexapro worked but caused sexual side effects. See not below for recommendations since patient unable to come in sooner for an appointment?

## 2017-11-13 ENCOUNTER — Ambulatory Visit: Payer: PRIVATE HEALTH INSURANCE | Admitting: Physician Assistant

## 2017-11-20 ENCOUNTER — Ambulatory Visit (INDEPENDENT_AMBULATORY_CARE_PROVIDER_SITE_OTHER): Payer: PRIVATE HEALTH INSURANCE | Admitting: Physician Assistant

## 2017-11-20 ENCOUNTER — Encounter: Payer: Self-pay | Admitting: Physician Assistant

## 2017-11-20 ENCOUNTER — Other Ambulatory Visit: Payer: Self-pay

## 2017-11-20 ENCOUNTER — Ambulatory Visit: Payer: PRIVATE HEALTH INSURANCE | Admitting: Physician Assistant

## 2017-11-20 ENCOUNTER — Other Ambulatory Visit: Payer: Self-pay | Admitting: Emergency Medicine

## 2017-11-20 VITALS — BP 116/76 | HR 58 | Temp 98.1°F | Resp 16 | Ht 72.0 in | Wt 222.6 lb

## 2017-11-20 DIAGNOSIS — H6521 Chronic serous otitis media, right ear: Secondary | ICD-10-CM | POA: Insufficient documentation

## 2017-11-20 DIAGNOSIS — I10 Essential (primary) hypertension: Secondary | ICD-10-CM

## 2017-11-20 DIAGNOSIS — R7989 Other specified abnormal findings of blood chemistry: Secondary | ICD-10-CM

## 2017-11-20 DIAGNOSIS — F411 Generalized anxiety disorder: Secondary | ICD-10-CM

## 2017-11-20 DIAGNOSIS — R945 Abnormal results of liver function studies: Principal | ICD-10-CM

## 2017-11-20 DIAGNOSIS — E559 Vitamin D deficiency, unspecified: Secondary | ICD-10-CM

## 2017-11-20 LAB — HEPATIC FUNCTION PANEL
ALT: 49 U/L (ref 0–53)
AST: 26 U/L (ref 0–37)
Albumin: 4.5 g/dL (ref 3.5–5.2)
Alkaline Phosphatase: 69 U/L (ref 39–117)
BILIRUBIN DIRECT: 0.1 mg/dL (ref 0.0–0.3)
TOTAL PROTEIN: 6.8 g/dL (ref 6.0–8.3)
Total Bilirubin: 1.1 mg/dL (ref 0.2–1.2)

## 2017-11-20 NOTE — Patient Instructions (Signed)
Please go to the lab today for blood work.  I will call you with your results. We will alter treatment regimen(s) if indicated by your results.   Please continue the Fluoxetine as directed. Follow-up for anxiety in 3 months.  Return sooner if needed.  Please keep your phone on. You will be contacted for assessment by ENT.  Please continue Flonase. Start a saline nasal rinse daily. Continue Zyrtec. Can use Afrin for a couple of days if symptoms are severe.  Trying to avoid oral decongestants due to BP.

## 2017-11-20 NOTE — Addendum Note (Signed)
Addended byDene Gentry: PETERMAN, AMY M on: 11/20/2017 08:45 AM   Modules accepted: Orders

## 2017-11-20 NOTE — Assessment & Plan Note (Signed)
Continue Flonase and Zyrtec. Nasal decongestant recommended for 2 days max when symptoms are more severe. Needs ENT assessment for tube placement giving chronicity and recurrence.

## 2017-11-20 NOTE — Assessment & Plan Note (Signed)
Much improved with Fluoxetine. Will keep at current dose for now. Follow-up in 3 months.

## 2017-11-20 NOTE — Assessment & Plan Note (Signed)
BP normotensive. Asymptomatic. Continue current regimen.  

## 2017-11-20 NOTE — Progress Notes (Signed)
Patient presents to clinic today for follow-up of GAD. At last visit, we attempted to start patient on Trintellix but was too expensive even with voucher. As such patient was started on Fluoxetine 20 mg daily. Is taking as directed and tolerating well. Notes that anxiety is improving. Feels much calmer now. Is staying active -- running on treadmill 3 x week. Is watching diet. Denies depressed mood, anhedonia, SI/HI.  Past Medical History:  Diagnosis Date  . Anxiety   . Celiac disease   . Diverticulitis    per PET scan asymptomatic  . Hypertension    diet and exercise controlled.  Also has white coat syndrome.  Chase Mcclure. Shortness of breath dyspnea    recently    Current Outpatient Medications on File Prior to Visit  Medication Sig Dispense Refill  . FLUoxetine (PROZAC) 20 MG tablet Take 1 tablet (20 mg total) by mouth daily. 30 tablet 1  . montelukast (SINGULAIR) 10 MG tablet Take 10 mg by mouth at bedtime.    Chase Mcclure. olmesartan (BENICAR) 20 MG tablet Take 1 tablet (20 mg total) by mouth daily. 90 tablet 1  . vitamin B-12 (CYANOCOBALAMIN) 1000 MCG tablet Take 1,000 mcg by mouth daily.    . Vitamin D, Ergocalciferol, 2000 units CAPS Take 2 capsules by mouth daily. 30 capsule 0   No current facility-administered medications on file prior to visit.     Allergies  Allergen Reactions  . Gluten Meal Other (See Comments)    Celiac disease    Family History  Problem Relation Age of Onset  . Aneurysm Mother        brain  . Drug abuse Mother   . Rheum arthritis Father   . Prostate cancer Father [id number]  . Stroke Father [id number]       preceded bymultiple TIAs  . Hypertension Father   . Hearing loss Father   . Dementia Father   . Colon polyps Father        late 5760s.  . Cancer Maternal Aunt        x2  . Hearing loss Brother   . Hypertension Brother   . Diabetes Daughter     Social History   Socioeconomic History  . Marital status: Married    Spouse name: None  . Number of children: 3  .  Years of education: None  . Highest education level: None  Social Needs  . Financial resource strain: None  . Food insecurity - worry: None  . Food insecurity - inability: None  . Transportation needs - medical: None  . Transportation needs - non-medical: None  Occupational History  . Occupation: Government social research officercommerical roofing  Tobacco Use  . Smoking status: Former Smoker    Packs/day: 0.25    Years: 1.00    Pack years: 0.25    Types: Cigarettes    Last attempt to quit: 09/04/1988    Years since quitting: 29.2  . Smokeless tobacco: Former NeurosurgeonUser    Types: Chew    Quit date: 09/04/1998  Substance and Sexual Activity  . Alcohol use: Yes    Alcohol/week: 1.2 oz    Types: 2 Glasses of wine per week    Comment: once a month  . Drug use: No  . Sexual activity: Yes  Other Topics Concern  . None  Social History Narrative   Very active job   Review of Systems - See HPI.  All other ROS are negative.  BP 116/76   Pulse (!) 58  Temp 98.1 F (36.7 C) (Oral)   Resp 16   Ht 6' (1.829 m)   Wt 222 lb 9.6 oz (101 kg)   SpO2 98%   BMI 30.19 kg/m   Physical Exam  Constitutional: He is oriented to person, place, and time and well-developed, well-nourished, and in no distress.  HENT:  Head: Normocephalic and atraumatic.  Right Ear: A middle ear effusion (Chronic serous) is present.  Left Ear: Tympanic membrane normal.  Nose: Nose normal.  Mouth/Throat: Uvula is midline, oropharynx is clear and moist and mucous membranes are normal.  Cardiovascular: Normal rate, regular rhythm, normal heart sounds and intact distal pulses.  Pulmonary/Chest: Breath sounds normal. No respiratory distress. He has no wheezes. He has no rales. He exhibits no tenderness.  Neurological: He is alert and oriented to person, place, and time.  Skin: Skin is warm and dry. No rash noted.  Psychiatric: Affect normal.  Vitals reviewed.  Recent Results (from the past 2160 hour(s))  CBC with Differential/Platelet     Status:  None   Collection Time: 10/23/17  9:25 AM  Result Value Ref Range   WBC 6.7 4.0 - 10.5 K/uL   RBC 4.87 4.22 - 5.81 Mil/uL   Hemoglobin 16.0 13.0 - 17.0 g/dL   HCT 16.1 09.6 - 04.5 %   MCV 93.8 78.0 - 100.0 fl   MCHC 34.9 30.0 - 36.0 g/dL   RDW 40.9 81.1 - 91.4 %   Platelets 246.0 150.0 - 400.0 K/uL   Neutrophils Relative % 59.2 43.0 - 77.0 %   Lymphocytes Relative 29.2 12.0 - 46.0 %   Monocytes Relative 7.2 3.0 - 12.0 %   Eosinophils Relative 3.9 0.0 - 5.0 %   Basophils Relative 0.5 0.0 - 3.0 %   Neutro Abs 4.0 1.4 - 7.7 K/uL   Lymphs Abs 2.0 0.7 - 4.0 K/uL   Monocytes Absolute 0.5 0.1 - 1.0 K/uL   Eosinophils Absolute 0.3 0.0 - 0.7 K/uL   Basophils Absolute 0.0 0.0 - 0.1 K/uL  Comprehensive metabolic panel     Status: Abnormal   Collection Time: 10/23/17  9:25 AM  Result Value Ref Range   Sodium 139 135 - 145 mEq/L   Potassium 4.2 3.5 - 5.1 mEq/L   Chloride 103 96 - 112 mEq/L   CO2 31 19 - 32 mEq/L   Glucose, Bld 109 (H) 70 - 99 mg/dL   BUN 11 6 - 23 mg/dL   Creatinine, Ser 7.82 0.40 - 1.50 mg/dL   Total Bilirubin 1.0 0.2 - 1.2 mg/dL   Alkaline Phosphatase 67 39 - 117 U/L   AST 33 0 - 37 U/L   ALT 65 (H) 0 - 53 U/L   Total Protein 7.3 6.0 - 8.3 g/dL   Albumin 4.6 3.5 - 5.2 g/dL   Calcium 9.9 8.4 - 95.6 mg/dL   GFR 213.08 >65.78 mL/min  Lipid panel     Status: Abnormal   Collection Time: 10/23/17  9:25 AM  Result Value Ref Range   Cholesterol 189 0 - 200 mg/dL    Comment: ATP III Classification       Desirable:  < 200 mg/dL               Borderline High:  200 - 239 mg/dL          High:  > = 469 mg/dL   Triglycerides 629.5 (H) 0.0 - 149.0 mg/dL    Comment: Normal:  <284 mg/dLBorderline High:  150 -  199 mg/dL   HDL 16.10 (L) >96.04 mg/dL   VLDL 54.0 0.0 - 98.1 mg/dL   LDL Cholesterol 191 (H) 0 - 99 mg/dL   Total CHOL/HDL Ratio 6     Comment:                Men          Women1/2 Average Risk     3.4          3.3Average Risk          5.0          4.42X Average Risk           9.6          7.13X Average Risk          15.0          11.0                       NonHDL 159.61     Comment: NOTE:  Non-HDL goal should be 30 mg/dL higher than patient's LDL goal (i.e. LDL goal of < 70 mg/dL, would have non-HDL goal of < 100 mg/dL)  Hemoglobin Y7W     Status: None   Collection Time: 10/23/17  9:25 AM  Result Value Ref Range   Hgb A1c MFr Bld 5.0 4.6 - 6.5 %    Comment: Glycemic Control Guidelines for People with Diabetes:Non Diabetic:  <6%Goal of Therapy: <7%Additional Action Suggested:  >8%   PSA     Status: None   Collection Time: 10/23/17  9:25 AM  Result Value Ref Range   PSA 1.11 0.10 - 4.00 ng/mL    Comment: Test performed using Access Hybritech PSA Assay, a parmagnetic partical, chemiluminecent immunoassay.  TSH     Status: None   Collection Time: 10/23/17  9:25 AM  Result Value Ref Range   TSH 3.98 0.35 - 4.50 uIU/mL  Vitamin D (25 hydroxy)     Status: Abnormal   Collection Time: 10/23/17  9:25 AM  Result Value Ref Range   VITD 29.33 (L) 30.00 - 100.00 ng/mL  B12     Status: None   Collection Time: 10/23/17  9:25 AM  Result Value Ref Range   Vitamin B-12 501 211 - 911 pg/mL  Testosterone     Status: None   Collection Time: 10/23/17  9:25 AM  Result Value Ref Range   Testosterone 408.43 300.00 - 890.00 ng/dL   Assessment/Plan: Essential hypertension BP normotensive. Asymptomatic. Continue current regimen.   GAD (generalized anxiety disorder) Much improved with Fluoxetine. Will keep at current dose for now. Follow-up in 3 months.   Right chronic serous otitis media Continue Flonase and Zyrtec. Nasal decongestant recommended for 2 days max when symptoms are more severe. Needs ENT assessment for tube placement giving chronicity and recurrence.     Piedad Climes, PA-C

## 2017-11-21 LAB — VITAMIN D 25 HYDROXY (VIT D DEFICIENCY, FRACTURES): VITD: 37.97 ng/mL (ref 30.00–100.00)

## 2017-12-24 ENCOUNTER — Encounter: Payer: Self-pay | Admitting: Physician Assistant

## 2017-12-24 ENCOUNTER — Other Ambulatory Visit: Payer: Self-pay

## 2017-12-24 ENCOUNTER — Ambulatory Visit (INDEPENDENT_AMBULATORY_CARE_PROVIDER_SITE_OTHER): Payer: PRIVATE HEALTH INSURANCE | Admitting: Physician Assistant

## 2017-12-24 VITALS — BP 116/80 | HR 78 | Temp 98.9°F | Resp 16 | Ht 72.0 in | Wt 216.0 lb

## 2017-12-24 DIAGNOSIS — B9689 Other specified bacterial agents as the cause of diseases classified elsewhere: Secondary | ICD-10-CM

## 2017-12-24 DIAGNOSIS — J208 Acute bronchitis due to other specified organisms: Secondary | ICD-10-CM

## 2017-12-24 DIAGNOSIS — H6983 Other specified disorders of Eustachian tube, bilateral: Secondary | ICD-10-CM | POA: Diagnosis not present

## 2017-12-24 MED ORDER — DOXYCYCLINE HYCLATE 100 MG PO CAPS: 100.0000 mg | ORAL_CAPSULE | Freq: Two times a day (BID) | ORAL | 0 refills | Status: DC

## 2017-12-24 MED ORDER — PREDNISONE 10 MG PO TABS: ORAL_TABLET | ORAL | 0 refills | Status: DC

## 2017-12-24 NOTE — Progress Notes (Signed)
Patient presents to clinic today c/o 6 days of cough, sore throat, nasal congestion and sinus pressure/pain, dental pain. Endorses ear pressure and decreasing hearing with L-sided tinnitus. Endorses worsening chest congestion with cough that is productive of thick yellow sputum. Notes chest wall tenderness. Endorses Tmax -- 102. Is taking Guaifenesin, Advil. Denies sick contact.   Past Medical History:  Diagnosis Date  . Anxiety   . Celiac disease   . Diverticulitis    per PET scan asymptomatic  . Hypertension    diet and exercise controlled.  Also has white coat syndrome.  Marland Kitchen Shortness of breath dyspnea    recently    Current Outpatient Medications on File Prior to Visit  Medication Sig Dispense Refill  . FLUoxetine (PROZAC) 20 MG tablet Take 1 tablet (20 mg total) by mouth daily. 30 tablet 1  . montelukast (SINGULAIR) 10 MG tablet Take 10 mg by mouth at bedtime.    Marland Kitchen olmesartan (BENICAR) 20 MG tablet Take 1 tablet (20 mg total) by mouth daily. 90 tablet 1  . vitamin B-12 (CYANOCOBALAMIN) 1000 MCG tablet Take 1,000 mcg by mouth daily.    . Vitamin D, Ergocalciferol, 2000 units CAPS Take 2 capsules by mouth daily. 30 capsule 0   No current facility-administered medications on file prior to visit.     Allergies  Allergen Reactions  . Gluten Meal Other (See Comments)    Celiac disease    Family History  Problem Relation Age of Onset  . Aneurysm Mother        brain  . Drug abuse Mother   . Rheum arthritis Father   . Prostate cancer Father 24  . Stroke Father 12       preceded bymultiple TIAs  . Hypertension Father   . Hearing loss Father   . Dementia Father   . Colon polyps Father        late 51s.  . Cancer Maternal Aunt        x2  . Hearing loss Brother   . Hypertension Brother   . Diabetes Daughter     Social History   Socioeconomic History  . Marital status: Married    Spouse name: Not on file  . Number of children: 3  . Years of education: Not on file  .  Highest education level: Not on file  Occupational History  . Occupation: Government social research officer  Social Needs  . Financial resource strain: Not on file  . Food insecurity:    Worry: Not on file    Inability: Not on file  . Transportation needs:    Medical: Not on file    Non-medical: Not on file  Tobacco Use  . Smoking status: Former Smoker    Packs/day: 0.25    Years: 1.00    Pack years: 0.25    Types: Cigarettes    Last attempt to quit: 09/04/1988    Years since quitting: 29.3  . Smokeless tobacco: Former Neurosurgeon    Types: Chew    Quit date: 09/04/1998  Substance and Sexual Activity  . Alcohol use: Yes    Alcohol/week: 1.2 oz    Types: 2 Glasses of wine per week    Comment: once a month  . Drug use: No  . Sexual activity: Yes  Lifestyle  . Physical activity:    Days per week: Not on file    Minutes per session: Not on file  . Stress: Not on file  Relationships  . Social connections:  Talks on phone: Not on file    Gets together: Not on file    Attends religious service: Not on file    Active member of club or organization: Not on file    Attends meetings of clubs or organizations: Not on file    Relationship status: Not on file  Other Topics Concern  . Not on file  Social History Narrative   Very active job   Review of Systems - See HPI.  All other ROS are negative.  BP 116/80   Pulse 78   Temp 98.9 F (37.2 C) (Oral)   Resp 16   Ht 6' (1.829 m)   Wt 216 lb (98 kg)   SpO2 98%   BMI 29.29 kg/m   Physical Exam  Constitutional: He is oriented to person, place, and time. He appears well-developed and well-nourished.  HENT:  Head: Normocephalic and atraumatic.  Right Ear: A middle ear effusion (serous) is present.  Left Ear: A middle ear effusion (serous) is present.  Nose: Mucosal edema and rhinorrhea present. Right sinus exhibits no maxillary sinus tenderness and no frontal sinus tenderness. Left sinus exhibits no maxillary sinus tenderness and no frontal  sinus tenderness.  Mouth/Throat: Uvula is midline, oropharynx is clear and moist and mucous membranes are normal.  Neck: Neck supple.  Cardiovascular: Normal rate, regular rhythm, normal heart sounds and intact distal pulses.  Pulmonary/Chest: Effort normal and breath sounds normal.  Lymphadenopathy:    He has no cervical adenopathy.  Neurological: He is alert and oriented to person, place, and time.  Skin: Skin is warm.   Recent Results (from the past 2160 hour(s))  CBC with Differential/Platelet     Status: None   Collection Time: 10/23/17  9:25 AM  Result Value Ref Range   WBC 6.7 4.0 - 10.5 K/uL   RBC 4.87 4.22 - 5.81 Mil/uL   Hemoglobin 16.0 13.0 - 17.0 g/dL   HCT 16.1 09.6 - 04.5 %   MCV 93.8 78.0 - 100.0 fl   MCHC 34.9 30.0 - 36.0 g/dL   RDW 40.9 81.1 - 91.4 %   Platelets 246.0 150.0 - 400.0 K/uL   Neutrophils Relative % 59.2 43.0 - 77.0 %   Lymphocytes Relative 29.2 12.0 - 46.0 %   Monocytes Relative 7.2 3.0 - 12.0 %   Eosinophils Relative 3.9 0.0 - 5.0 %   Basophils Relative 0.5 0.0 - 3.0 %   Neutro Abs 4.0 1.4 - 7.7 K/uL   Lymphs Abs 2.0 0.7 - 4.0 K/uL   Monocytes Absolute 0.5 0.1 - 1.0 K/uL   Eosinophils Absolute 0.3 0.0 - 0.7 K/uL   Basophils Absolute 0.0 0.0 - 0.1 K/uL  Comprehensive metabolic panel     Status: Abnormal   Collection Time: 10/23/17  9:25 AM  Result Value Ref Range   Sodium 139 135 - 145 mEq/L   Potassium 4.2 3.5 - 5.1 mEq/L   Chloride 103 96 - 112 mEq/L   CO2 31 19 - 32 mEq/L   Glucose, Bld 109 (H) 70 - 99 mg/dL   BUN 11 6 - 23 mg/dL   Creatinine, Ser 7.82 0.40 - 1.50 mg/dL   Total Bilirubin 1.0 0.2 - 1.2 mg/dL   Alkaline Phosphatase 67 39 - 117 U/L   AST 33 0 - 37 U/L   ALT 65 (H) 0 - 53 U/L   Total Protein 7.3 6.0 - 8.3 g/dL   Albumin 4.6 3.5 - 5.2 g/dL   Calcium 9.9 8.4 -  10.5 mg/dL   GFR 742.59104.63 >56.38>60.00 mL/min  Lipid panel     Status: Abnormal   Collection Time: 10/23/17  9:25 AM  Result Value Ref Range   Cholesterol 189 0 - 200  mg/dL    Comment: ATP III Classification       Desirable:  < 200 mg/dL               Borderline High:  200 - 239 mg/dL          High:  > = 756240 mg/dL   Triglycerides 433.2157.0 (H) 0.0 - 149.0 mg/dL    Comment: Normal:  <951<150 mg/dLBorderline High:  150 - 199 mg/dL   HDL 88.4129.10 (L) >66.06>39.00 mg/dL   VLDL 30.131.4 0.0 - 60.140.0 mg/dL   LDL Cholesterol 093128 (H) 0 - 99 mg/dL   Total CHOL/HDL Ratio 6     Comment:                Men          Women1/2 Average Risk     3.4          3.3Average Risk          5.0          4.42X Average Risk          9.6          7.13X Average Risk          15.0          11.0                       NonHDL 159.61     Comment: NOTE:  Non-HDL goal should be 30 mg/dL higher than patient's LDL goal (i.e. LDL goal of < 70 mg/dL, would have non-HDL goal of < 100 mg/dL)  Hemoglobin A3FA1c     Status: None   Collection Time: 10/23/17  9:25 AM  Result Value Ref Range   Hgb A1c MFr Bld 5.0 4.6 - 6.5 %    Comment: Glycemic Control Guidelines for People with Diabetes:Non Diabetic:  <6%Goal of Therapy: <7%Additional Action Suggested:  >8%   PSA     Status: None   Collection Time: 10/23/17  9:25 AM  Result Value Ref Range   PSA 1.11 0.10 - 4.00 ng/mL    Comment: Test performed using Access Hybritech PSA Assay, a parmagnetic partical, chemiluminecent immunoassay.  TSH     Status: None   Collection Time: 10/23/17  9:25 AM  Result Value Ref Range   TSH 3.98 0.35 - 4.50 uIU/mL  Vitamin D (25 hydroxy)     Status: Abnormal   Collection Time: 10/23/17  9:25 AM  Result Value Ref Range   VITD 29.33 (L) 30.00 - 100.00 ng/mL  B12     Status: None   Collection Time: 10/23/17  9:25 AM  Result Value Ref Range   Vitamin B-12 501 211 - 911 pg/mL  Testosterone     Status: None   Collection Time: 10/23/17  9:25 AM  Result Value Ref Range   Testosterone 408.43 300.00 - 890.00 ng/dL  Vitamin D (25 hydroxy)     Status: None   Collection Time: 11/20/17  8:45 AM  Result Value Ref Range   VITD 37.97 30.00 - 100.00  ng/mL  Hepatic function panel     Status: None   Collection Time: 11/20/17  8:45 AM  Result Value Ref Range   Total Bilirubin 1.1 0.2 -  1.2 mg/dL   Bilirubin, Direct 0.1 0.0 - 0.3 mg/dL   Alkaline Phosphatase 69 39 - 117 U/L   AST 26 0 - 37 U/L   ALT 49 0 - 53 U/L   Total Protein 6.8 6.0 - 8.3 g/dL   Albumin 4.5 3.5 - 5.2 g/dL    Assessment/Plan: 1. Acute bacterial bronchitis Start Doxycycline. Supportive measures and OTC medications reviewed. - doxycycline (VIBRAMYCIN) 100 MG capsule; Take 1 capsule (100 mg total) by mouth 2 (two) times daily.  Dispense: 14 capsule; Refill: 0 - predniSONE (DELTASONE) 10 MG tablet; Take 3 tablets by mouth (30 mg) x 3 days, then 2 tablets x 3 days, then 1 tablet x 3 days.  Dispense: 18 tablet; Refill: 0  2. Dysfunction of both eustachian tubes Significant and chronic. Start prednisone taper. Resume Flonase. Follow-up with ENT if not improving.  - predniSONE (DELTASONE) 10 MG tablet; Take 3 tablets by mouth (30 mg) x 3 days, then 2 tablets x 3 days, then 1 tablet x 3 days.  Dispense: 18 tablet; Refill: 0   Piedad Climes, PA-C

## 2017-12-24 NOTE — Patient Instructions (Signed)
Take antibiotic (Doxycycline) as directed.  Increase fluids.  Get plenty of rest. Use Mucinex-DM for congestion. Take the steroid as directed and resume Flonase. Take a daily probiotic (I recommend Align or Culturelle, but even Activia Yogurt may be beneficial).  A humidifier placed in the bedroom may offer some relief for a dry, scratchy throat of nasal irritation.  Read information below on acute bronchitis. Please call or return to clinic if symptoms are not improving.  Acute Bronchitis Bronchitis is when the airways that extend from the windpipe into the lungs get red, puffy, and painful (inflamed). Bronchitis often causes thick spit (mucus) to develop. This leads to a cough. A cough is the most common symptom of bronchitis. In acute bronchitis, the condition usually begins suddenly and goes away over time (usually in 2 weeks). Smoking, allergies, and asthma can make bronchitis worse. Repeated episodes of bronchitis may cause more lung problems.  HOME CARE  Rest.  Drink enough fluids to keep your pee (urine) clear or pale yellow (unless you need to limit fluids as told by your doctor).  Only take over-the-counter or prescription medicines as told by your doctor.  Avoid smoking and secondhand smoke. These can make bronchitis worse. If you are a smoker, think about using nicotine gum or skin patches. Quitting smoking will help your lungs heal faster.  Reduce the chance of getting bronchitis again by:  Washing your hands often.  Avoiding people with cold symptoms.  Trying not to touch your hands to your mouth, nose, or eyes.  Follow up with your doctor as told.  GET HELP IF: Your symptoms do not improve after 1 week of treatment. Symptoms include:  Cough.  Fever.  Coughing up thick spit.  Body aches.  Chest congestion.  Chills.  Shortness of breath.  Sore throat.  GET HELP RIGHT AWAY IF:   You have an increased fever.  You have chills.  You have severe shortness of  breath.  You have bloody thick spit (sputum).  You throw up (vomit) often.  You lose too much body fluid (dehydration).  You have a severe headache.  You faint.  MAKE SURE YOU:   Understand these instructions.  Will watch your condition.  Will get help right away if you are not doing well or get worse. Document Released: 02/07/2008 Document Revised: 04/23/2013 Document Reviewed: 02/11/2013 [facility] Patient Information 2015 [employer], [place]. This information is not intended to replace advice given to you by your health care provider. Make sure you discuss any questions you have with your health care provider.

## 2017-12-31 ENCOUNTER — Ambulatory Visit (INDEPENDENT_AMBULATORY_CARE_PROVIDER_SITE_OTHER): Payer: PRIVATE HEALTH INSURANCE | Admitting: Physician Assistant

## 2017-12-31 ENCOUNTER — Encounter: Payer: Self-pay | Admitting: Physician Assistant

## 2017-12-31 ENCOUNTER — Other Ambulatory Visit: Payer: Self-pay

## 2017-12-31 VITALS — BP 110/78 | HR 65 | Temp 98.3°F | Resp 16 | Ht 72.0 in | Wt 216.0 lb

## 2017-12-31 DIAGNOSIS — R05 Cough: Secondary | ICD-10-CM

## 2017-12-31 DIAGNOSIS — H6506 Acute serous otitis media, recurrent, bilateral: Secondary | ICD-10-CM | POA: Diagnosis not present

## 2017-12-31 DIAGNOSIS — R058 Other specified cough: Secondary | ICD-10-CM

## 2017-12-31 DIAGNOSIS — J302 Other seasonal allergic rhinitis: Secondary | ICD-10-CM

## 2017-12-31 MED ORDER — FEXOFENADINE-PSEUDOEPHED ER 60-120 MG PO TB12: 1.0000 | ORAL_TABLET | Freq: Every day | ORAL | 1 refills | Status: DC

## 2017-12-31 MED ORDER — HYDROCOD POLST-CPM POLST ER 10-8 MG/5ML PO SUER: 5.0000 mL | Freq: Two times a day (BID) | ORAL | 0 refills | Status: DC | PRN

## 2017-12-31 NOTE — Progress Notes (Signed)
Patient presents to clinic today c/o continued cough, sinus congestion, chest congestion, ear fullness and fatigue s/p treatment for acute bacterial bronchitis with Doxycycline and a steroid taper. Endorses taking Mucinex as well. States he is unable to get anything up. Notes allergy symptoms are horrendous despite taking Singulair and Flonase currently. Is not taking an antihistamine or decongestant. Denies fever, chills. Denies new symptoms. .   Past Medical History:  Diagnosis Date  . Anxiety   . Celiac disease   . Diverticulitis    per PET scan asymptomatic  . Hypertension    diet and exercise controlled.  Also has white coat syndrome.  Marland Kitchen Shortness of breath dyspnea    recently    Current Outpatient Medications on File Prior to Visit  Medication Sig Dispense Refill  . FLUoxetine (PROZAC) 20 MG tablet Take 1 tablet (20 mg total) by mouth daily. 30 tablet 1  . montelukast (SINGULAIR) 10 MG tablet Take 10 mg by mouth at bedtime.    Marland Kitchen olmesartan (BENICAR) 20 MG tablet Take 1 tablet (20 mg total) by mouth daily. 90 tablet 1  . vitamin B-12 (CYANOCOBALAMIN) 1000 MCG tablet Take 1,000 mcg by mouth daily.    . Vitamin D, Ergocalciferol, 2000 units CAPS Take 2 capsules by mouth daily. 30 capsule 0   No current facility-administered medications on file prior to visit.     Allergies  Allergen Reactions  . Gluten Meal Other (See Comments)    Celiac disease    Family History  Problem Relation Age of Onset  . Aneurysm Mother        brain  . Drug abuse Mother   . Rheum arthritis Father   . Prostate cancer Father 60  . Stroke Father 72       preceded bymultiple TIAs  . Hypertension Father   . Hearing loss Father   . Dementia Father   . Colon polyps Father        late 4s.  . Cancer Maternal Aunt        x2  . Hearing loss Brother   . Hypertension Brother   . Diabetes Daughter     Social History   Socioeconomic History  . Marital status: Married    Spouse name: Not on  file  . Number of children: 3  . Years of education: Not on file  . Highest education level: Not on file  Occupational History  . Occupation: Government social research officer  Social Needs  . Financial resource strain: Not on file  . Food insecurity:    Worry: Not on file    Inability: Not on file  . Transportation needs:    Medical: Not on file    Non-medical: Not on file  Tobacco Use  . Smoking status: Former Smoker    Packs/day: 0.25    Years: 1.00    Pack years: 0.25    Types: Cigarettes    Last attempt to quit: 09/04/1988    Years since quitting: 29.3  . Smokeless tobacco: Former Neurosurgeon    Types: Chew    Quit date: 09/04/1998  Substance and Sexual Activity  . Alcohol use: Yes    Alcohol/week: 1.2 oz    Types: 2 Glasses of wine per week    Comment: once a month  . Drug use: No  . Sexual activity: Yes  Lifestyle  . Physical activity:    Days per week: Not on file    Minutes per session: Not on file  . Stress:  Not on file  Relationships  . Social connections:    Talks on phone: Not on file    Gets together: Not on file    Attends religious service: Not on file    Active member of club or organization: Not on file    Attends meetings of clubs or organizations: Not on file    Relationship status: Not on file  Other Topics Concern  . Not on file  Social History Narrative   Very active job   Review of Systems - See HPI.  All other ROS are negative.  BP 110/78   Pulse 65   Temp 98.3 F (36.8 C) (Oral)   Resp 16   Ht 6' (1.829 m)   Wt 216 lb (98 kg)   SpO2 98%   BMI 29.29 kg/m   Physical Exam  Constitutional: He is oriented to person, place, and time. He appears well-developed and well-nourished.  HENT:  Head: Normocephalic and atraumatic.  Right Ear: External ear and ear canal normal. Tympanic membrane is not erythematous, not retracted and not bulging. A middle ear effusion (serous) is present.  Left Ear: Ear canal normal. Tympanic membrane is retracted. Tympanic  membrane is not erythematous and not bulging. A middle ear effusion (serous) is present.  Nose: Mucosal edema and rhinorrhea present. Right sinus exhibits no maxillary sinus tenderness and no frontal sinus tenderness. Left sinus exhibits no maxillary sinus tenderness and no frontal sinus tenderness.  Eyes: Conjunctivae are normal.  Neck: Neck supple.  Cardiovascular: Normal rate, regular rhythm and normal heart sounds.  Pulmonary/Chest: Effort normal and breath sounds normal. No stridor. No respiratory distress. He has no wheezes. He has no rales. He exhibits no tenderness.  Lymphadenopathy:    He has no cervical adenopathy.  Neurological: He is alert and oriented to person, place, and time.  Skin: Skin is dry.  Vitals reviewed.  Recent Results (from the past 2160 hour(s))  CBC with Differential/Platelet     Status: None   Collection Time: 10/23/17  9:25 AM  Result Value Ref Range   WBC 6.7 4.0 - 10.5 K/uL   RBC 4.87 4.22 - 5.81 Mil/uL   Hemoglobin 16.0 13.0 - 17.0 g/dL   HCT 16.1 09.6 - 04.5 %   MCV 93.8 78.0 - 100.0 fl   MCHC 34.9 30.0 - 36.0 g/dL   RDW 40.9 81.1 - 91.4 %   Platelets 246.0 150.0 - 400.0 K/uL   Neutrophils Relative % 59.2 43.0 - 77.0 %   Lymphocytes Relative 29.2 12.0 - 46.0 %   Monocytes Relative 7.2 3.0 - 12.0 %   Eosinophils Relative 3.9 0.0 - 5.0 %   Basophils Relative 0.5 0.0 - 3.0 %   Neutro Abs 4.0 1.4 - 7.7 K/uL   Lymphs Abs 2.0 0.7 - 4.0 K/uL   Monocytes Absolute 0.5 0.1 - 1.0 K/uL   Eosinophils Absolute 0.3 0.0 - 0.7 K/uL   Basophils Absolute 0.0 0.0 - 0.1 K/uL  Comprehensive metabolic panel     Status: Abnormal   Collection Time: 10/23/17  9:25 AM  Result Value Ref Range   Sodium 139 135 - 145 mEq/L   Potassium 4.2 3.5 - 5.1 mEq/L   Chloride 103 96 - 112 mEq/L   CO2 31 19 - 32 mEq/L   Glucose, Bld 109 (H) 70 - 99 mg/dL   BUN 11 6 - 23 mg/dL   Creatinine, Ser 7.82 0.40 - 1.50 mg/dL   Total Bilirubin 1.0 0.2 - 1.2 mg/dL  Alkaline Phosphatase 67  39 - 117 U/L   AST 33 0 - 37 U/L   ALT 65 (H) 0 - 53 U/L   Total Protein 7.3 6.0 - 8.3 g/dL   Albumin 4.6 3.5 - 5.2 g/dL   Calcium 9.9 8.4 - 63.8 mg/dL   GFR 756.43 >32.95 mL/min  Lipid panel     Status: Abnormal   Collection Time: 10/23/17  9:25 AM  Result Value Ref Range   Cholesterol 189 0 - 200 mg/dL    Comment: ATP III Classification       Desirable:  < 200 mg/dL               Borderline High:  200 - 239 mg/dL          High:  > = 188 mg/dL   Triglycerides 416.6 (H) 0.0 - 149.0 mg/dL    Comment: Normal:  <063 mg/dLBorderline High:  150 - 199 mg/dL   HDL 01.60 (L) >10.93 mg/dL   VLDL 23.5 0.0 - 57.3 mg/dL   LDL Cholesterol 220 (H) 0 - 99 mg/dL   Total CHOL/HDL Ratio 6     Comment:                Men          Women1/2 Average Risk     3.4          3.3Average Risk          5.0          4.42X Average Risk          9.6          7.13X Average Risk          15.0          11.0                       NonHDL 159.61     Comment: NOTE:  Non-HDL goal should be 30 mg/dL higher than patient's LDL goal (i.e. LDL goal of < 70 mg/dL, would have non-HDL goal of < 100 mg/dL)  Hemoglobin U5K     Status: None   Collection Time: 10/23/17  9:25 AM  Result Value Ref Range   Hgb A1c MFr Bld 5.0 4.6 - 6.5 %    Comment: Glycemic Control Guidelines for People with Diabetes:Non Diabetic:  <6%Goal of Therapy: <7%Additional Action Suggested:  >8%   PSA     Status: None   Collection Time: 10/23/17  9:25 AM  Result Value Ref Range   PSA 1.11 0.10 - 4.00 ng/mL    Comment: Test performed using Access Hybritech PSA Assay, a parmagnetic partical, chemiluminecent immunoassay.  TSH     Status: None   Collection Time: 10/23/17  9:25 AM  Result Value Ref Range   TSH 3.98 0.35 - 4.50 uIU/mL  Vitamin D (25 hydroxy)     Status: Abnormal   Collection Time: 10/23/17  9:25 AM  Result Value Ref Range   VITD 29.33 (L) 30.00 - 100.00 ng/mL  B12     Status: None   Collection Time: 10/23/17  9:25 AM  Result Value Ref Range     Vitamin B-12 501 211 - 911 pg/mL  Testosterone     Status: None   Collection Time: 10/23/17  9:25 AM  Result Value Ref Range   Testosterone 408.43 300.00 - 890.00 ng/dL  Vitamin D (25 hydroxy)     Status: None  Collection Time: 11/20/17  8:45 AM  Result Value Ref Range   VITD 37.97 30.00 - 100.00 ng/mL  Hepatic function panel     Status: None   Collection Time: 11/20/17  8:45 AM  Result Value Ref Range   Total Bilirubin 1.1 0.2 - 1.2 mg/dL   Bilirubin, Direct 0.1 0.0 - 0.3 mg/dL   Alkaline Phosphatase 69 39 - 117 U/L   AST 26 0 - 37 U/L   ALT 49 0 - 53 U/L   Total Protein 6.8 6.0 - 8.3 g/dL   Albumin 4.5 3.5 - 5.2 g/dL    Assessment/Plan: 1. Recurrent acute serous otitis media of both ears Not responding to prednisone taper. Continue Flonase. Start Allegra-D. If not improving in the next couple of weeks, will need ENT/Allergist assessment.   2. Seasonal allergic rhinitis, unspecified trigger Continue Flonase and Singulair. Start allegra-D. Supportive measures reviewed. Allergist if not improving.   3. Allergic cough Continue Mucinex-DM and allergy regimen as noted above. Rx Tussionex.    Piedad Climes, PA-C

## 2017-12-31 NOTE — Patient Instructions (Signed)
Please keep well-hydrated. Continue with Flonase once daily and saline nasal rinses or Neti Pot once daily.  Use Mucinex -DM as directed. Start the Allegra-D short-term, over the next 4-5 days.  Use the cough medication as directed in the evening to help with cough and sleep.   Limit time outdoors. Wear a mask if you have to be outside.   Call me Wednesday to let me know if things are not slightly improving by then. We will need to consult an Allergist.

## 2018-01-01 ENCOUNTER — Other Ambulatory Visit: Payer: Self-pay | Admitting: Physician Assistant

## 2018-01-01 DIAGNOSIS — F419 Anxiety disorder, unspecified: Principal | ICD-10-CM

## 2018-01-01 DIAGNOSIS — F329 Major depressive disorder, single episode, unspecified: Secondary | ICD-10-CM

## 2018-01-06 ENCOUNTER — Encounter: Payer: Self-pay | Admitting: Physician Assistant

## 2018-01-07 ENCOUNTER — Other Ambulatory Visit: Payer: Self-pay | Admitting: Physician Assistant

## 2018-02-19 ENCOUNTER — Other Ambulatory Visit: Payer: Self-pay

## 2018-02-19 ENCOUNTER — Encounter: Payer: Self-pay | Admitting: Physician Assistant

## 2018-02-19 ENCOUNTER — Ambulatory Visit (INDEPENDENT_AMBULATORY_CARE_PROVIDER_SITE_OTHER): Payer: PRIVATE HEALTH INSURANCE | Admitting: Physician Assistant

## 2018-02-19 DIAGNOSIS — F411 Generalized anxiety disorder: Secondary | ICD-10-CM | POA: Diagnosis not present

## 2018-02-19 MED ORDER — FLUOXETINE HCL 40 MG PO CAPS: 40.0000 mg | ORAL_CAPSULE | Freq: Every day | ORAL | 3 refills | Status: DC

## 2018-02-19 NOTE — Assessment & Plan Note (Signed)
Much improved with 40 mg dose of the Prozac. Continue same. Follow-up 6 months for CPE.

## 2018-02-19 NOTE — Patient Instructions (Signed)
I am glad that you are doing so much better.  We will continue the current dose of Prozac. I placed a new prescription for the 40 mg capsules on file at your pharmacy to fill when you run out of current prescription.   Follow-up with me in 6 months. Return sooner if needed.

## 2018-02-19 NOTE — Progress Notes (Signed)
Patient presents to clinic today for follow-up of anxiety. Patient is currently on a regimen of Fluoxetine, increased to 40 mg. Has been taking medication as directed. Endorses significant improvement in mood and anxiety. Denies panic attack. Notes improvement in mood as well.   Past Medical History:  Diagnosis Date  . Anxiety   . Celiac disease   . Diverticulitis    per PET scan asymptomatic  . Hypertension    diet and exercise controlled.  Also has white coat syndrome.  Marland Kitchen. Shortness of breath dyspnea    recently    Current Outpatient Medications on File Prior to Visit  Medication Sig Dispense Refill  . montelukast (SINGULAIR) 10 MG tablet Take 10 mg by mouth at bedtime.    Marland Kitchen. olmesartan (BENICAR) 20 MG tablet TAKE 1 TABLET BY MOUTH EVERY DAY 90 tablet 1  . vitamin B-12 (CYANOCOBALAMIN) 1000 MCG tablet Take 1,000 mcg by mouth daily.    . Vitamin D, Ergocalciferol, 2000 units CAPS Take 2 capsules by mouth daily. 30 capsule 0   No current facility-administered medications on file prior to visit.     Allergies  Allergen Reactions  . Gluten Meal Other (See Comments)    Celiac disease    Family History  Problem Relation Age of Onset  . Aneurysm Mother        brain  . Drug abuse Mother   . Rheum arthritis Father   . Prostate cancer Father [id number]  . Stroke Father [id number]       preceded bymultiple TIAs  . Hypertension Father   . Hearing loss Father   . Dementia Father   . Colon polyps Father        late 4360s.  . Cancer Maternal Aunt        x2  . Hearing loss Brother   . Hypertension Brother   . Diabetes Daughter     Social History   Socioeconomic History  . Marital status: Married    Spouse name: Not on file  . Number of children: 3  . Years of education: Not on file  . Highest education level: Not on file  Occupational History  . Occupation: Government social research officercommerical roofing  Social Needs  . Financial resource strain: Not on file  . Food insecurity:    Worry: Not on file   Inability: Not on file  . Transportation needs:    Medical: Not on file    Non-medical: Not on file  Tobacco Use  . Smoking status: Former Smoker    Packs/day: 0.25    Years: 1.00    Pack years: 0.25    Types: Cigarettes    Last attempt to quit: 09/04/1988    Years since quitting: 29.4  . Smokeless tobacco: Former NeurosurgeonUser    Types: Chew    Quit date: 09/04/1998  Substance and Sexual Activity  . Alcohol use: Yes    Alcohol/week: 1.2 oz    Types: 2 Glasses of wine per week    Comment: once a month  . Drug use: No  . Sexual activity: Yes  Lifestyle  . Physical activity:    Days per week: Not on file    Minutes per session: Not on file  . Stress: Not on file  Relationships  . Social connections:    Talks on phone: Not on file    Gets together: Not on file    Attends religious service: Not on file    Active member of club or organization: Not  on file    Attends meetings of clubs or organizations: Not on file    Relationship status: Not on file  Other Topics Concern  . Not on file  Social History Narrative   Very active job   Review of Systems - See HPI.  All other ROS are negative.  BP 104/70   Pulse 68   Temp 98.2 F (36.8 C) (Oral)   Resp 14   Ht 6' (1.829 m)   Wt 209 lb (94.8 kg)   SpO2 98%   BMI 28.35 kg/m   Physical Exam  Constitutional: He appears well-developed and well-nourished.  HENT:  Head: Normocephalic and atraumatic.  Right Ear: External ear normal.  Left Ear: External ear normal.  Cardiovascular: Normal rate, regular rhythm, normal heart sounds and intact distal pulses.  Pulmonary/Chest: Effort normal and breath sounds normal.  Vitals reviewed.  Assessment/Plan: GAD (generalized anxiety disorder) Much improved with 40 mg dose of the Prozac. Continue same. Follow-up 6 months for CPE.    Piedad Climes, PA-C

## 2018-03-06 ENCOUNTER — Telehealth: Payer: Self-pay | Admitting: Emergency Medicine

## 2018-03-06 NOTE — Telephone Encounter (Signed)
Would recommend that he come in for reassessment. He was already on 40 mg and doing well (had been taking 2 of his 20 mg capsules daily) prior to starting the new 40 mg capsule. Can he come in for assessment on Friday?

## 2018-03-06 NOTE — Telephone Encounter (Signed)
Spoke with patient wife. She and the patient has noticed since patient has started on the 40 mg capsule of Prozac he has felt more sleepy, groggy, anxious and irritable. He has some increased stress.    Copied from CRM [phone]. Topic: Quick Communication - Rx Refill/Question >> Mar 06, 2018  9:04 AM Crist InfanteHarrald, Kathy J wrote: Medication: FLUoxetine (PROZAC) 40 MG capsule Wife called to report the pt picked up the new Rx 40 mg (pt was taking 2 /20 mg) She states pt reports sleeping a lot the first day, and since then every morning pt wakes up very anxious, and irritable. Pt has been taking this 5 days now. Pt is at work and very busy, but asked her to call and see what Selena BattenCody thought, if there could be a difference in the way the med effects him at taking the 40 mg. Pt spoke to the pharmacist, who advised this should be the same. Wife Annabelle HarmanDana states pt was doing so well, and now it is like he isn't taking the medication at all. Please advise.  OK to call wife Annabelle HarmanDana or pt.

## 2018-03-06 NOTE — Telephone Encounter (Signed)
Advised patient to continue taking the current dose of Prozac. If symptoms worsens then to come in for an appointment to discuss. She is agreeable.

## 2018-05-01 ENCOUNTER — Telehealth: Payer: Self-pay | Admitting: Physician Assistant

## 2018-05-01 ENCOUNTER — Encounter: Payer: Self-pay | Admitting: Physician Assistant

## 2018-05-01 ENCOUNTER — Other Ambulatory Visit: Payer: Self-pay | Admitting: Physician Assistant

## 2018-05-01 NOTE — Telephone Encounter (Signed)
Called [facility] [place] and spoke to [place] and she stated that she thinks he was given a 90 day supply of the Olmesartan on 01/25/18 and picked up a refill in July. Olegario MessierKathy could not tell me for certain the correct amount of pills given to the patient. Olegario MessierKathy stated she would call the patient to verify which bottle the patient received and they will send a refill request if he only received 30 days instead of 90 days.

## 2018-05-01 NOTE — Telephone Encounter (Signed)
Copied from CRM [id number]. Topic: Quick Communication - Office Called Patient >> May 01, 2018 10:09 AM Ronita HippsFagge, Megan L, CMA wrote: Reason for CRM: Called patient to see if he is taking his Olmesartan Once daily as the instructions state. Please ask patient if he is taking this one time daily. OK for [facility] to discuss and let us know. Thank you!

## 2018-05-01 NOTE — Telephone Encounter (Signed)
He is taking it once a day. The last re-fill he got was for 30 days. He has 3 pills left. Please advise.

## 2018-05-01 NOTE — Telephone Encounter (Signed)
Patient called, left VM to return call to the office to discuss Olmesartan.

## 2018-07-09 ENCOUNTER — Telehealth: Payer: Self-pay | Admitting: *Deleted

## 2018-07-09 DIAGNOSIS — Z8371 Family history of colonic polyps: Secondary | ICD-10-CM

## 2018-07-09 DIAGNOSIS — Z83719 Family history of colon polyps, unspecified: Secondary | ICD-10-CM

## 2018-07-09 NOTE — Telephone Encounter (Signed)
Ok to place referral with Dr. Marina Goodell. Would be up to GI office if they can schedule him before requested date or not.

## 2018-07-09 NOTE — Telephone Encounter (Signed)
Copied from CRM [id number]. Topic: General - Other >> Jul 09, 2018 11:42 AM Elliot Gault wrote: Jethro Bolus name: Chase Mcclure, Chase Mcclure Relation to pt: spouse  Call back number: [phone]  Reason for call:  Patient would colonoscopy before physical scheduled for 08/12/18 , please place orders with perry,john Jarvis Newcomer.

## 2018-07-09 NOTE — Addendum Note (Signed)
Addended by: Lenis Dickinson on: 07/09/2018 02:19 PM   Modules accepted: Orders

## 2018-07-09 NOTE — Addendum Note (Signed)
Addended by: Lenis Dickinson on: 07/09/2018 01:44 PM   Modules accepted: Orders

## 2018-07-09 NOTE — Telephone Encounter (Signed)
Referral has been placed. 

## 2018-07-22 ENCOUNTER — Telehealth: Payer: Self-pay | Admitting: Physician Assistant

## 2018-07-22 NOTE — Telephone Encounter (Signed)
Please advise if you know if anything could be done on your end to get a sooner appt.   Copied from CRM [phone]. Topic: Referral - Status >> Jul 19, 2018 11:06 AM Windy KalataMichael, Taylor L, NT wrote: Reason for CRM: patient wife is calling and states that Dr. Marina GoodellPerry can not see him before the end of the year for his colonoscopy. He is wanting it done before January. They would like to know if Malva CoganCody Martin can push for this to be done sooner or if he can see another provider within that office to have his colonoscopy done. Please advise.

## 2018-07-22 NOTE — Telephone Encounter (Signed)
There is nothing I am able to do. We can try another provider or practice but other than that we have no control over their scheduling.

## 2018-08-11 NOTE — Progress Notes (Signed)
Patient presents to clinic today for annual exam.  Patient is fasting for labs.  Diet -- Endorses well-balanced diet overall.  Exercise -- Has decreased in amount since time change. Notes it is dark when he goes to work and by the time he gets home. Does try to exercise on weekends.   Chronic Issues: Hypertension -- Currently on a regimen of  Benicar. Endorses taking as directed. BP has been well-controlled on this regimen for some time. Has not taken this morning as he was not sure if he could take today as he is scheduled for colonoscopy tomorrow. Patient denies chest pain, palpitations, lightheadedness, dizziness, vision changes or frequent headaches.  BP Readings from Last 3 Encounters:  08/12/18 (!) 144/98  02/19/18 104/70  12/31/17 110/78   Anxiety -- Currently on a regimen of Fluoxetine 40 mg daily. Endorses taking as directed. Notes mood is doing very well. Denies change to eating habits or sleep. Denies SI/HI.  Health Maintenance: Immunizations -- Tetanus up-to-date. Flu shot due. Agrees to get today.  Past Medical History:  Diagnosis Date  . Anxiety   . Celiac disease   . Diverticulitis    per PET scan asymptomatic  . Hypertension    diet and exercise controlled.  Also has white coat syndrome.  Marland Kitchen. Shortness of breath dyspnea    recently    Past Surgical History:  Procedure Laterality Date  . COLONOSCOPY    . HERNIA REPAIR Left 2000ish  . HERNIA REPAIR Left 2015  . INGUINAL HERNIA REPAIR Right 04/30/2014   Procedure: LAPAROSCOPIC RIGHT  INGUINAL HERNIA REPAIR ;  Surgeon: Shelly Rubensteinouglas A Blackman, MD;  Location: [facility];  Service: General;  Laterality: Right;  . INSERTION OF MESH N/A 04/30/2014   Procedure: INSERTION OF MESH;  Surgeon: Shelly Rubensteinouglas A Blackman, MD;  Location: [facility];  Service: General;  Laterality: N/A;  . MEDIASTINOSCOPY N/A 12/29/2014   Procedure: MEDIASTINOSCOPY;  Surgeon: Kerin PernaPeter Van Trigt, MD;  [facility];  Service: Thoracic;  Laterality: N/A;  .  RETINOPATHY SURGERY     Serous  . trauma surgery      punctured lung lacerated spleen   . VASECTOMY    . VIDEO BRONCHOSCOPY WITH ENDOBRONCHIAL ULTRASOUND N/A 12/29/2014   Procedure: VIDEO BRONCHOSCOPY WITH ENDOBRONCHIAL ULTRASOUND;  Surgeon: Kerin PernaPeter Van Trigt, MD;  [facility];  Service: Thoracic;  Laterality: N/A;    Current Outpatient Medications on File Prior to Visit  Medication Sig Dispense Refill  . FLUoxetine (PROZAC) 40 MG capsule Take 1 capsule (40 mg total) by mouth daily. 90 capsule 3  . montelukast (SINGULAIR) 10 MG tablet Take 10 mg by mouth at bedtime.    Marland Kitchen. olmesartan (BENICAR) 20 MG tablet TAKE 1 TABLET BY MOUTH EVERY DAY 90 tablet 1  . vitamin B-12 (CYANOCOBALAMIN) 1000 MCG tablet Take 1,000 mcg by mouth daily.    . Vitamin D, Ergocalciferol, 2000 units CAPS Take 2 capsules by mouth daily. 30 capsule 0   No current facility-administered medications on file prior to visit.     Allergies  Allergen Reactions  . Gluten Meal Other (See Comments)    Celiac disease    Family History  Problem Relation Age of Onset  . Aneurysm Mother        brain  . Drug abuse Mother   . Rheum arthritis Father   . Prostate cancer Father [id number]  . Stroke Father [id number]       preceded bymultiple TIAs  . Hypertension Father   .  Hearing loss Father   . Dementia Father   . Colon polyps Father        late 33s.  . Cancer Maternal Aunt        x2  . Hearing loss Brother   . Hypertension Brother   . Diabetes Daughter     Social History   Socioeconomic History  . Marital status: Married    Spouse name: Not on file  . Number of children: 3  . Years of education: Not on file  . Highest education level: Not on file  Occupational History  . Occupation: Government social research officer  Social Needs  . Financial resource strain: Not on file  . Food insecurity:    Worry: Not on file    Inability: Not on file  . Transportation needs:    Medical: Not on file    Non-medical: Not on file  Tobacco Use  .  Smoking status: Former Smoker    Packs/day: 0.25    Years: 1.00    Pack years: 0.25    Types: Cigarettes    Last attempt to quit: 09/04/1988    Years since quitting: 29.9  . Smokeless tobacco: Former Neurosurgeon    Types: Chew    Quit date: 09/04/1998  Substance and Sexual Activity  . Alcohol use: Yes    Alcohol/week: 2.0 standard drinks    Types: 2 Glasses of wine per week    Comment: once a month  . Drug use: No  . Sexual activity: Yes  Lifestyle  . Physical activity:    Days per week: Not on file    Minutes per session: Not on file  . Stress: Not on file  Relationships  . Social connections:    Talks on phone: Not on file    Gets together: Not on file    Attends religious service: Not on file    Active member of club or organization: Not on file    Attends meetings of clubs or organizations: Not on file    Relationship status: Not on file  . Intimate partner violence:    Fear of current or ex partner: Not on file    Emotionally abused: Not on file    Physically abused: Not on file    Forced sexual activity: Not on file  Other Topics Concern  . Not on file  Social History Narrative   Very active job   Review of Systems  Constitutional: Negative for fever and weight loss.  HENT: Negative for ear discharge, ear pain, hearing loss and tinnitus.   Eyes: Negative for blurred vision, double vision, photophobia and pain.  Respiratory: Negative for cough and shortness of breath.   Cardiovascular: Negative for chest pain and palpitations.  Gastrointestinal: Negative for abdominal pain, blood in stool, constipation, diarrhea, heartburn, melena, nausea and vomiting.  Genitourinary: Negative for dysuria, flank pain, frequency, hematuria and urgency.       Nocturia x 0-1  Musculoskeletal: Negative for falls.  Neurological: Negative for dizziness, loss of consciousness and headaches.  Endo/Heme/Allergies: Negative for environmental allergies.  Psychiatric/Behavioral: Negative for  depression, hallucinations, substance abuse and suicidal ideas. The patient is not nervous/anxious and does not have insomnia.    BP (!) 144/98   Pulse 72   Temp 98 F (36.7 C) (Oral)   Resp 16   Ht 6' (1.829 m)   Wt 227 lb (103 kg)   SpO2 98%   BMI 30.79 kg/m   Physical Exam  Constitutional: He is oriented to  person, place, and time. He appears well-developed and well-nourished. No distress.  HENT:  Head: Normocephalic and atraumatic.  Right Ear: Tympanic membrane, external ear and ear canal normal.  Left Ear: Tympanic membrane, external ear and ear canal normal.  Nose: Nose normal.  Mouth/Throat: Oropharynx is clear and moist and mucous membranes are normal. No posterior oropharyngeal edema or posterior oropharyngeal erythema.  Eyes: Pupils are equal, round, and reactive to light. Conjunctivae are normal.  Neck: Neck supple. No thyromegaly present.  Cardiovascular: Normal rate, regular rhythm, normal heart sounds and intact distal pulses.  Pulmonary/Chest: Effort normal and breath sounds normal. No respiratory distress. He has no wheezes. He has no rales. He exhibits no tenderness.  Abdominal: Soft. Bowel sounds are normal. He exhibits no distension and no mass. There is no tenderness. There is no rebound and no guarding.  Lymphadenopathy:    He has no cervical adenopathy.  Neurological: He is alert and oriented to person, place, and time. No cranial nerve deficit.  Skin: Skin is warm and dry. No rash noted. He is not diaphoretic.  Psychiatric: He has a normal mood and affect.  Vitals reviewed.  Assessment/Plan: Essential hypertension Normally very well controlled with current regimen. Has not taken medication yet. Is encouraged to take as soon as he gets home this morning. Continue DASH diet. Repeat labs today. Has colonoscopy scheduled for tomorrow -- will see what BP is then.   Need for immunization against influenza Flu shot given today by CMA.  Prostate cancer  screening The natural history of prostate cancer and ongoing controversy regarding screening and potential treatment outcomes of prostate cancer has been discussed with the patient. The meaning of a false positive PSA and a false negative PSA has been discussed. He indicates understanding of the limitations of this screening test and wishes to proceed with screening PSA testing.   Visit for preventive health examination Depression screen negative. Health Maintenance reviewed. Preventive schedule discussed and handout given in AVS. Will obtain fasting labs today.     Piedad Climes, PA-C

## 2018-08-12 ENCOUNTER — Other Ambulatory Visit: Payer: Self-pay

## 2018-08-12 ENCOUNTER — Encounter: Payer: Self-pay | Admitting: Physician Assistant

## 2018-08-12 ENCOUNTER — Ambulatory Visit (INDEPENDENT_AMBULATORY_CARE_PROVIDER_SITE_OTHER): Payer: PRIVATE HEALTH INSURANCE | Admitting: Physician Assistant

## 2018-08-12 VITALS — BP 136/98 | HR 72 | Temp 98.0°F | Resp 16 | Ht 72.0 in | Wt 227.0 lb

## 2018-08-12 DIAGNOSIS — Z0000 Encounter for general adult medical examination without abnormal findings: Secondary | ICD-10-CM | POA: Insufficient documentation

## 2018-08-12 DIAGNOSIS — I10 Essential (primary) hypertension: Secondary | ICD-10-CM

## 2018-08-12 DIAGNOSIS — Z125 Encounter for screening for malignant neoplasm of prostate: Secondary | ICD-10-CM

## 2018-08-12 DIAGNOSIS — Z23 Encounter for immunization: Secondary | ICD-10-CM | POA: Diagnosis not present

## 2018-08-12 LAB — COMPREHENSIVE METABOLIC PANEL
ALBUMIN: 4.4 g/dL (ref 3.5–5.2)
ALK PHOS: 64 U/L (ref 39–117)
ALT: 29 U/L (ref 0–53)
AST: 20 U/L (ref 0–37)
BILIRUBIN TOTAL: 0.5 mg/dL (ref 0.2–1.2)
BUN: 10 mg/dL (ref 6–23)
CALCIUM: 9.4 mg/dL (ref 8.4–10.5)
CO2: 25 mEq/L (ref 19–32)
Chloride: 104 mEq/L (ref 96–112)
Creatinine, Ser: 0.79 mg/dL (ref 0.40–1.50)
GFR: 110.41 mL/min (ref >60.00)
GLUCOSE: 103 mg/dL — AB (ref 70–99)
Potassium: 4.2 mEq/L (ref 3.5–5.1)
Sodium: 140 mEq/L (ref 135–145)
TOTAL PROTEIN: 6.8 g/dL (ref 6.0–8.3)

## 2018-08-12 LAB — LIPID PANEL
CHOLESTEROL: 198 mg/dL (ref 0–200)
HDL: 50.2 mg/dL (ref >39.00)
LDL Cholesterol: 131 mg/dL — ABNORMAL HIGH (ref 0–99)
NONHDL: 147.51
Total CHOL/HDL Ratio: 4
Triglycerides: 82 mg/dL (ref 0.0–149.0)
VLDL: 16.4 mg/dL (ref 0.0–40.0)

## 2018-08-12 LAB — CBC WITH DIFFERENTIAL/PLATELET
Basophils Absolute: 0 10*3/uL (ref 0.0–0.1)
Basophils Relative: 0.6 % (ref 0.0–3.0)
EOS PCT: 4 % (ref 0.0–5.0)
Eosinophils Absolute: 0.3 10*3/uL (ref 0.0–0.7)
HEMATOCRIT: 46 % (ref 39.0–52.0)
HEMOGLOBIN: 16.1 g/dL (ref 13.0–17.0)
LYMPHS PCT: 26.4 % (ref 12.0–46.0)
Lymphs Abs: 2 10*3/uL (ref 0.7–4.0)
MCHC: 35.1 g/dL (ref 30.0–36.0)
MCV: 95.7 fl (ref 78.0–100.0)
MONOS PCT: 8 % (ref 3.0–12.0)
Monocytes Absolute: 0.6 10*3/uL (ref 0.1–1.0)
NEUTROS PCT: 61 % (ref 43.0–77.0)
Neutro Abs: 4.6 10*3/uL (ref 1.4–7.7)
Platelets: 239 10*3/uL (ref 150.0–400.0)
RBC: 4.8 Mil/uL (ref 4.22–5.81)
RDW: 12.6 % (ref 11.5–15.5)
WBC: 7.5 10*3/uL (ref 4.0–10.5)

## 2018-08-12 LAB — HEMOGLOBIN A1C: Hgb A1c MFr Bld: 5.1 % (ref 4.6–6.5)

## 2018-08-12 LAB — PSA: PSA: 1.07 ng/mL (ref 0.10–4.00)

## 2018-08-12 NOTE — Assessment & Plan Note (Signed)
Normally very well controlled with current regimen. Has not taken medication yet. Is encouraged to take as soon as he gets home this morning. Continue DASH diet. Repeat labs today. Has colonoscopy scheduled for tomorrow -- will see what BP is then.

## 2018-08-12 NOTE — Assessment & Plan Note (Signed)
Depression screen negative. Health Maintenance reviewed. Preventive schedule discussed and handout given in AVS. Will obtain fasting labs today.  

## 2018-08-12 NOTE — Assessment & Plan Note (Signed)
Flu shot given today by CMA 

## 2018-08-12 NOTE — Patient Instructions (Addendum)
Please go to the lab for blood work.   Our office will call you with your results unless you have chosen to receive results via MyChart.  If your blood work is normal we will follow-up each year for physicals and as scheduled for chronic medical problems.  If anything is abnormal we will treat accordingly and get you in for a follow-up.   Preventive Care 40-64 Years, Male Preventive care refers to lifestyle choices and visits with your health care provider that can promote health and wellness. What does preventive care include?  A yearly physical exam. This is also called an annual well check.  Dental exams once or twice a year.  Routine eye exams. Ask your health care provider how often you should have your eyes checked.  Personal lifestyle choices, including: ? Daily care of your teeth and gums. ? Regular physical activity. ? Eating a healthy diet. ? Avoiding tobacco and drug use. ? Limiting alcohol use. ? Practicing safe sex. ? Taking low-dose aspirin every day starting at age 49. What happens during an annual well check? The services and screenings done by your health care provider during your annual well check will depend on your age, overall health, lifestyle risk factors, and family history of disease. Counseling Your health care provider may ask you questions about your:  Alcohol use.  Tobacco use.  Drug use.  Emotional well-being.  Home and relationship well-being.  Sexual activity.  Eating habits.  Work and work environment.  Screening You may have the following tests or measurements:  Height, weight, and BMI.  Blood pressure.  Lipid and cholesterol levels. These may be checked every 5 years, or more frequently if you are over 49 years old.  Skin check.  Lung cancer screening. You may have this screening every year starting at age 49 if you have a 30-pack-year history of smoking and currently smoke or have quit within the past 15 years.  Fecal  occult blood test (FOBT) of the stool. You may have this test every year starting at age 49.  Flexible sigmoidoscopy or colonoscopy. You may have a sigmoidoscopy every 5 years or a colonoscopy every 10 years starting at age 49.  Prostate cancer screening. Recommendations will vary depending on your family history and other risks.  Hepatitis C blood test.  Hepatitis B blood test.  Sexually transmitted disease (STD) testing.  Diabetes screening. This is done by checking your blood sugar (glucose) after you have not eaten for a while (fasting). You may have this done every 1-3 years.  Discuss your test results, treatment options, and if necessary, the need for more tests with your health care provider. Vaccines Your health care provider may recommend certain vaccines, such as:  Influenza vaccine. This is recommended every year.  Tetanus, diphtheria, and acellular pertussis (Tdap, Td) vaccine. You may need a Td booster every 10 years.  Varicella vaccine. You may need this if you have not been vaccinated.  Zoster vaccine. You may need this after age 49.  Measles, mumps, and rubella (MMR) vaccine. You may need at least one dose of MMR if you were born in 1957 or later. You may also need a second dose.  Pneumococcal 13-valent conjugate (PCV13) vaccine. You may need this if you have certain conditions and have not been vaccinated.  Pneumococcal polysaccharide (PPSV23) vaccine. You may need one or two doses if you smoke cigarettes or if you have certain conditions.  Meningococcal vaccine. You may need this if you have certain   A vaccine. You may need this if you have certain conditions or if you travel or work in places where you may be exposed to hepatitis A.  Hepatitis B vaccine. You may need this if you have certain conditions or if you travel or work in places where you may be exposed to hepatitis B.  Haemophilus influenzae type b (Hib) vaccine. You may need this if  you have certain risk factors.  Talk to your health care provider about which screenings and vaccines you need and how often you need them. This information is not intended to replace advice given to you by your health care provider. Make sure you discuss any questions you have with your health care provider. Document Released: 09/17/2015 Document Revised: 05/10/2016 Document Reviewed: 06/22/2015 Elsevier Interactive Patient Education  [employer]. .

## 2018-08-12 NOTE — Assessment & Plan Note (Signed)
The natural history of prostate cancer and ongoing controversy regarding screening and potential treatment outcomes of prostate cancer has been discussed with the patient. The meaning of a false positive PSA and a false negative PSA has been discussed. He indicates understanding of the limitations of this screening test and wishes  to proceed with screening PSA testing.  

## 2018-08-13 ENCOUNTER — Encounter: Payer: Self-pay | Admitting: Emergency Medicine

## 2018-08-13 LAB — HM COLONOSCOPY

## 2018-11-21 ENCOUNTER — Other Ambulatory Visit: Payer: Self-pay | Admitting: Physician Assistant

## 2019-02-15 ENCOUNTER — Other Ambulatory Visit: Payer: Self-pay | Admitting: Physician Assistant

## 2019-02-17 ENCOUNTER — Encounter: Payer: Self-pay | Admitting: Emergency Medicine

## 2019-02-17 NOTE — Telephone Encounter (Signed)
My chart message sent to patient to schedule a follow up appointment 

## 2019-03-27 ENCOUNTER — Telehealth: Payer: Self-pay | Admitting: Internal Medicine

## 2019-03-27 NOTE — Telephone Encounter (Signed)
Faxed medical records for last 5 years to [facility] @ [phone]

## 2019-04-01 ENCOUNTER — Other Ambulatory Visit: Payer: Self-pay

## 2019-04-01 ENCOUNTER — Ambulatory Visit (INDEPENDENT_AMBULATORY_CARE_PROVIDER_SITE_OTHER): Payer: PRIVATE HEALTH INSURANCE

## 2019-04-01 ENCOUNTER — Encounter: Payer: Self-pay | Admitting: Orthopaedic Surgery

## 2019-04-01 ENCOUNTER — Ambulatory Visit (INDEPENDENT_AMBULATORY_CARE_PROVIDER_SITE_OTHER): Payer: PRIVATE HEALTH INSURANCE | Admitting: Orthopaedic Surgery

## 2019-04-01 VITALS — Ht 73.0 in | Wt 220.0 lb

## 2019-04-01 DIAGNOSIS — M25561 Pain in right knee: Secondary | ICD-10-CM

## 2019-04-01 MED ORDER — METHYLPREDNISOLONE ACETATE 40 MG/ML IJ SUSP
40.0000 mg | INTRAMUSCULAR | Status: AC | PRN
  Administered 2019-04-01: 40 mg via INTRA_ARTICULAR

## 2019-04-01 MED ORDER — BUPIVACAINE HCL 0.25 % IJ SOLN
4.0000 mL | INTRAMUSCULAR | Status: AC | PRN
  Administered 2019-04-01: 4 mL via INTRA_ARTICULAR

## 2019-04-01 MED ORDER — LIDOCAINE HCL 1 % IJ SOLN
0.5000 mL | INTRAMUSCULAR | Status: AC | PRN
  Administered 2019-04-01: .5 mL

## 2019-04-01 NOTE — Progress Notes (Addendum)
Office Visit Note   Patient: Chase CedarsJoseph L Grullon           Date of Birth: [birth year]           MRN: [id number] Visit Date: 04/01/2019              Requested by: Waldon MerlMartin, William C, PA-C [address] [place],  [place] [id number] PCP: Waldon MerlMartin, William C, PA-C   Assessment & Plan: Visit Diagnoses:  1. Acute pain of right knee     Plan: We discussed x-ray findings with some chondrocalcinosis he may possibly have a medial meniscal tear.  We will try a intra-articular injection with cortisone and see if this settles down his symptoms.  I plan to recheck him in 1 month.  Postinjection he was still having pain and still walking with a limp but did have some improvement in his symptoms.  If he still having sharp pain when he first gets up and continue trouble walking within few weeks he will call us.  Follow-Up Instructions: No follow-ups on file.   Orders:  Orders Placed This Encounter  Procedures  . Large Joint Inj  . XR KNEE 3 VIEW RIGHT   No orders of the defined types were placed in this encounter.     Procedures: Large Joint Inj: R knee on 04/01/2019 1:03 PM Indications: pain and joint swelling Details: 22 G 1.5 in needle, anterolateral approach  Arthrogram: No  Medications: 40 mg methylPREDNISolone acetate 40 MG/ML; 0.5 mL lidocaine 1 %; 4 mL bupivacaine 0.25 % Outcome: tolerated well, no immediate complications Procedure, treatment alternatives, risks and benefits explained, specific risks discussed. Consent was given by the patient. Immediately prior to procedure a time out was called to verify the correct patient, procedure, equipment, support staff and site/side marked as required. Patient was prepped and draped in the usual sterile fashion.       Clinical Data: No additional findings.   Subjective: Chief Complaint  Patient presents with  . Right Knee - Pain    HPI 48 year old male with a greater than 403-month history of right knee pain.  At times when he stands  up he cannot put weight on it for almost a minute.  It is worse when he is going from sitting to standing.  He has had no giving way or locking.  He has noted slight swelling after he does yard work.  He is not fallen.  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  He is used occasional ibuprofen 2 tablets and has been using repetitive icing of the medial joint line right knee.  Review of Systems positive for hearing loss celiac disease, pulmonary sarcoidosis.  Positive for anxiety.  Otherwise 14 point review of systems negative as pertains HPI.   Objective: Vital Signs: Ht 6\' 1"  (1.854 m)   Wt 220 lb (99.8 kg)   BMI 29.03 kg/m   Physical Exam Constitutional:      Appearance: He is well-developed.  HENT:     Head: Normocephalic and atraumatic.  Eyes:     Pupils: Pupils are equal, round, and reactive to light.  Neck:     Thyroid: No thyromegaly.     Trachea: No tracheal deviation.  Cardiovascular:     Rate and Rhythm: Normal rate.  Pulmonary:     Effort: Pulmonary effort is normal.     Breath sounds: No wheezing.  Abdominal:     General: Bowel sounds are normal.  Palpations: Abdomen is soft.  Skin:    General: Skin is warm and dry.     Capillary Refill: Capillary refill takes less than 2 seconds.  Neurological:     Mental Status: He is alert and oriented to person, place, and time.  Psychiatric:        Behavior: Behavior normal.        Thought Content: Thought content normal.        Judgment: Judgment normal.     Ortho Exam no pain with hyperextension he can flex past 125 degrees without pain.  Normal patellar tracking.  He has medial joint line tenderness that extends behind the medial collateral ligament.  Medial lateral collaterals are normal ACL PCL exam is normal.  No lateral joint line tenderness.  Pes bursa hamstrings are normal no palpable popliteal cyst.  Specialty Comments:  No specialty comments available.  Imaging: Xr Knee 3  View Right  Result Date: 04/01/2019 Standing AP both knees lateral right knee and sunrise patellar x-ray obtained and reviewed.  This shows some chondrocalcinosis of the medial meniscus of the right knee.  Minimal knee joint narrowing.  Negative for acute changes normal bony architecture. Impression: Negative for acute changes.  Chondrocalcinosis noted right knee medial meniscus.    PMFS History: Patient Active Problem List   Diagnosis Date Noted  . Visit for preventive health examination 08/12/2018  . Prostate cancer screening 08/12/2018  . Need for immunization against influenza 08/12/2018  . Right chronic serous otitis media 11/20/2017  . Testosterone insufficiency 10/23/2017  . Need for Tdap vaccination 10/23/2017  . Essential hypertension 05/12/2017  . Bilateral hearing loss 05/12/2017  . Eustachian tube dysfunction, bilateral 05/12/2017  . Celiac disease 05/12/2017  . Family history of colonic polyps 05/12/2017  . GAD (generalized anxiety disorder) 05/12/2017  . Pulmonary sarcoidosis (HCC) 12/16/2014  . Right inguinal hernia 04/20/2014   Past Medical History:  Diagnosis Date  . Anxiety   . Celiac disease   . Diverticulitis    per PET scan asymptomatic  . Hypertension    diet and exercise controlled.  Also has white coat syndrome.  Marland Kitchen. Shortness of breath dyspnea    recently    Family History  Problem Relation Age of Onset  . Aneurysm Mother        brain  . Drug abuse Mother   . Rheum arthritis Father   . Prostate cancer Father [id number]  . Stroke Father [phone]       preceded bymultiple TIAs  . Hypertension Father   . Hearing loss Father   . Dementia Father   . Colon polyps Father        late 1260s.  . Cancer Maternal Aunt        x2  . Hearing loss Brother   . Hypertension Brother   . Diabetes Daughter     Past Surgical History:  Procedure Laterality Date  . COLONOSCOPY    . HERNIA REPAIR Left 2000ish  . HERNIA REPAIR Left 2015  . INGUINAL HERNIA REPAIR Right  04/30/2014   Procedure: LAPAROSCOPIC RIGHT  INGUINAL HERNIA REPAIR ;  Surgeon: Shelly Rubensteinouglas A Blackman, MD;  Location: [facility];  Service: General;  Laterality: Right;  . INSERTION OF MESH N/A 04/30/2014   Procedure: INSERTION OF MESH;  Surgeon: Shelly Rubensteinouglas A Blackman, MD;  Location: [facility];  Service: General;  Laterality: N/A;  . MEDIASTINOSCOPY N/A 12/29/2014   Procedure: MEDIASTINOSCOPY;  Surgeon: Kerin PernaPeter Van Trigt, MD;  [facility];  Service: Thoracic;  Laterality: N/A;  . RETINOPATHY SURGERY     Serous  . trauma surgery      punctured lung lacerated spleen   . VASECTOMY    . VIDEO BRONCHOSCOPY WITH ENDOBRONCHIAL ULTRASOUND N/A 12/29/2014   Procedure: VIDEO BRONCHOSCOPY WITH ENDOBRONCHIAL ULTRASOUND;  Surgeon: Ivin Poot, MD;  Location: [place];  Service: Thoracic;  Laterality: N/A;   Social History   Occupational History  . Occupation: Secretary/administrator  Tobacco Use  . Smoking status: Former Smoker    Packs/day: 0.25    Years: 1.00    Pack years: 0.25    Types: Cigarettes    Quit date: 09/04/1988    Years since quitting: 30.5  . Smokeless tobacco: Former Systems developer    Types: [place] date: 09/04/1998  Substance and Sexual Activity  . Alcohol use: Yes    Alcohol/week: 2.0 standard drinks    Types: 2 Glasses of wine per week    Comment: once a month  . Drug use: No  . Sexual activity: Yes

## 2019-05-13 ENCOUNTER — Other Ambulatory Visit: Payer: Self-pay | Admitting: Physician Assistant

## 2019-05-13 NOTE — Telephone Encounter (Signed)
Patient is due for a 6 month follow up.

## 2019-05-14 ENCOUNTER — Other Ambulatory Visit: Payer: Self-pay | Admitting: Physician Assistant

## 2019-05-24 ENCOUNTER — Emergency Department (HOSPITAL_COMMUNITY)
Admission: EM | Admit: 2019-05-24 | Discharge: 2019-05-25 | Disposition: A | Discharge status: Home / Self Care | Payer: PRIVATE HEALTH INSURANCE | Attending: Emergency Medicine | Admitting: Emergency Medicine

## 2019-05-24 ENCOUNTER — Other Ambulatory Visit: Payer: Self-pay

## 2019-05-24 ENCOUNTER — Encounter (HOSPITAL_COMMUNITY): Payer: Self-pay | Admitting: Emergency Medicine

## 2019-05-24 DIAGNOSIS — Z043 Encounter for examination and observation following other accident: Secondary | ICD-10-CM | POA: Insufficient documentation

## 2019-05-24 DIAGNOSIS — Z5321 Procedure and treatment not carried out due to patient leaving prior to being seen by health care provider: Secondary | ICD-10-CM | POA: Diagnosis not present

## 2019-05-24 NOTE — ED Notes (Addendum)
[phone]. Pt's wife wants to be notified when pt has a room.

## 2019-05-24 NOTE — ED Triage Notes (Signed)
Pt presents with laceration to right side of face after a fall tonight. Admits to ETOH, bleeding controlled at this time.

## 2019-05-25 ENCOUNTER — Encounter (HOSPITAL_COMMUNITY): Payer: Self-pay

## 2019-05-25 ENCOUNTER — Encounter: Payer: Self-pay | Admitting: Physician Assistant

## 2019-05-25 ENCOUNTER — Emergency Department (HOSPITAL_COMMUNITY)
Admission: EM | Admit: 2019-05-25 | Discharge: 2019-05-25 | Disposition: A | Discharge status: Home / Self Care | Payer: PRIVATE HEALTH INSURANCE | Source: Home / Self Care | Attending: Emergency Medicine | Admitting: Emergency Medicine

## 2019-05-25 DIAGNOSIS — S0181XA Laceration without foreign body of other part of head, initial encounter: Secondary | ICD-10-CM

## 2019-05-25 DIAGNOSIS — Y939 Activity, unspecified: Secondary | ICD-10-CM | POA: Insufficient documentation

## 2019-05-25 DIAGNOSIS — Y999 Unspecified external cause status: Secondary | ICD-10-CM | POA: Insufficient documentation

## 2019-05-25 DIAGNOSIS — W01118A Fall on same level from slipping, tripping and stumbling with subsequent striking against other sharp object, initial encounter: Secondary | ICD-10-CM | POA: Insufficient documentation

## 2019-05-25 DIAGNOSIS — Z79899 Other long term (current) drug therapy: Secondary | ICD-10-CM | POA: Insufficient documentation

## 2019-05-25 DIAGNOSIS — Z87891 Personal history of nicotine dependence: Secondary | ICD-10-CM | POA: Insufficient documentation

## 2019-05-25 DIAGNOSIS — I10 Essential (primary) hypertension: Secondary | ICD-10-CM | POA: Insufficient documentation

## 2019-05-25 DIAGNOSIS — Y929 Unspecified place or not applicable: Secondary | ICD-10-CM | POA: Insufficient documentation

## 2019-05-25 MED ORDER — LIDOCAINE-EPINEPHRINE (PF) 2 %-1:200000 IJ SOLN
10.0000 mL | Freq: Once | INTRAMUSCULAR | Status: AC
  Administered 2019-05-25: 10 mL
  Filled 2019-05-25: qty 10

## 2019-05-25 NOTE — ED Triage Notes (Signed)
Pt arrives with a laceration on the R side of his face. He states that he has a torn meniscus and has been experiencing vertigo this week, he tried to stand up really fast a fell in to some metal panels. Admits to some ETOH. A&Ox4. Bleeding controlled. No LOC. Does not take blood thinners.

## 2019-05-25 NOTE — ED Provider Notes (Signed)
[facility] DEPT Provider Note   CSN: [id number] Arrival date & time: 05/25/19  0135     History   Chief Complaint Chief Complaint  Patient presents with  . Laceration    HPI Chase Mcclure is a 48 y.o. male.     Patient to ED with facial laceration after fall around 10:30 pm tonight. He was walking on uneven ground and lost his balance. He reports having dizziness prior to his fall c/w ongoing vertigo that is not new this evening. He also reports recent meniscal knee injury that made him less able to stop himself from falling. No LOC, nausea. No neck pain, chest/abdominal injury, extremity injury.   The history is provided by the patient and the spouse. No language interpreter was used.  Laceration   Past Medical History:  Diagnosis Date  . Anxiety   . Celiac disease   . Diverticulitis    per PET scan asymptomatic  . Hypertension    diet and exercise controlled.  Also has white coat syndrome.  Marland Kitchen Shortness of breath dyspnea    recently    Patient Active Problem List   Diagnosis Date Noted  . Visit for preventive health examination 08/12/2018  . Prostate cancer screening 08/12/2018  . Need for immunization against influenza 08/12/2018  . Right chronic serous otitis media 11/20/2017  . Testosterone insufficiency 10/23/2017  . Need for Tdap vaccination 10/23/2017  . Essential hypertension 05/12/2017  . Bilateral hearing loss 05/12/2017  . Eustachian tube dysfunction, bilateral 05/12/2017  . Celiac disease 05/12/2017  . Family history of colonic polyps 05/12/2017  . GAD (generalized anxiety disorder) 05/12/2017  . Pulmonary sarcoidosis ([place]) 12/16/2014  . Right inguinal hernia 04/20/2014    Past Surgical History:  Procedure Laterality Date  . COLONOSCOPY    . HERNIA REPAIR Left 2000ish  . HERNIA REPAIR Left 2015  . INGUINAL HERNIA REPAIR Right 04/30/2014   Procedure: LAPAROSCOPIC RIGHT  INGUINAL HERNIA REPAIR ;  Surgeon: Harl Bowie, MD;  Location: [facility];  Service: General;  Laterality: Right;  . INSERTION OF MESH N/A 04/30/2014   Procedure: INSERTION OF MESH;  Surgeon: Harl Bowie, MD;  Location: [facility];  Service: General;  Laterality: N/A;  . MEDIASTINOSCOPY N/A 12/29/2014   Procedure: MEDIASTINOSCOPY;  Surgeon: Ivin Poot, MD;  Location: [facility];  Service: Thoracic;  Laterality: N/A;  . RETINOPATHY SURGERY     Serous  . trauma surgery      punctured lung lacerated spleen   . VASECTOMY    . VIDEO BRONCHOSCOPY WITH ENDOBRONCHIAL ULTRASOUND N/A 12/29/2014   Procedure: VIDEO BRONCHOSCOPY WITH ENDOBRONCHIAL ULTRASOUND;  Surgeon: Ivin Poot, MD;  Location: [facility];  Service: Thoracic;  Laterality: N/A;        Home Medications    Prior to Admission medications   Medication Sig Start Date End Date Taking? Authorizing Provider  FLUoxetine (PROZAC) 40 MG capsule TAKE 1 CAPSULE BY MOUTH EVERY DAY 02/17/19  Yes Brunetta Jeans, PA-C  olmesartan (BENICAR) 20 MG tablet Take 1 tablet (20 mg total) by mouth daily. Please schedule 6 month follow up with Elyn Aquas, PA for further refills [phone] 05/14/19  Yes Brunetta Jeans, PA-C  vitamin B-12 (CYANOCOBALAMIN) 1000 MCG tablet Take 1,000 mcg by mouth daily.   Yes [provider]  Vitamin D, Ergocalciferol, 2000 units CAPS Take 2 capsules by mouth daily. 10/25/17  Yes Brunetta Jeans, PA-C  montelukast (SINGULAIR) 10 MG tablet Take  10 mg by mouth at bedtime.    [provider]    Family History Family History  Problem Relation Age of Onset  . Aneurysm Mother        brain  . Drug abuse Mother   . Rheum arthritis Father   . Prostate cancer Father 13  . Stroke Father 73       preceded bymultiple TIAs  . Hypertension Father   . Hearing loss Father   . Dementia Father   . Colon polyps Father        late 9s.  . Cancer Maternal Aunt        x2  . Hearing loss Brother   . Hypertension Brother   . Diabetes Daughter      Social History Social History   Tobacco Use  . Smoking status: Former Smoker    Packs/day: 0.25    Years: 1.00    Pack years: 0.25    Types: Cigarettes    Quit date: 09/04/1988    Years since quitting: 30.7  . Smokeless tobacco: Former Neurosurgeon    Types: Chew    Quit date: 09/04/1998  Substance Use Topics  . Alcohol use: Yes    Alcohol/week: 2.0 standard drinks    Types: 2 Glasses of wine per week    Comment: once a month  . Drug use: No     Allergies   Gluten meal   Review of Systems Review of Systems  Constitutional: Negative for diaphoresis.  HENT: Positive for facial swelling (Laceration to right face.).   Eyes: Negative for pain and visual disturbance.  Cardiovascular: Negative for chest pain.  Gastrointestinal: Negative for abdominal pain.  Musculoskeletal: Negative for back pain and neck pain.  Skin: Positive for wound.  Neurological: Positive for dizziness.     Physical Exam Updated Vital Signs BP (!) 146/84 (BP Location: Left Arm)   Pulse 76   Temp 98.7 F (37.1 C) (Oral)   Resp 16   SpO2 99%   Physical Exam Vitals signs and nursing note reviewed.  Constitutional:      General: He is not in acute distress.    Appearance: He is not toxic-appearing.  HENT:     Right Ear: Tympanic membrane normal.     Left Ear: Tympanic membrane normal.     Ears:     Comments: No hemotympanum    Nose: Nose normal.  Eyes:     Extraocular Movements: Extraocular movements intact.     Conjunctiva/sclera: Conjunctivae normal.     Pupils: Pupils are equal, round, and reactive to light.     Comments: No pain with eye movement. Right eye lids without swelling or limitation to open/close.  Pulmonary:     Effort: Pulmonary effort is normal.  Chest:     Chest wall: No tenderness.  Abdominal:     Tenderness: There is no abdominal tenderness.  Musculoskeletal: Normal range of motion.     Comments: Facial movements intact. No asymmetry. No midline or paracervical tenderness.  Moves all extremities without limitation.  Skin:    Comments: 8 cm full thickness laceration to right lateral cheek extending from near the lateral canthus of eye to just above the mandible. Does not extend into the oral cavity.  Neurological:     Mental Status: He is alert and oriented to person, place, and time.     Sensory: No sensory deficit.     Comments: No facial sensory deficits.       ED  Treatments / Results  Labs (all labs ordered are listed, but only abnormal results are displayed) Labs Reviewed - No data to display  EKG None  Radiology No results found.  Procedures .Marland Kitchen.Laceration Repair  Date/Time: 05/25/2019 6:09 AM Performed by: Elpidio AnisUpstill, Thuy Atilano, PA-C Authorized by: Elpidio AnisUpstill, Trinh Sanjose, PA-C   Consent:    Consent obtained:  Verbal   Consent given by:  Patient Anesthesia (see MAR for exact dosages):    Anesthesia method:  Local infiltration   Local anesthetic:  Lidocaine 2% WITH epi Laceration details:    Location:  Face   Face location:  R cheek   Length (cm):  8 Repair type:    Repair type:  Intermediate Pre-procedure details:    Preparation:  Patient was prepped and draped in usual sterile fashion Exploration:    Hemostasis achieved with:  Direct pressure and epinephrine   Wound exploration: entire depth of wound probed and visualized     Wound extent: no foreign bodies/material noted, no muscle damage noted, no nerve damage noted, no tendon damage noted and no vascular damage noted     Contaminated: no   Treatment:    Area cleansed with:  Betadine and saline   Amount of cleaning:  Extensive   Irrigation solution:  Sterile saline   Irrigation method:  Syringe Subcutaneous repair:    Suture size:  6-0   Suture material:  Vicryl   Suture technique:  Simple interrupted   Number of sutures:  4 Skin repair:    Repair method:  Sutures   Suture size:  6-0   Suture material:  Prolene   Suture technique:  Simple interrupted   Number of sutures:  20  Approximation:    Approximation:  Close Post-procedure details:    Patient tolerance of procedure:  Tolerated well, no immediate complications   (including critical care time)  Medications Ordered in ED Medications  lidocaine-EPINEPHrine (XYLOCAINE W/EPI) 2 %-1:200000 (PF) injection 10 mL (has no administration in time range)     Initial Impression / Assessment and Plan / ED Course  I have reviewed the triage vital signs and the nursing notes.  Pertinent labs & imaging results that were available during my care of the patient were reviewed by me and considered in my medical decision making (see chart for details).        Patient to ED with significant facial laceration after fall tonight. He admits to having alcohol tonight but is not intoxicated, is coherent, and oriented. I feel he is a reliable historian.  There is no neurologic, muscular or vascular compromise associated with the deep facial laceration. Wound repaired as per above note.   He is felt appropriate for discharge home. Tetanus status is up to date.   Final Clinical Impressions(s) / ED Diagnoses   Final diagnoses:  None   1. Fall 2. Facial laceration  ED Discharge Orders    None       Elpidio AnisUpstill, Darryll Raju, PA-C 05/25/19 [id number]    Ward, Layla MawKristen N, DO 05/25/19 [phone]

## 2019-05-25 NOTE — Discharge Instructions (Addendum)
Sutures can be removed in 4-5 days. Go to be seen sooner with any sign of infection - increased pain or swelling, fever, drainage from the wound.

## 2019-05-25 NOTE — ED Notes (Signed)
Pt left for another facility.

## 2019-05-26 ENCOUNTER — Telehealth: Payer: Self-pay | Admitting: Orthopaedic Surgery

## 2019-05-26 NOTE — Telephone Encounter (Signed)
Patient can not come tomorrow. Would like to know if Wednesday would be ok to come. He gets his stiches out at 8am Wednesday. Wife would like for you to call her back. [phone]

## 2019-05-26 NOTE — Telephone Encounter (Signed)
Patient's wife called stating that she spoke to Dr. Lorin Mercy over the weekend and advised them to call the office and have you find a spot for him on Tuesday.  Patient fell over the weekend.  CV#[phone].  Thank you.

## 2019-05-26 NOTE — Telephone Encounter (Signed)
Please call patient to schedule appt for suture removal.

## 2019-05-26 NOTE — Telephone Encounter (Signed)
I left voicemail for patient's wife. Appt work in for 1045 in the am. I did advise they may have to wait.  Asked for return call if this appt will not work for them.

## 2019-05-26 NOTE — Telephone Encounter (Signed)
I called and left voicemail. Patient worked in to schedule on Wednesday.

## 2019-05-27 ENCOUNTER — Other Ambulatory Visit: Payer: Self-pay | Admitting: Physician Assistant

## 2019-05-27 ENCOUNTER — Ambulatory Visit: Payer: PRIVATE HEALTH INSURANCE | Admitting: Orthopaedic Surgery

## 2019-05-28 ENCOUNTER — Other Ambulatory Visit: Payer: Self-pay

## 2019-05-28 ENCOUNTER — Ambulatory Visit (INDEPENDENT_AMBULATORY_CARE_PROVIDER_SITE_OTHER): Payer: PRIVATE HEALTH INSURANCE | Admitting: Orthopaedic Surgery

## 2019-05-28 ENCOUNTER — Ambulatory Visit (INDEPENDENT_AMBULATORY_CARE_PROVIDER_SITE_OTHER): Payer: PRIVATE HEALTH INSURANCE | Admitting: Physician Assistant

## 2019-05-28 ENCOUNTER — Encounter: Payer: Self-pay | Admitting: Orthopaedic Surgery

## 2019-05-28 ENCOUNTER — Encounter: Payer: Self-pay | Admitting: Physician Assistant

## 2019-05-28 VITALS — BP 122/86 | HR 68 | Temp 99.0°F | Resp 16 | Ht 72.0 in | Wt 224.0 lb

## 2019-05-28 DIAGNOSIS — S0181XD Laceration without foreign body of other part of head, subsequent encounter: Secondary | ICD-10-CM

## 2019-05-28 DIAGNOSIS — M23321 Other meniscus derangements, posterior horn of medial meniscus, right knee: Secondary | ICD-10-CM | POA: Diagnosis not present

## 2019-05-28 DIAGNOSIS — Z23 Encounter for immunization: Secondary | ICD-10-CM

## 2019-05-28 DIAGNOSIS — H6983 Other specified disorders of Eustachian tube, bilateral: Secondary | ICD-10-CM

## 2019-05-28 DIAGNOSIS — H6503 Acute serous otitis media, bilateral: Secondary | ICD-10-CM | POA: Diagnosis not present

## 2019-05-28 DIAGNOSIS — H6993 Unspecified Eustachian tube disorder, bilateral: Secondary | ICD-10-CM

## 2019-05-28 NOTE — Progress Notes (Signed)
Patient presents to clinic today for suture removal. Patient presented to ER on 05/25/2019 with laceration to R-side of face after fall at home, hitting a piece of metal. TDAP UTD. 4 SQ sutures placed and 20 superficial simple uninterrupted sutures placed to close skin. Tolerated well. Was discharged home to follow-up with PCP in 4-5 days for suture removal. Denies any significant pain. Denies drainage from the area. Notes it is only day 3 since placement but he was concerned about skin growing over the sutures since it seemed it was healing well.  On further discussion regarding patient fall, notes he was having some dizziness prior to fall related to nasal congestion and bilateral ear pressure and popping which is an ongoing issue for him despite use of Zyrtec.  Denies chest pain, palpitations, lightheadedness or SOB.   Patient would like flu shot today.  Past Medical History:  Diagnosis Date  . Anxiety   . Celiac disease   . Diverticulitis    per PET scan asymptomatic  . Hypertension    diet and exercise controlled.  Also has white coat syndrome.  Marland Kitchen Shortness of breath dyspnea    recently    Current Outpatient Medications on File Prior to Visit  Medication Sig Dispense Refill  . FLUoxetine (PROZAC) 40 MG capsule TAKE 1 CAPSULE BY MOUTH EVERY DAY 90 capsule 0  . montelukast (SINGULAIR) 10 MG tablet Take 10 mg by mouth at bedtime.    Marland Kitchen olmesartan (BENICAR) 20 MG tablet Take 1 tablet (20 mg total) by mouth daily. Please schedule 6 month follow up with Elyn Aquas, PA for further refills [phone] 30 tablet 0  . Turmeric 400 MG CAPS Take 1 capsule by mouth daily.    . vitamin B-12 (CYANOCOBALAMIN) 1000 MCG tablet Take 1,000 mcg by mouth daily.    . Vitamin D, Ergocalciferol, 2000 units CAPS Take 2 capsules by mouth daily. 30 capsule 0   No current facility-administered medications on file prior to visit.     Allergies  Allergen Reactions  . Gluten Meal Other (See Comments)   Celiac disease    Family History  Problem Relation Age of Onset  . Aneurysm Mother        brain  . Drug abuse Mother   . Rheum arthritis Father   . Prostate cancer Father 69  . Stroke Father 40       preceded bymultiple TIAs  . Hypertension Father   . Hearing loss Father   . Dementia Father   . Colon polyps Father        late 31s.  . Cancer Maternal Aunt        x2  . Hearing loss Brother   . Hypertension Brother   . Diabetes Daughter     Social History   Socioeconomic History  . Marital status: Married    Spouse name: Not on file  . Number of children: 3  . Years of education: Not on file  . Highest education level: Not on file  Occupational History  . Occupation: Secretary/administrator  Social Needs  . Financial resource strain: Not on file  . Food insecurity    Worry: Not on file    Inability: Not on file  . Transportation needs    Medical: Not on file    Non-medical: Not on file  Tobacco Use  . Smoking status: Former Smoker    Packs/day: 0.25    Years: 1.00    Pack years: 0.25  Types: Cigarettes    Quit date: 09/04/1988    Years since quitting: 30.7  . Smokeless tobacco: Former Neurosurgeon    Types: Chew    Quit date: 09/04/1998  Substance and Sexual Activity  . Alcohol use: Yes    Alcohol/week: 2.0 standard drinks    Types: 2 Glasses of wine per week    Comment: once a month  . Drug use: No  . Sexual activity: Yes  Lifestyle  . Physical activity    Days per week: Not on file    Minutes per session: Not on file  . Stress: Not on file  Relationships  . Social Musician on phone: Not on file    Gets together: Not on file    Attends religious service: Not on file    Active member of club or organization: Not on file    Attends meetings of clubs or organizations: Not on file    Relationship status: Not on file  Other Topics Concern  . Not on file  Social History Narrative   Very active job   Review of Systems - See HPI.  All other ROS are  negative.  BP 122/86   Pulse 68   Temp 99 F (37.2 C) (Skin)   Resp 16   Ht 6' (1.829 m)   Wt 224 lb (101.6 kg)   SpO2 98%   BMI 30.38 kg/m   Physical Exam Vitals signs reviewed.  Constitutional:      Appearance: Normal appearance.  HENT:     Head: Normocephalic.      Right Ear: Ear canal and external ear normal. A middle ear effusion (serous) is present. There is no impacted cerumen.     Left Ear: Ear canal and external ear normal. A middle ear effusion (serous) is present. There is no impacted cerumen. Tympanic membrane is retracted.     Nose: Nose normal.     Mouth/Throat:     Mouth: Mucous membranes are moist.  Eyes:     Conjunctiva/sclera: Conjunctivae normal.     Pupils: Pupils are equal, round, and reactive to light.  Neck:     Musculoskeletal: Neck supple.  Cardiovascular:     Rate and Rhythm: Normal rate and regular rhythm.     Pulses: Normal pulses.     Heart sounds: Normal heart sounds.  Pulmonary:     Effort: Pulmonary effort is normal.     Breath sounds: Normal breath sounds.  Neurological:     Mental Status: He is alert.  Psychiatric:        Mood and Affect: Mood normal.    Assessment/Plan: 1. Facial laceration, subsequent encounter Routine healing. No sign of infection on examination today. Tetanus UTD. Discussed typically want 4-5 days of healing before suture removal but looks well healed today. Had supervising MD Dr. Neena Rhymes also assess and agrees removal appropriate today. 20 simple, uninterrupted sutures removed without issue. Patient tolerated very well. Steri strip applied to healing laceration just to give extra support for the next day. Hygiene reviewed. Return precautions reviewed.  2. Dysfunction of both eustachian tubes 3. Bilateral acute serous otitis media, recurrence not specified Switch from Zyrtec to Allegra-D or Claritin-D. Continue Flonase. Follow-up discussed. If not resolving will attempt prednisone taper but may need ENT  assessment.   4. Need for immunization against influenza - Flu Vaccine QUAD 36+ mos IM   Piedad Climes, PA-C

## 2019-05-28 NOTE — Patient Instructions (Addendum)
Please keep skin clean and dry. Wash with warm soapy water but dont be rough on the area. Pat dry well. Watch for any redness, pain or drainage that would indicate infection.  Please switch the Zyrtec to a Claritin-D or Allegra-D over the counter for the next 7-10 days. Continue the Flonase daily as directed. If not improving we will need to consider a small course of steroid or having an ENT take a look giving this ongoing issue.  Marland Kitchen

## 2019-05-28 NOTE — Progress Notes (Signed)
Office Visit Note   Patient: Chase Mcclure           Date of Birth: 22-May-1971           MRN: [id number] Visit Date: 05/28/2019              Requested by: Waldon Merl, PA-C [id number] [place],  [place] [address] PCP: Waldon Merl, PA-C   Assessment & Plan: Visit Diagnoses:  1. Other meniscus derangements, posterior horn of medial meniscus, right knee     Plan: Patient had persistent knee symptoms failed anti-inflammatories intra-articular cortisone injection.  X-rays demonstrate chondrocalcinosis of the medial knee meniscus and he has had locking intermittently and now falling episodes to his knee giving way with facial lacerations requiring emergent treatment.  I recommend proceeding with an MRI scan for his right knee with repetitive catching.  He may have medial meniscal tear or medial chondral flap tear which is symptomatic.  Office follow-up after MRI scan for review.  Follow-Up Instructions: No follow-ups on file.   Orders:  No orders of the defined types were placed in this encounter.  No orders of the defined types were placed in this encounter.     Procedures: No procedures performed   Clinical Data: No additional findings.   Subjective: Chief Complaint  Patient presents with  . Right Knee - Pain    Fall 05/25/2019    HPI 48 year old male returns with ongoing problems with right knee pain.  He has had symptoms for 2 months with acute exacerbation and states his knee is grabbed caught on them and he fell on 05/25/2019.  Previous cortisone injection gave him some short-term relief.  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  He states he got up from a low seat when he stood up his right knee buckled gave way and when he fell he had his face on something sharp metal and had to have 20 stitches in the emergency room.  Facial laceration eyebrow fortunately no injury to  his eye.  Patient's been using a brace on his knee his knee still feels unstable with medial joint line pain.  His knee catches sometimes when he sitting and tries to stand up.  Review of Systems positive for hearing loss, celiac disease pulmonary sarcoidosis, anxiety.  Recent facial laceration with fall from locking of his right knee.  Otherwise negative is it pertains HPI.   Objective: Vital Signs: BP (!) 141/94   Pulse 66   Ht 6' (1.829 m)   Wt 216 lb (98 kg)   BMI 29.29 kg/m   Physical Exam Constitutional:      Appearance: He is well-developed.  HENT:     Head: Normocephalic.     Comments: Steri-Strips over right temporal eyebrow and right cheek laceration after suture removal. Eyes:     Pupils: Pupils are equal, round, and reactive to light.  Neck:     Thyroid: No thyromegaly.     Trachea: No tracheal deviation.  Cardiovascular:     Rate and Rhythm: Normal rate.  Pulmonary:     Effort: Pulmonary effort is normal.     Breath sounds: No wheezing.  Abdominal:     General: Bowel sounds are normal.     Palpations: Abdomen is soft.  Skin:    General: Skin is warm and dry.  Capillary Refill: Capillary refill takes less than 2 seconds.  Neurological:     Mental Status: He is alert and oriented to person, place, and time.  Psychiatric:        Behavior: Behavior normal.        Thought Content: Thought content normal.        Judgment: Judgment normal.     Ortho Exam patient has 2+ knee effusion.  No valgus opening with medial collateral stress.  ACL PCL Lockman test is normal.  Station his foot is intact no ecchymosis.  He has medial joint line tenderness with palpation pain with hyperextension at the medial joint line no lateral joint line tenderness.  PCL testing is normal.  Pes bursa is normal.  Specialty Comments:  No specialty comments available.  Imaging: No results found.   PMFS History: Patient Active Problem List   Diagnosis Date Noted  . Other meniscus  derangements, posterior horn of medial meniscus, right knee 05/28/2019  . Visit for preventive health examination 08/12/2018  . Prostate cancer screening 08/12/2018  . Need for immunization against influenza 08/12/2018  . Right chronic serous otitis media 11/20/2017  . Testosterone insufficiency 10/23/2017  . Need for Tdap vaccination 10/23/2017  . Essential hypertension 05/12/2017  . Bilateral hearing loss 05/12/2017  . Eustachian tube dysfunction, bilateral 05/12/2017  . Celiac disease 05/12/2017  . Family history of colonic polyps 05/12/2017  . GAD (generalized anxiety disorder) 05/12/2017  . Pulmonary sarcoidosis ([facility]) 12/16/2014  . Right inguinal hernia 04/20/2014   Past Medical History:  Diagnosis Date  . Anxiety   . Celiac disease   . Diverticulitis    per PET scan asymptomatic  . Hypertension    diet and exercise controlled.  Also has white coat syndrome.  Marland Kitchen Shortness of breath dyspnea    recently    Family History  Problem Relation Age of Onset  . Aneurysm Mother        brain  . Drug abuse Mother   . Rheum arthritis Father   . Prostate cancer Father 24  . Stroke Father 49       preceded bymultiple TIAs  . Hypertension Father   . Hearing loss Father   . Dementia Father   . Colon polyps Father        late 74s.  . Cancer Maternal Aunt        x2  . Hearing loss Brother   . Hypertension Brother   . Diabetes Daughter     Past Surgical History:  Procedure Laterality Date  . COLONOSCOPY    . HERNIA REPAIR Left 2000ish  . HERNIA REPAIR Left 2015  . INGUINAL HERNIA REPAIR Right 04/30/2014   Procedure: LAPAROSCOPIC RIGHT  INGUINAL HERNIA REPAIR ;  Surgeon: Shelly Rubenstein, MD;  Location: [facility];  Service: General;  Laterality: Right;  . INSERTION OF MESH N/A 04/30/2014   Procedure: INSERTION OF MESH;  Surgeon: Shelly Rubenstein, MD;  Location: [facility];  Service: General;  Laterality: N/A;  . MEDIASTINOSCOPY N/A 12/29/2014   Procedure: MEDIASTINOSCOPY;  Surgeon:  Kerin Perna, MD;  Location: [facility] OR;  Service: Thoracic;  Laterality: N/A;  . RETINOPATHY SURGERY     Serous  . trauma surgery      punctured lung lacerated spleen   . VASECTOMY    . VIDEO BRONCHOSCOPY WITH ENDOBRONCHIAL ULTRASOUND N/A 12/29/2014   Procedure: VIDEO BRONCHOSCOPY WITH ENDOBRONCHIAL ULTRASOUND;  Surgeon: Kerin Perna, MD;  Location: [facility] OR;  Service: Thoracic;  Laterality: N/A;   Social History   Occupational History  . Occupation: Secretary/administrator  Tobacco Use  . Smoking status: Former Smoker    Packs/day: 0.25    Years: 1.00    Pack years: 0.25    Types: Cigarettes    Quit date: 09/04/1988    Years since quitting: 30.7  . Smokeless tobacco: Former Systems developer    Types: [place] date: 09/04/1998  Substance and Sexual Activity  . Alcohol use: Yes    Alcohol/week: 2.0 standard drinks    Types: 2 Glasses of wine per week    Comment: once a month  . Drug use: No  . Sexual activity: Yes

## 2019-05-28 NOTE — Addendum Note (Signed)
Addended by: Meyer Cory on: 05/28/2019 10:11 AM   Modules accepted: Orders

## 2019-06-06 ENCOUNTER — Other Ambulatory Visit: Payer: Self-pay | Admitting: Physician Assistant

## 2019-06-07 ENCOUNTER — Other Ambulatory Visit: Payer: Self-pay

## 2019-06-07 ENCOUNTER — Ambulatory Visit
Admission: RE | Admit: 2019-06-07 | Discharge: 2019-06-07 | Disposition: A | Discharge status: Home / Self Care | Payer: PRIVATE HEALTH INSURANCE | Source: Ambulatory Visit | Attending: Orthopaedic Surgery | Admitting: Orthopaedic Surgery

## 2019-06-07 DIAGNOSIS — M23321 Other meniscus derangements, posterior horn of medial meniscus, right knee: Secondary | ICD-10-CM

## 2019-06-27 ENCOUNTER — Other Ambulatory Visit: Payer: Self-pay

## 2019-06-27 DIAGNOSIS — Z20822 Contact with and (suspected) exposure to covid-19: Secondary | ICD-10-CM

## 2019-06-28 LAB — NOVEL CORONAVIRUS, NAA: SARS-CoV-2, NAA: NOT DETECTED

## 2019-07-09 ENCOUNTER — Ambulatory Visit (INDEPENDENT_AMBULATORY_CARE_PROVIDER_SITE_OTHER): Payer: PRIVATE HEALTH INSURANCE | Admitting: Orthopaedic Surgery

## 2019-07-09 ENCOUNTER — Encounter: Payer: Self-pay | Admitting: Orthopaedic Surgery

## 2019-07-09 ENCOUNTER — Other Ambulatory Visit: Payer: Self-pay

## 2019-07-09 VITALS — Ht 73.0 in | Wt 220.0 lb

## 2019-07-09 DIAGNOSIS — M23321 Other meniscus derangements, posterior horn of medial meniscus, right knee: Secondary | ICD-10-CM

## 2019-07-09 NOTE — Progress Notes (Signed)
Office Visit Note   Patient: Chase Mcclure           Date of Birth: December 31, 1970           MRN: [id number] Visit Date: 07/09/2019              Requested by: Brunetta Jeans, PA-C [id number] [place],  [place] [id number] PCP: Brunetta Jeans, PA-C   Assessment & Plan: Visit Diagnoses:  1. Other meniscus derangements, posterior horn of medial meniscus, right knee     Plan: Patient has small knee effusion on MRI scan with small popliteal cyst loculated and posterior medial meniscal tear extending to the root which extends about 30 to 40% of the circumference of the medial meniscus.  He has had persistent mechanical symptoms and would like to proceed with outpatient arthroscopy for treatment of his meniscal tear.  Patient's been through exercise program, anti-inflammatories, intra-articular cortisone injection and has had symptoms for several months including falling in which she had to have multiple sutures in the emergency room.  Patient has been symptomatic greater than 6 months.  Outpatient procedure discussed.  Questions elicited and answered he understands request to proceed.  Follow-Up Instructions: No follow-ups on file.   Orders:  No orders of the defined types were placed in this encounter.  No orders of the defined types were placed in this encounter.     Procedures: No procedures performed   Clinical Data: No additional findings.   Subjective: Chief Complaint  Patient presents with  . Right Knee - Follow-up    MRI Right Knee Review    HPI 48 year old male returns with ongoing problems with his right knee.  He has had symptoms since he had an episode when he fell on 05/25/2019.  He has had previous cortisone injection his knee feels like it will buckle particularly when he goes down the hill or stairs.  Patient fell he had to have 20 stitches due to laceration from the fall.  He has been using a knee sleeve.  Anti-inflammatories been use he is also had  intra-articular cortisone injection.  MRI scan has been obtained and is available for review.  Review of Systems positive for a celiac disease pulmonary sarcoidosis without dyspnea.  Positive for anxiety.  Fall with laceration September 2020 as above.  Otherwise negative is a pertains HPI.   Objective: Vital Signs: Ht 6\' 1"  (1.854 m)   Wt 220 lb (99.8 kg)   BMI 29.03 kg/m   Physical Exam Constitutional:      Appearance: He is well-developed.  HENT:     Head: Normocephalic and atraumatic.  Eyes:     Pupils: Pupils are equal, round, and reactive to light.  Neck:     Thyroid: No thyromegaly.     Trachea: No tracheal deviation.  Cardiovascular:     Rate and Rhythm: Normal rate.  Pulmonary:     Effort: Pulmonary effort is normal.     Breath sounds: No wheezing.  Abdominal:     General: Bowel sounds are normal.     Palpations: Abdomen is soft.  Skin:    General: Skin is warm and dry.     Capillary Refill: Capillary refill takes less than 2 seconds.  Neurological:     Mental Status: He is alert and oriented to person, place, and time.  Psychiatric:        Behavior: Behavior normal.        Thought Content: Thought content  normal.        Judgment: Judgment normal.     Ortho Exam patient has medial joint line tenderness particularly posterior to the medial collateral ligament.  He has full extension pain with hyperextension at the posterior medial joint line.  Is tender in the popliteal region.  ACL PCL exam is normal negative Lachman test.  Distal pulses are 2+.  Knee demonstrates 2+ knee effusion.  Specialty Comments:  No specialty comments available.  Imaging: CLINICAL DATA:  Knee pain, question meniscal tear  EXAM: MRI OF THE RIGHT KNEE WITHOUT CONTRAST  TECHNIQUE: Multiplanar, multisequence MR imaging of the knee was performed. No intravenous contrast was administered.  COMPARISON:  None.  FINDINGS: MENISCI  Medial: There is intrasubstance signal seen in  the posterior horn of the medial meniscus with a nondisplaced complex tear which extends to the root attachment. However the root attachment still appears to be intact.  Lateral: Intact.  LIGAMENTS  Cruciates: The ACL is intact. The PCL is intact.  Collaterals: The MCL is intact. The lateral collateral ligamentous complex is intact.  CARTILAGE  Patellofemoral: There is mild chondral fissuring seen throughout the patellar surface.  Medial compartment: Mild chondral fissuring seen the weight-bearing surface of the medial femoral condyle.  Lateral compartment: Normal.  BONES: No fracture. No avascular necrosis. No pathologic marrow infiltration.  JOINT: There is a small knee joint effusion. Normal Hoffa's fat-pad. No plical thickening.  EXTENSOR MECHANISM: The patellar and quadriceps tendon are intact. The retinaculum is unremarkable.  POPLITEAL FOSSA: A loculated popliteal cyst is present measuring 3 cm in transverse dimension. No evidence of fracture.  OTHER:  Prepatellar subcutaneous edema is noted.  IMPRESSION: 1. Intrasubstance degeneration with a nondisplaced tear of the posterior medial meniscus extending to the root attachment. 2. Mild medial and patellofemoral compartment osteoarthritis 3. Small knee joint effusion 4. Loculated popliteal cyst without evidence of rupture   Electronically Signed   By: Jonna Clark M.D.   On: 06/08/2019 20:10   PMFS History: Patient Active Problem List   Diagnosis Date Noted  . Other meniscus derangements, posterior horn of medial meniscus, right knee 05/28/2019  . Visit for preventive health examination 08/12/2018  . Prostate cancer screening 08/12/2018  . Need for immunization against influenza 08/12/2018  . Right chronic serous otitis media 11/20/2017  . Testosterone insufficiency 10/23/2017  . Need for Tdap vaccination 10/23/2017  . Essential hypertension 05/12/2017  . Bilateral hearing loss  05/12/2017  . Eustachian tube dysfunction, bilateral 05/12/2017  . Celiac disease 05/12/2017  . Family history of colonic polyps 05/12/2017  . GAD (generalized anxiety disorder) 05/12/2017  . Pulmonary sarcoidosis (HCC) 12/16/2014  . Right inguinal hernia 04/20/2014   Past Medical History:  Diagnosis Date  . Anxiety   . Celiac disease   . Diverticulitis    per PET scan asymptomatic  . Hypertension    diet and exercise controlled.  Also has white coat syndrome.  Marland Kitchen Shortness of breath dyspnea    recently    Family History  Problem Relation Age of Onset  . Aneurysm Mother        brain  . Drug abuse Mother   . Rheum arthritis Father   . Prostate cancer Father 56  . Stroke Father 36       preceded bymultiple TIAs  . Hypertension Father   . Hearing loss Father   . Dementia Father   . Colon polyps Father        late 29s.  . Cancer  Maternal Aunt        x2  . Hearing loss Brother   . Hypertension Brother   . Diabetes Daughter     Past Surgical History:  Procedure Laterality Date  . COLONOSCOPY    . HERNIA REPAIR Left 2000ish  . HERNIA REPAIR Left 2015  . INGUINAL HERNIA REPAIR Right 04/30/2014   Procedure: LAPAROSCOPIC RIGHT  INGUINAL HERNIA REPAIR ;  Surgeon: Shelly Rubensteinouglas A Blackman, MD;  Location: [facility];  Service: General;  Laterality: Right;  . INSERTION OF MESH N/A 04/30/2014   Procedure: INSERTION OF MESH;  Surgeon: Shelly Rubensteinouglas A Blackman, MD;  Location: [facility];  Service: General;  Laterality: N/A;  . MEDIASTINOSCOPY N/A 12/29/2014   Procedure: MEDIASTINOSCOPY;  Surgeon: Kerin PernaPeter Van Trigt, MD;  [facility];  Service: Thoracic;  Laterality: N/A;  . RETINOPATHY SURGERY     Serous  . trauma surgery      punctured lung lacerated spleen   . VASECTOMY    . VIDEO BRONCHOSCOPY WITH ENDOBRONCHIAL ULTRASOUND N/A 12/29/2014   Procedure: VIDEO BRONCHOSCOPY WITH ENDOBRONCHIAL ULTRASOUND;  Surgeon: Kerin PernaPeter Van Trigt, MD;  [facility];  Service: Thoracic;  Laterality: N/A;   Social  History   Occupational History  . Occupation: Government social research officercommerical roofing  Tobacco Use  . Smoking status: Former Smoker    Packs/day: 0.25    Years: 1.00    Pack years: 0.25    Types: Cigarettes    Quit date: 09/04/1988    Years since quitting: 30.8  . Smokeless tobacco: Former NeurosurgeonUser    Types: Chew    Quit date: 09/04/1998  Substance and Sexual Activity  . Alcohol use: Yes    Alcohol/week: 2.0 standard drinks    Types: 2 Glasses of wine per week    Comment: once a month  . Drug use: No  . Sexual activity: Yes

## 2019-07-14 ENCOUNTER — Encounter: Payer: Self-pay | Admitting: Orthopaedic Surgery

## 2019-07-14 ENCOUNTER — Other Ambulatory Visit: Payer: Self-pay | Admitting: Orthopaedic Surgery

## 2019-07-14 DIAGNOSIS — S83241A Other tear of medial meniscus, current injury, right knee, initial encounter: Secondary | ICD-10-CM | POA: Diagnosis not present

## 2019-07-14 MED ORDER — OXYCODONE-ACETAMINOPHEN 5-325 MG PO TABS: 1.0000 | ORAL_TABLET | Freq: Four times a day (QID) | ORAL | 0 refills | Status: DC | PRN

## 2019-07-14 NOTE — Progress Notes (Signed)
Posterior mm tear with MFC chondral flap tear

## 2019-07-20 DIAGNOSIS — S83209A Unspecified tear of unspecified meniscus, current injury, unspecified knee, initial encounter: Secondary | ICD-10-CM | POA: Insufficient documentation

## 2019-07-22 ENCOUNTER — Ambulatory Visit (INDEPENDENT_AMBULATORY_CARE_PROVIDER_SITE_OTHER): Payer: PRIVATE HEALTH INSURANCE | Admitting: Orthopaedic Surgery

## 2019-07-22 ENCOUNTER — Other Ambulatory Visit: Payer: Self-pay

## 2019-07-22 ENCOUNTER — Encounter: Payer: Self-pay | Admitting: Orthopaedic Surgery

## 2019-07-22 VITALS — BP 121/86 | HR 78 | Ht 73.0 in | Wt 220.0 lb

## 2019-07-22 DIAGNOSIS — M23321 Other meniscus derangements, posterior horn of medial meniscus, right knee: Secondary | ICD-10-CM

## 2019-07-22 NOTE — Progress Notes (Signed)
Post-Op Visit Note   Patient: Chase Mcclure           Date of Birth: 06/19/1971           MRN: [id number] Visit Date: 07/22/2019 PCP: Waldon Merl, PA-C   Assessment & Plan: Post knee arthroscopy 07/14/2019 right knee with posterior medial meniscal tear and medial femoral condyle cartilage flap tear.  States his knee is better he has some swelling Steri-Strips changed.  He was doing some work from home and has been back in the office.  Continue use some ice he can use some intermittent anti-inflammatories.  I plan to check him back again in about 4 weeks.  Chief Complaint:  Chief Complaint  Patient presents with  . Right Knee - Routine Post Op    07/14/2019 Right Knee Arthroscopy, PMM   Visit Diagnoses:  1. Other meniscus derangements, posterior horn of medial meniscus, right knee     Plan: Continue ice intermittent anti-inflammatories.  Arthroscopic photos and operative findings were reviewed.  Recheck 4 weeks.  Follow-Up Instructions: Return in about 4 weeks (around 08/19/2019).   Orders:  No orders of the defined types were placed in this encounter.  No orders of the defined types were placed in this encounter.   Imaging: No results found.  PMFS History: Patient Active Problem List   Diagnosis Date Noted  . Other meniscus derangements, posterior horn of medial meniscus, right knee 05/28/2019  . Visit for preventive health examination 08/12/2018  . Prostate cancer screening 08/12/2018  . Need for immunization against influenza 08/12/2018  . Right chronic serous otitis media 11/20/2017  . Testosterone insufficiency 10/23/2017  . Need for Tdap vaccination 10/23/2017  . Essential hypertension 05/12/2017  . Bilateral hearing loss 05/12/2017  . Eustachian tube dysfunction, bilateral 05/12/2017  . Celiac disease 05/12/2017  . Family history of colonic polyps 05/12/2017  . GAD (generalized anxiety disorder) 05/12/2017  . Pulmonary sarcoidosis (HCC) 12/16/2014  .  Right inguinal hernia 04/20/2014   Past Medical History:  Diagnosis Date  . Anxiety   . Celiac disease   . Diverticulitis    per PET scan asymptomatic  . Hypertension    diet and exercise controlled.  Also has white coat syndrome.  Marland Kitchen Shortness of breath dyspnea    recently    Family History  Problem Relation Age of Onset  . Aneurysm Mother        brain  . Drug abuse Mother   . Rheum arthritis Father   . Prostate cancer Father 18  . Stroke Father 25       preceded bymultiple TIAs  . Hypertension Father   . Hearing loss Father   . Dementia Father   . Colon polyps Father        late 63s.  . Cancer Maternal Aunt        x2  . Hearing loss Brother   . Hypertension Brother   . Diabetes Daughter     Past Surgical History:  Procedure Laterality Date  . COLONOSCOPY    . HERNIA REPAIR Left 2000ish  . HERNIA REPAIR Left 2015  . INGUINAL HERNIA REPAIR Right 04/30/2014   Procedure: LAPAROSCOPIC RIGHT  INGUINAL HERNIA REPAIR ;  Surgeon: Shelly Rubenstein, MD;  Location: [facility];  Service: General;  Laterality: Right;  . INSERTION OF MESH N/A 04/30/2014   Procedure: INSERTION OF MESH;  Surgeon: Shelly Rubenstein, MD;  Location: [facility];  Service: General;  Laterality: N/A;  . MEDIASTINOSCOPY  N/A 12/29/2014   Procedure: MEDIASTINOSCOPY;  Surgeon: Ivin Poot, MD;  Location: [place];  Service: Thoracic;  Laterality: N/A;  . RETINOPATHY SURGERY     Serous  . trauma surgery      punctured lung lacerated spleen   . VASECTOMY    . VIDEO BRONCHOSCOPY WITH ENDOBRONCHIAL ULTRASOUND N/A 12/29/2014   Procedure: VIDEO BRONCHOSCOPY WITH ENDOBRONCHIAL ULTRASOUND;  Surgeon: Ivin Poot, MD;  Location: [place];  Service: Thoracic;  Laterality: N/A;   Social History   Occupational History  . Occupation: Secretary/administrator  Tobacco Use  . Smoking status: Former Smoker    Packs/day: 0.25    Years: 1.00    Pack years: 0.25    Types: Cigarettes    Quit date: 09/04/1988    Years since  quitting: 30.8  . Smokeless tobacco: Former Systems developer    Types: [place] date: 09/04/1998  Substance and Sexual Activity  . Alcohol use: Yes    Alcohol/week: 2.0 standard drinks    Types: 2 Glasses of wine per week    Comment: once a month  . Drug use: No  . Sexual activity: Yes

## 2019-07-23 ENCOUNTER — Inpatient Hospital Stay: Payer: PRIVATE HEALTH INSURANCE | Admitting: Orthopaedic Surgery

## 2019-08-19 ENCOUNTER — Encounter: Payer: Self-pay | Admitting: Orthopaedic Surgery

## 2019-08-19 ENCOUNTER — Other Ambulatory Visit: Payer: Self-pay

## 2019-08-19 ENCOUNTER — Ambulatory Visit (INDEPENDENT_AMBULATORY_CARE_PROVIDER_SITE_OTHER): Payer: PRIVATE HEALTH INSURANCE | Admitting: Orthopaedic Surgery

## 2019-08-19 VITALS — BP 158/102 | HR 74 | Ht 73.0 in | Wt 220.0 lb

## 2019-08-19 DIAGNOSIS — M23321 Other meniscus derangements, posterior horn of medial meniscus, right knee: Secondary | ICD-10-CM

## 2019-08-19 NOTE — Progress Notes (Signed)
Post-Op Visit Note   Patient: Chase Mcclure           Date of Birth: Oct 20, 1970           MRN: [id number] Visit Date: 08/19/2019 PCP: Brunetta Jeans, PA-C   Assessment & Plan: Post knee arthroscopy and medial femoral condyle chondroplasty with gradual slow improvement in gait and decreasing limp.  Continue to work on Academic librarian.  Use anti-inflammatories continue to work on normal gait pattern.  Recheck 1 month.  Chief Complaint:  Chief Complaint  Patient presents with  . Right Knee - Follow-up    07/14/2019 Right knee arthroscopy, PMM   Visit Diagnoses:  1. Other meniscus derangements, posterior horn of medial meniscus, right knee             Post op.   Plan: Patient returns post right knee arthroscopy posterior meniscal tear and medial femoral condyle area was significant chondromalacia.  He has problems squatting he is occasionally noted mild swelling he has a knee sleeve.  He is not been using anti-inflammatory and has Advil at home we discussed taking it with food and also discussed therapeutic dosage.  We will check him back in a month.  He has some stiffness after prolonged sitting but is noting improvement in his knee every week.  Follow-Up Instructions: Return in about 1 month (around 09/19/2019).   Orders:  No orders of the defined types were placed in this encounter.  No orders of the defined types were placed in this encounter.   Imaging: No results found.  PMFS History: Patient Active Problem List   Diagnosis Date Noted  . Other meniscus derangements, posterior horn of medial meniscus, right knee 05/28/2019  . Visit for preventive health examination 08/12/2018  . Prostate cancer screening 08/12/2018  . Need for immunization against influenza 08/12/2018  . Right chronic serous otitis media 11/20/2017  . Testosterone insufficiency 10/23/2017  . Need for Tdap vaccination 10/23/2017  . Essential hypertension 05/12/2017  . Bilateral hearing  loss 05/12/2017  . Eustachian tube dysfunction, bilateral 05/12/2017  . Celiac disease 05/12/2017  . Family history of colonic polyps 05/12/2017  . GAD (generalized anxiety disorder) 05/12/2017  . Pulmonary sarcoidosis ([place]) 12/16/2014  . Right inguinal hernia 04/20/2014   Past Medical History:  Diagnosis Date  . Anxiety   . Celiac disease   . Diverticulitis    per PET scan asymptomatic  . Hypertension    diet and exercise controlled.  Also has white coat syndrome.  Marland Kitchen Shortness of breath dyspnea    recently    Family History  Problem Relation Age of Onset  . Aneurysm Mother        brain  . Drug abuse Mother   . Rheum arthritis Father   . Prostate cancer Father 48  . Stroke Father 60       preceded bymultiple TIAs  . Hypertension Father   . Hearing loss Father   . Dementia Father   . Colon polyps Father        late 57s.  . Cancer Maternal Aunt        x2  . Hearing loss Brother   . Hypertension Brother   . Diabetes Daughter     Past Surgical History:  Procedure Laterality Date  . COLONOSCOPY    . HERNIA REPAIR Left 2000ish  . HERNIA REPAIR Left 2015  . INGUINAL HERNIA REPAIR Right 04/30/2014   Procedure: LAPAROSCOPIC RIGHT  INGUINAL HERNIA REPAIR ;  Surgeon: Shelly Rubenstein, MD;  Location[facility];  Service: General;  Laterality: Right;  . INSERTION OF MESH N/A 04/30/2014   Procedure: INSERTION OF MESH;  Surgeon: Shelly Rubenstein, MD;  Location: [facility];  Service: General;  Laterality: N/A;  . MEDIASTINOSCOPY N/A 12/29/2014   Procedure: MEDIASTINOSCOPY;  Surgeon: Kerin Perna, MD;  Location: [facility];  Service: Thoracic;  Laterality: N/A;  . RETINOPATHY SURGERY     Serous  . trauma surgery      punctured lung lacerated spleen   . VASECTOMY    . VIDEO BRONCHOSCOPY WITH ENDOBRONCHIAL ULTRASOUND N/A 12/29/2014   Procedure: VIDEO BRONCHOSCOPY WITH ENDOBRONCHIAL ULTRASOUND;  Surgeon: Kerin Perna, MD;  Location: [facility] OR;  Service: Thoracic;  Laterality: N/A;    Social History   Occupational History  . Occupation: Government social research officer  Tobacco Use  . Smoking status: Former Smoker    Packs/day: 0.25    Years: 1.00    Pack years: 0.25    Types: Cigarettes    Quit date: 09/04/1988    Years since quitting: 30.9  . Smokeless tobacco: Former Neurosurgeon    Types: Chew    Quit date: 09/04/1998  Substance and Sexual Activity  . Alcohol use: Yes    Alcohol/week: 2.0 standard drinks    Types: 2 Glasses of wine per week    Comment: once a month  . Drug use: No  . Sexual activity: Yes

## 2019-09-02 ENCOUNTER — Other Ambulatory Visit: Payer: Self-pay | Admitting: Physician Assistant

## 2019-09-02 ENCOUNTER — Encounter: Payer: Self-pay | Admitting: Emergency Medicine

## 2019-09-19 ENCOUNTER — Ambulatory Visit: Payer: PRIVATE HEALTH INSURANCE | Admitting: Orthopaedic Surgery

## 2019-09-26 ENCOUNTER — Other Ambulatory Visit: Payer: Self-pay | Admitting: Physician Assistant

## 2019-12-20 ENCOUNTER — Other Ambulatory Visit: Payer: Self-pay | Admitting: Physician Assistant

## 2019-12-29 ENCOUNTER — Encounter: Payer: PRIVATE HEALTH INSURANCE | Admitting: Physician Assistant

## 2019-12-30 ENCOUNTER — Other Ambulatory Visit: Payer: Self-pay

## 2019-12-30 ENCOUNTER — Ambulatory Visit (INDEPENDENT_AMBULATORY_CARE_PROVIDER_SITE_OTHER): Payer: PRIVATE HEALTH INSURANCE | Admitting: Physician Assistant

## 2019-12-30 ENCOUNTER — Encounter: Payer: Self-pay | Admitting: Physician Assistant

## 2019-12-30 VITALS — BP 118/80 | HR 72 | Temp 98.8°F | Resp 16 | Ht 73.0 in | Wt 229.0 lb

## 2019-12-30 DIAGNOSIS — Z125 Encounter for screening for malignant neoplasm of prostate: Secondary | ICD-10-CM | POA: Diagnosis not present

## 2019-12-30 DIAGNOSIS — Z0000 Encounter for general adult medical examination without abnormal findings: Secondary | ICD-10-CM | POA: Diagnosis not present

## 2019-12-30 DIAGNOSIS — I10 Essential (primary) hypertension: Secondary | ICD-10-CM

## 2019-12-30 DIAGNOSIS — E669 Obesity, unspecified: Secondary | ICD-10-CM | POA: Diagnosis not present

## 2019-12-30 LAB — CBC WITH DIFFERENTIAL/PLATELET
Basophils Absolute: 0 10*3/uL (ref 0.0–0.1)
Basophils Relative: 0.3 % (ref 0.0–3.0)
Eosinophils Absolute: 0.2 10*3/uL (ref 0.0–0.7)
Eosinophils Relative: 3.6 % (ref 0.0–5.0)
HCT: 46.8 % (ref 39.0–52.0)
Hemoglobin: 16.5 g/dL (ref 13.0–17.0)
Lymphocytes Relative: 24 % (ref 12.0–46.0)
Lymphs Abs: 1.6 10*3/uL (ref 0.7–4.0)
MCHC: 35.2 g/dL (ref 30.0–36.0)
MCV: 99 fl (ref 78.0–100.0)
Monocytes Absolute: 0.6 10*3/uL (ref 0.1–1.0)
Monocytes Relative: 8.2 % (ref 3.0–12.0)
Neutro Abs: 4.3 10*3/uL (ref 1.4–7.7)
Neutrophils Relative %: 63.9 % (ref 43.0–77.0)
Platelets: 267 10*3/uL (ref 150.0–400.0)
RBC: 4.73 Mil/uL (ref 4.22–5.81)
RDW: 12.8 % (ref 11.5–15.5)
WBC: 6.8 10*3/uL (ref 4.0–10.5)

## 2019-12-30 LAB — COMPREHENSIVE METABOLIC PANEL
ALT: 56 U/L — ABNORMAL HIGH (ref 0–53)
AST: 37 U/L (ref 0–37)
Albumin: 4.7 g/dL (ref 3.5–5.2)
Alkaline Phosphatase: 73 U/L (ref 39–117)
BUN: 10 mg/dL (ref 6–23)
CO2: 29 mEq/L (ref 19–32)
Calcium: 9.4 mg/dL (ref 8.4–10.5)
Chloride: 100 mEq/L (ref 96–112)
Creatinine, Ser: 0.8 mg/dL (ref 0.40–1.50)
GFR: 101.81 mL/min (ref >60.00)
Glucose, Bld: 112 mg/dL — ABNORMAL HIGH (ref 70–99)
Potassium: 4.3 mEq/L (ref 3.5–5.1)
Sodium: 135 mEq/L (ref 135–145)
Total Bilirubin: 0.8 mg/dL (ref 0.2–1.2)
Total Protein: 7.4 g/dL (ref 6.0–8.3)

## 2019-12-30 LAB — PSA: PSA: 0.76 ng/mL (ref 0.10–4.00)

## 2019-12-30 LAB — LIPID PANEL
Cholesterol: 240 mg/dL — ABNORMAL HIGH (ref 0–200)
HDL: 54.5 mg/dL (ref >39.00)
LDL Cholesterol: 167 mg/dL — ABNORMAL HIGH (ref 0–99)
NonHDL: 185.03
Total CHOL/HDL Ratio: 4
Triglycerides: 92 mg/dL (ref 0.0–149.0)
VLDL: 18.4 mg/dL (ref 0.0–40.0)

## 2019-12-30 LAB — HEMOGLOBIN A1C: Hgb A1c MFr Bld: 5.1 % (ref 4.6–6.5)

## 2019-12-30 NOTE — Patient Instructions (Addendum)
Please go to the lab for blood work.   Our office will call you with your results unless you have chosen to receive results via MyChart.  If your blood work is normal we will follow-up each year for physicals and as scheduled for chronic medical problems.  If anything is abnormal we will treat accordingly and get you in for a follow-up.   Preventive Care 40-51 Years Old, Male Preventive care refers to lifestyle choices and visits with your health care provider that can promote health and wellness. This includes:  A yearly physical exam. This is also called an annual well check.  Regular dental and eye exams.  Immunizations.  Screening for certain conditions.  Healthy lifestyle choices, such as eating a healthy diet, getting regular exercise, not using drugs or products that contain nicotine and tobacco, and limiting alcohol use. What can I expect for my preventive care visit? Physical exam Your health care provider will check:  Height and weight. These may be used to calculate body mass index (BMI), which is a measurement that tells if you are at a healthy weight.  Heart rate and blood pressure.  Your skin for abnormal spots. Counseling Your health care provider may ask you questions about:  Alcohol, tobacco, and drug use.  Emotional well-being.  Home and relationship well-being.  Sexual activity.  Eating habits.  Work and work environment. What immunizations do I need?  Influenza (flu) vaccine  This is recommended every year. Tetanus, diphtheria, and pertussis (Tdap) vaccine  You may need a Td booster every 10 years. Varicella (chickenpox) vaccine  You may need this vaccine if you have not already been vaccinated. Zoster (shingles) vaccine  You may need this after age 51. Measles, mumps, and rubella (MMR) vaccine  You may need at least one dose of MMR if you were born in 1957 or later. You may also need a second dose. Pneumococcal conjugate (PCV13)  vaccine  You may need this if you have certain conditions and were not previously vaccinated. Pneumococcal polysaccharide (PPSV23) vaccine  You may need one or two doses if you smoke cigarettes or if you have certain conditions. Meningococcal conjugate (MenACWY) vaccine  You may need this if you have certain conditions. Hepatitis A vaccine  You may need this if you have certain conditions or if you travel or work in places where you may be exposed to hepatitis A. Hepatitis B vaccine  You may need this if you have certain conditions or if you travel or work in places where you may be exposed to hepatitis B. Haemophilus influenzae type b (Hib) vaccine  You may need this if you have certain risk factors. Human papillomavirus (HPV) vaccine  If recommended by your health care provider, you may need three doses over 6 months. You may receive vaccines as individual doses or as more than one vaccine together in one shot (combination vaccines). Talk with your health care provider about the risks and benefits of combination vaccines. What tests do I need? Blood tests  Lipid and cholesterol levels. These may be checked every 5 years, or more frequently if you are over 51 years old.  Hepatitis C test.  Hepatitis B test. Screening  Lung cancer screening. You may have this screening every year starting at age 51 if you have a 30-pack-year history of smoking and currently smoke or have quit within the past 15 years.  Prostate cancer screening. Recommendations will vary depending on your family history and other risks.  Colorectal cancer   screening. All adults should have this screening starting at age 51 and continuing until age 75. Your health care provider may recommend screening at age 45 if you are at increased risk. You will have tests every 1-10 years, depending on your results and the type of screening test.  Diabetes screening. This is done by checking your blood sugar (glucose) after  you have not eaten for a while (fasting). You may have this done every 1-3 years.  Sexually transmitted disease (STD) testing. Follow these instructions at home: Eating and drinking  Eat a diet that includes fresh fruits and vegetables, whole grains, lean protein, and low-fat dairy products.  Take vitamin and mineral supplements as recommended by your health care provider.  Do not drink alcohol if your health care provider tells you not to drink.  If you drink alcohol: ? Limit how much you have to 0-2 drinks a day. ? Be aware of how much alcohol is in your drink. In the [place] one drink equals one 12 oz bottle of beer (355 mL), one 5 oz glass of wine (148 mL), or one 1 oz glass of hard liquor (44 mL). Lifestyle  Take daily care of your teeth and gums.  Stay active. Exercise for at least 30 minutes on 5 or more days each week.  Do not use any products that contain nicotine or tobacco, such as cigarettes, e-cigarettes, and chewing tobacco. If you need help quitting, ask your health care provider.  If you are sexually active, practice safe sex. Use a condom or other form of protection to prevent STIs (sexually transmitted infections).  Talk with your health care provider about taking a low-dose aspirin every day starting at age 51. What's next?  Go to your health care provider once a year for a well check visit.  Ask your health care provider how often you should have your eyes and teeth checked.  Stay up to date on all vaccines. This information is not intended to replace advice given to you by your health care provider. Make sure you discuss any questions you have with your health care provider. Document Revised: 08/15/2018 Document Reviewed: 08/15/2018 Elsevier Patient Education  2020 [employer] .      

## 2019-12-30 NOTE — Progress Notes (Signed)
Patient presents to clinic today for annual exam.  Patient is fasting for labs.  Patient with history of hypertension, currently on a regimen of olmesartan 20 mg once daily.  Is following a low-salt diet.  Notes have been getting regular exercise but this has been limited since his knee surgery a few months ago. Patient denies chest pain, palpitations, lightheadedness, dizziness, vision changes or frequent headaches.  Patient also with history of generalized anxiety disorder, currently on a regimen of fluoxetine 40 mg daily.  Endorses taking medication as directed and still tolerating well without noted side effect.  Notes substantial relief in symptoms with medication.  Denies breakthrough symptoms.  Acute Concerns: Denies acute concerns today.  Health Maintenance: Immunizations -- UTD Colonoscopy -- UTD  Past Medical History:  Diagnosis Date  . Anxiety   . Celiac disease   . Diverticulitis    per PET scan asymptomatic  . Hypertension    diet and exercise controlled.  Also has white coat syndrome.  Marland Kitchen Shortness of breath dyspnea    recently    Past Surgical History:  Procedure Laterality Date  . COLONOSCOPY    . HERNIA REPAIR Left 2000ish  . HERNIA REPAIR Left 2015  . INGUINAL HERNIA REPAIR Right 04/30/2014   Procedure: LAPAROSCOPIC RIGHT  INGUINAL HERNIA REPAIR ;  Surgeon: Shelly Rubenstein, MD;  Location: [facility];  Service: General;  Laterality: Right;  . INSERTION OF MESH N/A 04/30/2014   Procedure: INSERTION OF MESH;  Surgeon: Shelly Rubenstein, MD;  Location: [facility];  Service: General;  Laterality: N/A;  . KNEE ARTHROSCOPY W/ MENISCAL REPAIR Right   . MEDIASTINOSCOPY N/A 12/29/2014   Procedure: MEDIASTINOSCOPY;  Surgeon: Kerin Perna, MD;  Location: [facility] OR;  Service: Thoracic;  Laterality: N/A;  . RETINOPATHY SURGERY     Serous  . trauma surgery      punctured lung lacerated spleen   . VASECTOMY    . VIDEO BRONCHOSCOPY WITH ENDOBRONCHIAL ULTRASOUND N/A 12/29/2014     Procedure: VIDEO BRONCHOSCOPY WITH ENDOBRONCHIAL ULTRASOUND;  Surgeon: Kerin Perna, MD;  Location: [facility] OR;  Service: Thoracic;  Laterality: N/A;    Current Outpatient Medications on File Prior to Visit  Medication Sig Dispense Refill  . cetirizine (ZYRTEC) 10 MG tablet Take 10 mg by mouth daily.    Marland Kitchen FLUoxetine (PROZAC) 40 MG capsule TAKE 1 CAPSULE BY MOUTH EVERY DAY *NEED OFFICE VISIT* 30 capsule 0  . montelukast (SINGULAIR) 10 MG tablet Take 10 mg by mouth at bedtime.    Marland Kitchen olmesartan (BENICAR) 20 MG tablet TAKE 1 TABLET BY MOUTH EVERY DAY *NEED OFFICE VISIT* 30 tablet 0  . Turmeric 400 MG CAPS Take 1 capsule by mouth daily.    . vitamin B-12 (CYANOCOBALAMIN) 1000 MCG tablet Take 1,000 mcg by mouth daily.    . Vitamin D, Ergocalciferol, 2000 units CAPS Take 2 capsules by mouth daily. 30 capsule 0   No current facility-administered medications on file prior to visit.    Allergies  Allergen Reactions  . Gluten Meal Other (See Comments)    Celiac disease    Family History  Problem Relation Age of Onset  . Aneurysm Mother        brain  . Drug abuse Mother   . Rheum arthritis Father   . Prostate cancer Father 46  . Stroke Father 63       preceded bymultiple TIAs  . Hypertension Father   . Hearing loss Father   .  Dementia Father   . Colon polyps Father        late 1s.  . Cancer Maternal Aunt        x2  . Hearing loss Brother   . Hypertension Brother   . Diabetes Daughter     Social History   Socioeconomic History  . Marital status: Married    Spouse name: Not on file  . Number of children: 3  . Years of education: Not on file  . Highest education level: Not on file  Occupational History  . Occupation: Government social research officer  Tobacco Use  . Smoking status: Former Smoker    Packs/day: 0.25    Years: 1.00    Pack years: 0.25    Types: Cigarettes    Quit date: 09/04/1988    Years since quitting: 31.3  . Smokeless tobacco: Former Neurosurgeon    Types: Chew    Quit date:  09/04/1998  Substance and Sexual Activity  . Alcohol use: Yes    Alcohol/week: 2.0 standard drinks    Types: 2 Glasses of wine per week    Comment: once a month  . Drug use: No  . Sexual activity: Yes  Other Topics Concern  . Not on file  Social History Narrative   Very active job   Social Determinants of Corporate investment banker Strain:   . Difficulty of Paying Living Expenses:   Food Insecurity:   . Worried About Programme researcher, broadcasting/film/video in the Last Year:   . Barista in the Last Year:   Transportation Needs:   . Freight forwarder (Medical):   Marland Kitchen Lack of Transportation (Non-Medical):   Physical Activity:   . Days of Exercise per Week:   . Minutes of Exercise per Session:   Stress:   . Feeling of Stress :   Social Connections:   . Frequency of Communication with Friends and Family:   . Frequency of Social Gatherings with Friends and Family:   . Attends Religious Services:   . Active Member of Clubs or Organizations:   . Attends Banker Meetings:   Marland Kitchen Marital Status:   Intimate Partner Violence:   . Fear of Current or Ex-Partner:   . Emotionally Abused:   Marland Kitchen Physically Abused:   . Sexually Abused:    Review of Systems  Constitutional: Negative for fever and weight loss.  HENT: Negative for ear discharge, ear pain, hearing loss and tinnitus.   Eyes: Negative for blurred vision, double vision, photophobia and pain.  Respiratory: Negative for cough and shortness of breath.   Cardiovascular: Negative for chest pain and palpitations.  Gastrointestinal: Negative for abdominal pain, blood in stool, constipation, diarrhea, heartburn, melena, nausea and vomiting.  Genitourinary: Negative for dysuria, flank pain, frequency, hematuria and urgency.  Musculoskeletal: Positive for joint pain (knees, s/p surgical procedure -- followed by Orthopedics). Negative for falls.  Neurological: Negative for dizziness, loss of consciousness and headaches.    Endo/Heme/Allergies: Negative for environmental allergies.  Psychiatric/Behavioral: Negative for depression, hallucinations, substance abuse and suicidal ideas. The patient is not nervous/anxious and does not have insomnia.    BP 118/80   Pulse 72   Temp 98.8 F (37.1 C) (Temporal)   Resp 16   Ht 6\' 1"  (1.854 m)   Wt 229 lb (103.9 kg)   SpO2 98%   BMI 30.21 kg/m   Physical Exam Vitals reviewed.  Constitutional:      General: He is not in acute distress.  Appearance: Normal appearance. He is well-developed. He is not diaphoretic.  HENT:     Head: Normocephalic and atraumatic.     Right Ear: Tympanic membrane, ear canal and external ear normal.     Left Ear: Tympanic membrane, ear canal and external ear normal.     Nose: Nose normal.     Mouth/Throat:     Pharynx: No posterior oropharyngeal erythema.  Eyes:     Conjunctiva/sclera: Conjunctivae normal.     Pupils: Pupils are equal, round, and reactive to light.  Neck:     Thyroid: No thyromegaly.  Cardiovascular:     Rate and Rhythm: Normal rate and regular rhythm.     Pulses: Normal pulses.     Heart sounds: Normal heart sounds.  Pulmonary:     Effort: Pulmonary effort is normal. No respiratory distress.     Breath sounds: Normal breath sounds. No wheezing or rales.  Chest:     Chest wall: No tenderness.  Abdominal:     General: Bowel sounds are normal. There is no distension.     Palpations: Abdomen is soft. There is no mass.     Tenderness: There is no abdominal tenderness. There is no guarding or rebound.  Musculoskeletal:     Cervical back: Neck supple.  Lymphadenopathy:     Cervical: No cervical adenopathy.  Skin:    General: Skin is warm and dry.     Findings: No rash.  Neurological:     Mental Status: He is alert and oriented to person, place, and time.     Cranial Nerves: No cranial nerve deficit.     Assessment/Plan: 1. Visit for preventive health examination Depression screen negative. Health  Maintenance reviewed. Preventive schedule discussed and handout given in AVS. Will obtain fasting labs today. - CBC with Differential/Platelet - Comprehensive metabolic panel - Hemoglobin A1c - Lipid panel  2. Prostate cancer screening The natural history of prostate cancer has been discussed with the patient. He indicates understanding of the limitations of this screening test and wishes to proceed with screening PSA testing.  - PSA  3. Essential hypertension BP normotensive. Asymptomatic. Continue current regimen. Repeat labs today.  4. Obesity (BMI 30-39.9) Patient to work on diet. Exercise limited at present due to his knee. Has follow-up scheduled with Orthopedics. Labs today to further risk stratify.     This visit occurred during the SARS-CoV-2 public health emergency.  Safety protocols were in place, including screening questions prior to the visit, additional usage of staff PPE, and extensive cleaning of exam room while observing appropriate contact time as indicated for disinfecting solutions.        Leeanne Rio, PA-C

## 2020-01-01 ENCOUNTER — Other Ambulatory Visit: Payer: Self-pay

## 2020-01-01 DIAGNOSIS — E7800 Pure hypercholesterolemia, unspecified: Secondary | ICD-10-CM

## 2020-01-01 DIAGNOSIS — R748 Abnormal levels of other serum enzymes: Secondary | ICD-10-CM

## 2020-01-15 ENCOUNTER — Other Ambulatory Visit: Payer: Self-pay | Admitting: Physician Assistant

## 2020-04-01 ENCOUNTER — Other Ambulatory Visit: Payer: Self-pay

## 2020-04-01 ENCOUNTER — Ambulatory Visit (INDEPENDENT_AMBULATORY_CARE_PROVIDER_SITE_OTHER): Payer: PRIVATE HEALTH INSURANCE

## 2020-04-01 DIAGNOSIS — R748 Abnormal levels of other serum enzymes: Secondary | ICD-10-CM

## 2020-04-01 DIAGNOSIS — E7800 Pure hypercholesterolemia, unspecified: Secondary | ICD-10-CM | POA: Diagnosis not present

## 2020-04-01 LAB — HEPATIC FUNCTION PANEL
ALT: 51 U/L (ref 0–53)
AST: 29 U/L (ref 0–37)
Albumin: 4.3 g/dL (ref 3.5–5.2)
Alkaline Phosphatase: 76 U/L (ref 39–117)
Bilirubin, Direct: 0.1 mg/dL (ref 0.0–0.3)
Total Bilirubin: 0.6 mg/dL (ref 0.2–1.2)
Total Protein: 6.8 g/dL (ref 6.0–8.3)

## 2020-04-01 LAB — LIPID PANEL
Cholesterol: 192 mg/dL (ref 0–200)
HDL: 44.3 mg/dL (ref >39.00)
LDL Cholesterol: 127 mg/dL — ABNORMAL HIGH (ref 0–99)
NonHDL: 147.21
Total CHOL/HDL Ratio: 4
Triglycerides: 101 mg/dL (ref 0.0–149.0)
VLDL: 20.2 mg/dL (ref 0.0–40.0)

## 2020-04-15 ENCOUNTER — Other Ambulatory Visit: Payer: Self-pay | Admitting: Physician Assistant

## 2020-04-16 ENCOUNTER — Encounter: Payer: Self-pay | Admitting: Orthopaedic Surgery

## 2020-04-16 ENCOUNTER — Ambulatory Visit (INDEPENDENT_AMBULATORY_CARE_PROVIDER_SITE_OTHER): Payer: PRIVATE HEALTH INSURANCE

## 2020-04-16 ENCOUNTER — Ambulatory Visit (INDEPENDENT_AMBULATORY_CARE_PROVIDER_SITE_OTHER): Payer: PRIVATE HEALTH INSURANCE | Admitting: Orthopaedic Surgery

## 2020-04-16 VITALS — BP 142/87 | HR 70 | Ht 72.0 in | Wt 220.0 lb

## 2020-04-16 DIAGNOSIS — G8929 Other chronic pain: Secondary | ICD-10-CM

## 2020-04-16 DIAGNOSIS — M25561 Pain in right knee: Secondary | ICD-10-CM

## 2020-04-16 MED ORDER — LIDOCAINE HCL 1 % IJ SOLN
0.5000 mL | INTRAMUSCULAR | Status: AC | PRN
  Administered 2020-04-16: .5 mL

## 2020-04-16 MED ORDER — BUPIVACAINE HCL 0.25 % IJ SOLN
4.0000 mL | INTRAMUSCULAR | Status: AC | PRN
  Administered 2020-04-16: 4 mL via INTRA_ARTICULAR

## 2020-04-16 MED ORDER — METHYLPREDNISOLONE ACETATE 40 MG/ML IJ SUSP
40.0000 mg | INTRAMUSCULAR | Status: AC | PRN
  Administered 2020-04-16: 40 mg via INTRA_ARTICULAR

## 2020-04-16 NOTE — Progress Notes (Signed)
Office Visit Note   Patient: Chase Mcclure           Date of Birth: 1971/08/22           MRN: [id number] Visit Date: 04/16/2020              Requested by: Waldon Merl, PA-C [id number] [place],  [place] [address] PCP: Waldon Merl, PA-C   Assessment & Plan: Visit Diagnoses:  1. 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  He can use 2 Aleve in the morning on occasion.  Intra-articular injection performed which she tolerated well.  Follow-Up Instructions: No follow-ups on file.   Orders:  Orders Placed This Encounter  Procedures  . XR KNEE 3 VIEW RIGHT   No orders of the defined types were placed in this encounter.     Procedures: Large Joint Inj: R knee on 04/16/2020 1:23 PM Indications: pain and joint swelling Details: 22 G 1.5 in needle, anterolateral approach  Arthrogram: No  Medications: 40 mg methylPREDNISolone acetate 40 MG/ML; 0.5 mL lidocaine 1 %; 4 mL bupivacaine 0.25 % Outcome: tolerated well, no immediate complications Procedure, treatment alternatives, risks and benefits explained, specific risks discussed. Consent was given by the patient. Immediately prior to procedure a time out was called to verify the correct patient, procedure, equipment, support staff and site/side marked as required. Patient was prepped and draped in the usual sterile fashion.       Clinical Data: No additional findings.   Subjective: Chief Complaint  Patient presents with  . 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  He states he still continues to  limp his knee feels tight.  When he tries to play golf for the first time when he squatted down he felt increased pain and had to modify getting down to line up a pot.  He has not fallen since the surgery where he had a posterior medial meniscal tear.  He still feels tightness no problems with his opposite hip no numbness or tingling in his feet no back pain or hip pain.  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  Patient later had Covid as well as his daughter but not his wife and convalesced at home with some hypoxia but got better did not require oxygen.  He states he is back to normal activity doing well and has had the vaccine.  Review of Systems all other systems are unchanged since it is November 2020 surgery.   Objective: Vital Signs: BP (!) 142/87   Pulse 70   Ht 6' (1.829 m)   Wt 220 lb (99.8 kg)   BMI 29.84 kg/m   Physical Exam Constitutional:      Appearance: He is well-developed.  HENT:     Head: Normocephalic and atraumatic.  Eyes:     Pupils: Pupils are equal, round, and reactive to light.  Neck:     Thyroid: No thyromegaly.     Trachea: No tracheal deviation.  Cardiovascular:     Rate and Rhythm: Normal  rate.  Pulmonary:     Effort: Pulmonary effort is normal.     Breath sounds: No wheezing.  Abdominal:     General: Bowel sounds are normal.     Palpations: Abdomen is soft.  Skin:    General: Skin is warm and dry.     Capillary Refill: Capillary refill takes less than 2 seconds.  Neurological:     Mental Status: He is alert and oriented to person, place, and time.  Psychiatric:        Behavior: Behavior normal.        Thought Content: Thought content normal.        Judgment: Judgment normal.     Ortho Exam patient lacks about 5 to 8 degrees full extension.  This improves with passive stretching.  He has some weakness with quad extension work.  Medial joint line tenderness no lateral.  2+ knee effusion.  Specialty Comments:    No specialty comments available.  Imaging: No results found.   PMFS History: Patient Active Problem List   Diagnosis Date Noted  . Torn meniscus 07/20/2019  . Other meniscus derangements, posterior horn of medial meniscus, right knee 05/28/2019  . Visit for preventive health examination 08/12/2018  . Prostate cancer screening 08/12/2018  . Need for immunization against influenza 08/12/2018  . Right chronic serous otitis media 11/20/2017  . Testosterone insufficiency 10/23/2017  . Need for Tdap vaccination 10/23/2017  . Essential hypertension 05/12/2017  . Bilateral hearing loss 05/12/2017  . Eustachian tube dysfunction, bilateral 05/12/2017  . Celiac disease 05/12/2017  . Family history of colonic polyps 05/12/2017  . GAD (generalized anxiety disorder) 05/12/2017  . Pulmonary sarcoidosis (HCC) 12/16/2014  . Right inguinal hernia 04/20/2014   Past Medical History:  Diagnosis Date  . Anxiety   . Celiac disease   . Diverticulitis    per PET scan asymptomatic  . Hypertension    diet and exercise controlled.  Also has white coat syndrome.  Marland Kitchen Shortness of breath dyspnea    recently    Family History  Problem Relation Age of Onset  . Aneurysm Mother        brain  . Drug abuse Mother   . Rheum arthritis Father   . Prostate cancer Father 52  . Stroke Father 57       preceded bymultiple TIAs  . Hypertension Father   . Hearing loss Father   . Dementia Father   . Colon polyps Father        late 68s.  . Cancer Maternal Aunt        x2  . Hearing loss Brother   . Hypertension Brother   . Diabetes Daughter     Past Surgical History:  Procedure Laterality Date  . COLONOSCOPY    . HERNIA REPAIR Left 2000ish  . HERNIA REPAIR Left 2015  . INGUINAL HERNIA REPAIR Right 04/30/2014   Procedure: LAPAROSCOPIC RIGHT  INGUINAL HERNIA REPAIR ;  Surgeon: Shelly Rubenstein, MD;  Location: [facility];  Service: General;  Laterality: Right;  . INSERTION OF MESH N/A 04/30/2014    Procedure: INSERTION OF MESH;  Surgeon: Shelly Rubenstein, MD;  Location: [facility];  Service: General;  Laterality: N/A;  . KNEE ARTHROSCOPY W/ MENISCAL REPAIR Right   . MEDIASTINOSCOPY N/A 12/29/2014   Procedure: MEDIASTINOSCOPY;  Surgeon: Kerin Perna, MD;  Location: [facility] OR;  Service: Thoracic;  Laterality: N/A;  . RETINOPATHY SURGERY     Serous  . trauma surgery  punctured lung lacerated spleen   . VASECTOMY    . VIDEO BRONCHOSCOPY WITH ENDOBRONCHIAL ULTRASOUND N/A 12/29/2014   Procedure: VIDEO BRONCHOSCOPY WITH ENDOBRONCHIAL ULTRASOUND;  Surgeon: Kerin Perna, MD;  Location: [facility] OR;  Service: Thoracic;  Laterality: N/A;   Social History   Occupational History  . Occupation: Government social research officer  Tobacco Use  . Smoking status: Former Smoker    Packs/day: 0.25    Years: 1.00    Pack years: 0.25    Types: Cigarettes    Quit date: 09/04/1988    Years since quitting: 31.6  . Smokeless tobacco: Former Neurosurgeon    Types: Chew    Quit date: 09/04/1998  Vaping Use  . Vaping Use: Former  Substance and Sexual Activity  . Alcohol use: Yes    Alcohol/week: 2.0 standard drinks    Types: 2 Glasses of wine per week    Comment: once a month  . Drug use: No  . Sexual activity: Yes

## 2020-04-27 ENCOUNTER — Ambulatory Visit: Payer: PRIVATE HEALTH INSURANCE | Admitting: Orthopaedic Surgery

## 2020-07-02 ENCOUNTER — Ambulatory Visit: Payer: PRIVATE HEALTH INSURANCE

## 2020-07-09 ENCOUNTER — Other Ambulatory Visit: Payer: Self-pay | Admitting: Physician Assistant

## 2020-12-30 ENCOUNTER — Encounter: Payer: Self-pay | Admitting: Family Medicine

## 2020-12-30 ENCOUNTER — Ambulatory Visit (INDEPENDENT_AMBULATORY_CARE_PROVIDER_SITE_OTHER): Payer: PRIVATE HEALTH INSURANCE | Admitting: Family Medicine

## 2020-12-30 ENCOUNTER — Other Ambulatory Visit: Payer: Self-pay

## 2020-12-30 ENCOUNTER — Encounter: Payer: PRIVATE HEALTH INSURANCE | Admitting: Physician Assistant

## 2020-12-30 VITALS — BP 122/80 | HR 67 | Temp 98.1°F | Resp 18 | Ht 72.0 in | Wt 222.2 lb

## 2020-12-30 DIAGNOSIS — E669 Obesity, unspecified: Secondary | ICD-10-CM

## 2020-12-30 DIAGNOSIS — I10 Essential (primary) hypertension: Secondary | ICD-10-CM

## 2020-12-30 DIAGNOSIS — M25561 Pain in right knee: Secondary | ICD-10-CM | POA: Insufficient documentation

## 2020-12-30 DIAGNOSIS — D860 Sarcoidosis of lung: Secondary | ICD-10-CM | POA: Diagnosis not present

## 2020-12-30 DIAGNOSIS — G8929 Other chronic pain: Secondary | ICD-10-CM

## 2020-12-30 DIAGNOSIS — K1370 Unspecified lesions of oral mucosa: Secondary | ICD-10-CM

## 2020-12-30 LAB — HEPATIC FUNCTION PANEL
ALT: 44 U/L (ref 0–53)
AST: 31 U/L (ref 0–37)
Albumin: 4.6 g/dL (ref 3.5–5.2)
Alkaline Phosphatase: 85 U/L (ref 39–117)
Bilirubin, Direct: 0.1 mg/dL (ref 0.0–0.3)
Total Bilirubin: 0.7 mg/dL (ref 0.2–1.2)
Total Protein: 7.1 g/dL (ref 6.0–8.3)

## 2020-12-30 LAB — LIPID PANEL
Cholesterol: 191 mg/dL (ref 0–200)
HDL: 42.5 mg/dL (ref >39.00)
LDL Cholesterol: 121 mg/dL — ABNORMAL HIGH (ref 0–99)
NonHDL: 148.04
Total CHOL/HDL Ratio: 4
Triglycerides: 137 mg/dL (ref 0.0–149.0)
VLDL: 27.4 mg/dL (ref 0.0–40.0)

## 2020-12-30 LAB — BASIC METABOLIC PANEL
BUN: 16 mg/dL (ref 6–23)
CO2: 23 mEq/L (ref 19–32)
Calcium: 9.3 mg/dL (ref 8.4–10.5)
Chloride: 102 mEq/L (ref 96–112)
Creatinine, Ser: 0.77 mg/dL (ref 0.40–1.50)
GFR: 103.12 mL/min (ref >60.00)
Glucose, Bld: 67 mg/dL — ABNORMAL LOW (ref 70–99)
Potassium: 4.3 mEq/L (ref 3.5–5.1)
Sodium: 138 mEq/L (ref 135–145)

## 2020-12-30 LAB — CBC WITH DIFFERENTIAL/PLATELET
Basophils Absolute: 0 10*3/uL (ref 0.0–0.1)
Basophils Relative: 0.4 % (ref 0.0–3.0)
Eosinophils Absolute: 0.2 10*3/uL (ref 0.0–0.7)
Eosinophils Relative: 3.3 % (ref 0.0–5.0)
HCT: 43.7 % (ref 39.0–52.0)
Hemoglobin: 15.2 g/dL (ref 13.0–17.0)
Lymphocytes Relative: 25 % (ref 12.0–46.0)
Lymphs Abs: 1.5 10*3/uL (ref 0.7–4.0)
MCHC: 34.9 g/dL (ref 30.0–36.0)
MCV: 97.6 fl (ref 78.0–100.0)
Monocytes Absolute: 0.4 10*3/uL (ref 0.1–1.0)
Monocytes Relative: 6.2 % (ref 3.0–12.0)
Neutro Abs: 4 10*3/uL (ref 1.4–7.7)
Neutrophils Relative %: 65.1 % (ref 43.0–77.0)
Platelets: 278 10*3/uL (ref 150.0–400.0)
RBC: 4.48 Mil/uL (ref 4.22–5.81)
RDW: 12.5 % (ref 11.5–15.5)
WBC: 6.1 10*3/uL (ref 4.0–10.5)

## 2020-12-30 LAB — TSH: TSH: 3.04 u[IU]/mL (ref 0.35–4.50)

## 2020-12-30 MED ORDER — MELOXICAM 15 MG PO TABS: 15.0000 mg | ORAL_TABLET | Freq: Every day | ORAL | 3 refills | Status: DC

## 2020-12-30 NOTE — Progress Notes (Signed)
   Subjective:    Patient ID: Chase Mcclure, male    DOB: 03-13-1971, 50 y.o.   MRN: [id number]  HPI Pt here today to establish care.  HTN- chronic problem, on Olmesartan 20mg  daily.  No CP, SOB above baseline, HAs, visual changes, edema.  Obesity- pt has started .  Has been on program x3-4 weeks.  Doing well so far.  Walking regularly.  Sarcoidosis- pt has not been seen recently.  Was told that they could treat the sxs, not the disease and thankfully has been asymptomatic.  R knee- has seen Dr [place] in the past.  'my knee is constantly bothering me'.  Had torn meniscus, popliteal cyst, and arthritis.  Had arthroscopic surgery to 'clean it up' but no relief.  Pt has difficulty w/ prolonged sitting and then attempting to transition to movement.  Once moving, things improve.  Has also seen Dr Ophelia Charter who wanted to repeat MRI and pt wasn't interested at that time.  Pt reports pain is now interfering.    Blisters- pt has blister/ulcer inside R cheek.  Not painful.  Appeared 'very recently'.   Review of Systems For ROS see HPI   This visit occurred during the SARS-CoV-2 public health emergency.  Safety protocols were in place, including screening questions prior to the visit, additional usage of staff PPE, and extensive cleaning of exam room while observing appropriate contact time as indicated for disinfecting solutions.       Objective:   Physical Exam Vitals reviewed.  Constitutional:      General: He is not in acute distress.    Appearance: Normal appearance. He is well-developed.  HENT:     Head: Normocephalic and atraumatic.     Mouth/Throat:     Comments: Apparent venous lake inside R cheek Eyes:     Conjunctiva/sclera: Conjunctivae normal.     Pupils: Pupils are equal, round, and reactive to light.  Neck:     Thyroid: No thyromegaly.  Cardiovascular:     Rate and Rhythm: Normal rate and regular rhythm.     Pulses: Normal pulses.     Heart sounds: Normal heart  sounds. No murmur heard.   Pulmonary:     Effort: Pulmonary effort is normal. No respiratory distress.     Breath sounds: Normal breath sounds.  Abdominal:     General: Bowel sounds are normal. There is no distension.     Palpations: Abdomen is soft.  Musculoskeletal:     Cervical back: Normal range of motion and neck supple.     Right lower leg: No edema.     Left lower leg: No edema.  Lymphadenopathy:     Cervical: No cervical adenopathy.  Skin:    General: Skin is warm and dry.  Neurological:     Mental Status: He is alert and oriented to person, place, and time.     Cranial Nerves: No cranial nerve deficit.  Psychiatric:        Mood and Affect: Mood normal.        Behavior: Behavior normal.        Thought Content: Thought content normal.           Assessment & Plan:  Mouth lesion- new.  Appears to be a benign venous lake.  He has a dental appt upcoming and I encouraged him to get a 2nd opinion at that time.  Pt expressed understanding and is in agreement w/ plan.

## 2020-12-30 NOTE — Assessment & Plan Note (Signed)
Chronic problem but worsening.  Pain is now interfering with what he wants to do.  Will refer back to Dr Lequita Halt.  Script given for Meloxicam to take either daily or as needed.  Pt expressed understanding and is in agreement w/ plan.

## 2020-12-30 NOTE — Assessment & Plan Note (Signed)
Pt is currently asymptomatic.  Not following w/ Pulmonary.  Not interested in re-establishing at this time.  Pt is to let me know if symptoms develop

## 2020-12-30 NOTE — Patient Instructions (Addendum)
Schedule your complete physical in 6 months We'll notify you of your lab results and make any changes if needed Keep up the good work on healthy diet and regular exercise- you're doing great! If you have increased shortness of breath or other concerns, please let me know and we can refer to pulmonary We'll call you with your Ortho appt The area in your mouth appears to be a venous lake (benign) but have the dentist take a look Call with any questions or concerns Welcome!  We're glad to have you!!

## 2020-12-30 NOTE — Assessment & Plan Note (Signed)
Chronic problem.  On Olmesartan w/ good control.  Currently asymptomatic.  Check labs.  No anticipated med changes.  Will follow.

## 2020-12-30 NOTE — Assessment & Plan Note (Signed)
Pt's BMI is 30.14  He is walking regularly and recently started [place].  Applauded his efforts.  Will follow.

## 2021-03-02 ENCOUNTER — Encounter: Payer: Self-pay | Admitting: *Deleted

## 2021-08-18 ENCOUNTER — Encounter: Payer: PRIVATE HEALTH INSURANCE | Admitting: Family Medicine

## 2021-09-10 ENCOUNTER — Other Ambulatory Visit: Payer: Self-pay

## 2021-09-10 ENCOUNTER — Emergency Department (HOSPITAL_COMMUNITY)
Admission: EM | Admit: 2021-09-10 | Discharge: 2021-09-10 | Disposition: A | Discharge status: Home / Self Care | Payer: Managed Care, Other (non HMO) | Attending: Emergency Medicine | Admitting: Emergency Medicine

## 2021-09-10 ENCOUNTER — Emergency Department (HOSPITAL_COMMUNITY): Payer: Managed Care, Other (non HMO)

## 2021-09-10 ENCOUNTER — Encounter (HOSPITAL_COMMUNITY): Payer: Self-pay | Admitting: *Deleted

## 2021-09-10 DIAGNOSIS — R339 Retention of urine, unspecified: Secondary | ICD-10-CM | POA: Insufficient documentation

## 2021-09-10 DIAGNOSIS — Z20822 Contact with and (suspected) exposure to covid-19: Secondary | ICD-10-CM | POA: Diagnosis not present

## 2021-09-10 DIAGNOSIS — J101 Influenza due to other identified influenza virus with other respiratory manifestations: Secondary | ICD-10-CM

## 2021-09-10 DIAGNOSIS — R509 Fever, unspecified: Secondary | ICD-10-CM | POA: Diagnosis present

## 2021-09-10 LAB — CBC WITH DIFFERENTIAL/PLATELET
Abs Immature Granulocytes: 0.05 10*3/uL (ref 0.00–0.07)
Basophils Absolute: 0.1 10*3/uL (ref 0.0–0.1)
Basophils Relative: 0 %
Eosinophils Absolute: 0.1 10*3/uL (ref 0.0–0.5)
Eosinophils Relative: 1 %
HCT: 49.2 % (ref 39.0–52.0)
Hemoglobin: 17.4 g/dL — ABNORMAL HIGH (ref 13.0–17.0)
Immature Granulocytes: 0 %
Lymphocytes Relative: 14 %
Lymphs Abs: 2 10*3/uL (ref 0.7–4.0)
MCH: 34.5 pg — ABNORMAL HIGH (ref 26.0–34.0)
MCHC: 35.4 g/dL (ref 30.0–36.0)
MCV: 97.4 fL (ref 80.0–100.0)
Monocytes Absolute: 1.1 10*3/uL — ABNORMAL HIGH (ref 0.1–1.0)
Monocytes Relative: 8 %
Neutro Abs: 10.6 10*3/uL — ABNORMAL HIGH (ref 1.7–7.7)
Neutrophils Relative %: 77 %
Platelets: 223 10*3/uL (ref 150–400)
RBC: 5.05 MIL/uL (ref 4.22–5.81)
RDW: 12.4 % (ref 11.5–15.5)
WBC: 13.9 10*3/uL — ABNORMAL HIGH (ref 4.0–10.5)
nRBC: 0 % (ref 0.0–0.2)

## 2021-09-10 LAB — URINALYSIS, ROUTINE W REFLEX MICROSCOPIC
Bilirubin Urine: NEGATIVE
Glucose, UA: NEGATIVE mg/dL
Hgb urine dipstick: NEGATIVE
Ketones, ur: 5 mg/dL — AB
Leukocytes,Ua: NEGATIVE
Nitrite: NEGATIVE
Protein, ur: NEGATIVE mg/dL
Specific Gravity, Urine: 1.019 (ref 1.005–1.030)
pH: 5 (ref 5.0–8.0)

## 2021-09-10 LAB — COMPREHENSIVE METABOLIC PANEL
ALT: 41 U/L (ref 0–44)
AST: 33 U/L (ref 15–41)
Albumin: 4.7 g/dL (ref 3.5–5.0)
Alkaline Phosphatase: 60 U/L (ref 38–126)
Anion gap: 6 (ref 5–15)
BUN: 17 mg/dL (ref 6–20)
CO2: 26 mmol/L (ref 22–32)
Calcium: 8.7 mg/dL — ABNORMAL LOW (ref 8.9–10.3)
Chloride: 99 mmol/L (ref 98–111)
Creatinine, Ser: 1 mg/dL (ref 0.61–1.24)
GFR, Estimated: >60 mL/min (ref >60)
Glucose, Bld: 126 mg/dL — ABNORMAL HIGH (ref 70–99)
Potassium: 3.7 mmol/L (ref 3.5–5.1)
Sodium: 131 mmol/L — ABNORMAL LOW (ref 135–145)
Total Bilirubin: 1 mg/dL (ref 0.3–1.2)
Total Protein: 8.1 g/dL (ref 6.5–8.1)

## 2021-09-10 LAB — RESP PANEL BY RT-PCR (FLU A&B, COVID) ARPGX2
Influenza A by PCR: POSITIVE — AB
Influenza B by PCR: NEGATIVE
SARS Coronavirus 2 by RT PCR: NEGATIVE

## 2021-09-10 MED ORDER — IOHEXOL 350 MG/ML SOLN
100.0000 mL | Freq: Once | INTRAVENOUS | Status: AC | PRN
  Administered 2021-09-10: 100 mL via INTRAVENOUS

## 2021-09-10 MED ORDER — ONDANSETRON 4 MG PO TBDP: 4.0000 mg | ORAL_TABLET | Freq: Three times a day (TID) | ORAL | 0 refills | Status: DC | PRN

## 2021-09-10 MED ORDER — ONDANSETRON HCL 4 MG/2ML IJ SOLN
4.0000 mg | Freq: Once | INTRAMUSCULAR | Status: AC
Start: 2021-09-10 — End: 2021-09-10
  Administered 2021-09-10: 4 mg via INTRAVENOUS
  Filled 2021-09-10: qty 2

## 2021-09-10 MED ORDER — LACTATED RINGERS IV BOLUS
1000.0000 mL | Freq: Once | INTRAVENOUS | Status: AC
  Administered 2021-09-10: 1000 mL via INTRAVENOUS

## 2021-09-10 MED ORDER — ACETAMINOPHEN 325 MG PO TABS
650.0000 mg | ORAL_TABLET | Freq: Once | ORAL | Status: AC
  Administered 2021-09-10: 650 mg via ORAL
  Filled 2021-09-10: qty 2

## 2021-09-10 NOTE — ED Provider Notes (Signed)
[place] DEPT Provider Note   CSN: [id number] Arrival date & time: 09/10/21  1422     History  Chief Complaint  Patient presents with   Urinary Retention   Fever   Generalized Body Aches    Chase Mcclure is a 51 y.o. male.   Fever Associated symptoms: chills, diarrhea, dysuria, myalgias, nausea and vomiting   Associated symptoms: no chest pain, no confusion, no congestion, no cough, no ear pain, no headaches, no rash and no sore throat   Patient presenting for 3 days of fevers and myalgias.  Additionally, he experienced acute on chronic difficulty urinating today as well as a worsening right lower quadrant pain.  He has had ongoing difficulty with urination for weeks.  He did see his urologist on Wednesday for this.  He has been seeing [facility] for years.  He is unsure of why he started seeing them.  Following this appointment on Wednesday is when he developed flulike symptoms.  In addition to his fevers myalgias, he has had nausea, vomiting, very poor p.o. tolerance, and diarrhea over the past 4 days.  He also states that he has not had a slight right lower quadrant pain over the past several weeks that had acutely worsened today.  Area is very painful and tender to palpation.  While in the ED waiting room, he did take Advil.  He was able to urinate a small amount.  He does endorse some mild dysuria with urination.  Currently, he has mild nausea and continued right lower quadrant pain.  He has remote history of ex lap procedure as a child following a crushing injury.  He does believe that he still has all his organs.    Home Medications Prior to Admission medications   Medication Sig Start Date End Date Taking? Authorizing Provider  ondansetron (ZOFRAN-ODT) 4 MG disintegrating tablet Take 1 tablet (4 mg total) by mouth every 8 (eight) hours as needed for nausea or vomiting. 09/10/21  Yes Godfrey Pick, MD  cetirizine (ZYRTEC) 10 MG tablet Take 10  mg by mouth daily.    [provider]  FLUoxetine (PROZAC) 40 MG capsule TAKE 1 CAPSULE BY MOUTH EVERY DAY 04/16/20   Brunetta Jeans, PA-C  meloxicam (MOBIC) 15 MG tablet Take 1 tablet (15 mg total) by mouth daily. 12/30/20   Midge Minium, MD  olmesartan (BENICAR) 20 MG tablet TAKE 1 TABLET BY MOUTH EVERY DAY 07/09/20   Brunetta Jeans, PA-C  vitamin C (ASCORBIC ACID) 500 MG tablet Take 500 mg by mouth daily.    [provider]  Vitamin D, Ergocalciferol, 2000 units CAPS Take 2 capsules by mouth daily. 10/25/17   Brunetta Jeans, PA-C  zinc gluconate 50 MG tablet Take 50 mg by mouth daily.    [provider]      Allergies    Gluten meal    Review of Systems   Review of Systems  Constitutional:  Positive for appetite change, chills and fever. Negative for activity change and diaphoresis.  HENT:  Negative for congestion, ear pain, sore throat and trouble swallowing.   Eyes:  Negative for pain and visual disturbance.  Respiratory:  Negative for cough and shortness of breath.   Cardiovascular:  Negative for chest pain and palpitations.  Gastrointestinal:  Positive for abdominal pain, diarrhea, nausea and vomiting.  Genitourinary:  Positive for difficulty urinating and dysuria. Negative for flank pain, hematuria, penile pain and testicular pain.  Musculoskeletal:  Positive  for myalgias. Negative for arthralgias, back pain, gait problem and neck pain.  Skin:  Negative for color change and rash.  Neurological:  Negative for dizziness, seizures, syncope, weakness, light-headedness, numbness and headaches.  Hematological:  Does not bruise/bleed easily.  Psychiatric/Behavioral:  Negative for confusion and decreased concentration.   All other systems reviewed and are negative.  Physical Exam Updated Vital Signs BP 122/74 (BP Location: Left Arm)    Pulse 98    Temp 100.1 F (37.8 C) (Oral)    Resp 20    Ht 6' (1.829 m)    Wt 102.1 kg    SpO2 95%    BMI 30.52  kg/m  Physical Exam Vitals and nursing note reviewed.  Constitutional:      General: He is not in acute distress.    Appearance: Normal appearance. He is well-developed and normal weight. He is not ill-appearing, toxic-appearing or diaphoretic.  HENT:     Head: Normocephalic and atraumatic.     Right Ear: External ear normal.     Left Ear: External ear normal.     Nose: Nose normal.     Mouth/Throat:     Mouth: Mucous membranes are moist.     Pharynx: Oropharynx is clear.  Eyes:     General: No scleral icterus.    Extraocular Movements: Extraocular movements intact.     Conjunctiva/sclera: Conjunctivae normal.  Cardiovascular:     Rate and Rhythm: Normal rate and regular rhythm.     Heart sounds: No murmur heard. Pulmonary:     Effort: Pulmonary effort is normal. No respiratory distress.     Breath sounds: Normal breath sounds. No wheezing or rales.  Abdominal:     Palpations: Abdomen is soft.     Tenderness: There is abdominal tenderness (RLQ).  Musculoskeletal:        General: No swelling. Normal range of motion.     Cervical back: Normal range of motion and neck supple. No rigidity.     Right lower leg: No edema.     Left lower leg: No edema.  Skin:    General: Skin is warm and dry.     Capillary Refill: Capillary refill takes less than 2 seconds.     Coloration: Skin is not jaundiced or pale.  Neurological:     General: No focal deficit present.     Mental Status: He is alert and oriented to person, place, and time.     Cranial Nerves: No cranial nerve deficit.     Sensory: No sensory deficit.     Motor: No weakness.     Coordination: Coordination normal.  Psychiatric:        Mood and Affect: Mood normal.        Behavior: Behavior normal.        Thought Content: Thought content normal.        Judgment: Judgment normal.    ED Results / Procedures / Treatments   Labs (all labs ordered are listed, but only abnormal results are displayed) Labs Reviewed  RESP  PANEL BY RT-PCR (FLU A&B, COVID) ARPGX2 - Abnormal; Notable for the following components:      Result Value   Influenza A by PCR POSITIVE (*)    All other components within normal limits  CBC WITH DIFFERENTIAL/PLATELET - Abnormal; Notable for the following components:   WBC 13.9 (*)    Hemoglobin 17.4 (*)    MCH 34.5 (*)    Neutro Abs 10.6 (*)  Monocytes Absolute 1.1 (*)    All other components within normal limits  COMPREHENSIVE METABOLIC PANEL - Abnormal; Notable for the following components:   Sodium 131 (*)    Glucose, Bld 126 (*)    Calcium 8.7 (*)    All other components within normal limits  URINALYSIS, ROUTINE W REFLEX MICROSCOPIC - Abnormal; Notable for the following components:   Ketones, ur 5 (*)    All other components within normal limits    EKG None  Radiology CT ABDOMEN PELVIS W CONTRAST  Result Date: 09/10/2021 CLINICAL DATA:  Right lower quadrant pain. Intermittent pain for a few weeks. Patient reports urinary retention. Fever. EXAM: CT ABDOMEN AND PELVIS WITH CONTRAST TECHNIQUE: Multidetector CT imaging of the abdomen and pelvis was performed using the standard protocol following bolus administration of intravenous contrast. CONTRAST:  167mL OMNIPAQUE IOHEXOL 350 MG/ML SOLN COMPARISON:  PET CT 12/25/2014 FINDINGS: Lower chest: Hypoventilatory changes in the dependent lungs. No pleural fluid. The heart is normal in size. Hepatobiliary: No focal liver abnormality is seen. The gallbladder is decompressed. No gallstones, gallbladder wall thickening, or biliary dilatation. Pancreas: Truncated appearance of the pancreatic body. Normal appearance of the head and uncinate process. Spleen: Normal in size without focal abnormality. Splenules at the hilum. Adrenals/Urinary Tract: Normal adrenal glands. No hydronephrosis or perinephric edema. Homogeneous renal enhancement with symmetric excretion on delayed phase imaging. There is a 12 mm low-density lesion in the posterior upper  left kidney that is too small to characterize. There is also a tiny cyst in the lower right kidney. Urinary bladder is partially distended. Wall thickening of the anterior bladder dome with adjacent fat stranding. Stomach/Bowel: Stomach distended with enteric contrast and ingested contents. No gastric wall thickening. Normal small bowel without obstruction or inflammation. Normal appendix. Sigmoid colonic diverticulosis. No diverticulitis or acute colonic inflammation. Vascular/Lymphatic: Normal caliber abdominal aorta with mild atherosclerosis. Patent portal and splenic veins. The previous periportal lymph nodes have decreased in size. No enlarged lymph nodes in the abdomen and pelvis. Reproductive: Prostate is unremarkable. Other: Prior right inguinal hernia repair without evidence of recurrent hernia. No ascites. No free air. Tiny fat containing umbilical hernia. Musculoskeletal: Scattered Schmorl's nodes in the spine. There are no acute or suspicious osseous abnormalities. Scattered bone islands in the pelvis. IMPRESSION: 1. Wall thickening of the anterior bladder dome with adjacent fat stranding, suspicious for cystitis. Recommend correlation with urinalysis. 2. No other acute abnormality in the abdomen/pelvis. Normal appendix. 3. Colonic diverticulosis without diverticulitis. Aortic Atherosclerosis (ICD10-I70.0). Electronically Signed   By: Keith Rake M.D.   On: 09/10/2021 18:45    Procedures Procedures    Medications Ordered in ED Medications  lactated ringers bolus 1,000 mL (0 mLs Intravenous Stopped 09/10/21 1914)  ondansetron (ZOFRAN) injection 4 mg (4 mg Intravenous Given 09/10/21 1758)  iohexol (OMNIPAQUE) 350 MG/ML injection 100 mL (100 mLs Intravenous Contrast Given 09/10/21 1807)  acetaminophen (TYLENOL) tablet 650 mg (650 mg Oral Given 09/10/21 2043)    ED Course/ Medical Decision Making/ A&P                           Medical Decision Making  This patient presents to the ED for  concern of fevers, difficulty urinating, and right lower quadrant abdominal pain, this involves an extensive number of treatment options, and is a complaint that carries with it a high risk of complications and morbidity.  The differential diagnosis includes urinary retention, UTI, kidney stone, appendicitis,  intra-abdominal abscess, diverticulitis, gastroenteritis, COVID-19, influenza.   Co morbidities that complicate the patient evaluation  History of obesity, inguinal hernias, generalized anxiety, pulmonary sarcoidosis, HTN, previous diverticulitis, celiac disease.   Additional history obtained:  Additional history obtained from patient's wife External records from outside source obtained and reviewed including EMR: Patient has a previous diagnosis of sarcoidosis which has been asymptomatic.  He does not require maintenance medication for this.  He has been seen by urology for chronic difficulty with urination.  He was seen by them recently for this reason as well as for erectile dysfunction.   Lab Tests:  I Ordered, and personally interpreted labs.  The pertinent results include: No evidence of urinary infection or hematuria.  CBC shows a leukocytosis, consistent with concentration,  respiratory panel shows influenza A.   Imaging Studies ordered:  I ordered imaging studies including CT of abdomen pelvis, given his right lower quadrant pain and tenderness. I independently visualized and interpreted imaging which showed no evidence of appendicitis, diverticulitis, or other bowel pathology.  Anterior dome of bladder is thickened.  Prostate gland was unremarkable, however, patient does endorse symptoms of BPH with chronic decreased urine stream.  Difficulty with urination could be because of thickened bladder dome wall.  Urinalysis is not consistent with cystitis. I agree with the radiologist interpretation   Cardiac Monitoring:  The patient was maintained on a cardiac monitor.  I  personally viewed and interpreted the cardiac monitored which showed an underlying rhythm of: Sinus rhythm.   Medicines ordered and prescription drug management:  I ordered medication including Tylenol and IV fluids for fever and tachycardia. Reevaluation of the patient after these medicines showed that the patient improved I have reviewed the patients home medicines and have made adjustments as needed  Critical Interventions:  Antiemetic, antipyresis, and IV fluids    Problem List / ED Course:  Patient presents for 4 days of febrile illness.  He did take Advil prior to being bedded in the ED.  On initial assessment, patient is well-appearing.  Source of fever is identified as influenza A infection.  Additionally, patient has had right lower quadrant pain.  Tenderness is present on exam.  Patient was offered pain medication for this but declined.  CT scan of abdomen pelvis was ordered which did not show any findings in this area to explain his pain and tenderness.  Patient was given reassurance regarding his right lower quadrant pain.  He also had concern of UTI and/your urinary retention.  I initially did bladder scan with bedside ultrasound which showed approximate bladder volume of 240.  At the time of CT scan, bladder volume was slightly increased in the range of 350.  Following CT scan, was given IV fluids.  He was able to urinate normal volumes of urine.  He no longer felt that it was difficult to urinate.  I suspect his decreased urine was simply secondary to dehydration.  UA was not consistent with urine infection.  Patient was informed of this result.  Given no other infectious source other than influenza A, no antibiotics are indicated at this time.  Given that he is on day 4-5 of flu symptoms, in addition to the diarrhea that he is already having, I feel that patient would not benefit from Tamiflu.  He was advised to continue supportive care at home and to return to the ED for any  worsening symptoms.   Reevaluation:  After the interventions noted above, I reevaluated the patient and found that they  have :improved   Social Determinants of Health:  Patient has strong family support at home and does appear to have good outpatient follow-up.   Dispostion:  After consideration of the diagnostic results and the patients response to treatment, I feel that the patent would benefit from discharge home with outpatient follow-up and return to the ED as needed for worsening symptoms..          Final Clinical Impression(s) / ED Diagnoses Final diagnoses:  Influenza A    Rx / DC Orders ED Discharge Orders          Ordered    ondansetron (ZOFRAN-ODT) 4 MG disintegrating tablet  Every 8 hours PRN        09/10/21 2031              Godfrey Pick, MD 09/12/21 [phone]

## 2021-09-10 NOTE — ED Triage Notes (Signed)
Urinary retention since this morning, is followed by urology.  3 days of fever, body aches

## 2021-09-10 NOTE — ED Provider Triage Note (Signed)
Emergency Medicine Provider Triage Evaluation Note  Chase Mcclure , a 51 y.o. male  was evaluated in triage.  Pt complains of RLQ abdominal pain "off and on for a few weeks now". Patient states that this pain is confined to his suprapubic/RLQ area. Patient states he has had a fever and inability to eat foods without throwing them up for the same amount of time. Patient has been seen for this issue at his urologist.  Review of Systems  Positive: RLQ pain, fevers, nausea Negative: Back pain, CP, SOB  Physical Exam  BP (!) 143/89 (BP Location: Right Arm)    Pulse 97    Temp 98.5 F (36.9 C) (Oral)    Resp 17    Ht 6' (1.829 m)    Wt 102.1 kg    SpO2 94%    BMI 30.52 kg/m  Gen:   Awake, no distress   Resp:  Normal effort  MSK:   Moves extremities without difficulty  Other:  No CVA tenderness. Patient has RLQ tenderness  Medical Decision Making  Medically screening exam initiated at 2:58 PM.  Appropriate orders placed.  Chase Mcclure was informed that the remainder of the evaluation will be completed by another provider, this initial triage assessment does not replace that evaluation, and the importance of remaining in the ED until their evaluation is complete.     Chase Decant, PA-C 09/10/21 1500

## 2021-10-04 ENCOUNTER — Ambulatory Visit: Payer: Managed Care, Other (non HMO) | Admitting: Internal Medicine

## 2021-10-04 ENCOUNTER — Encounter: Payer: Self-pay | Admitting: Internal Medicine

## 2021-10-04 ENCOUNTER — Ambulatory Visit (INDEPENDENT_AMBULATORY_CARE_PROVIDER_SITE_OTHER): Payer: Managed Care, Other (non HMO)

## 2021-10-04 ENCOUNTER — Other Ambulatory Visit: Payer: Self-pay

## 2021-10-04 VITALS — BP 126/86 | HR 84 | Temp 98.4°F | Resp 16 | Ht 72.0 in | Wt 238.0 lb

## 2021-10-04 DIAGNOSIS — E039 Hypothyroidism, unspecified: Secondary | ICD-10-CM

## 2021-10-04 DIAGNOSIS — Z0000 Encounter for general adult medical examination without abnormal findings: Secondary | ICD-10-CM | POA: Diagnosis not present

## 2021-10-04 DIAGNOSIS — R052 Subacute cough: Secondary | ICD-10-CM | POA: Insufficient documentation

## 2021-10-04 DIAGNOSIS — R739 Hyperglycemia, unspecified: Secondary | ICD-10-CM | POA: Diagnosis not present

## 2021-10-04 DIAGNOSIS — I10 Essential (primary) hypertension: Secondary | ICD-10-CM | POA: Diagnosis not present

## 2021-10-04 DIAGNOSIS — Z23 Encounter for immunization: Secondary | ICD-10-CM

## 2021-10-04 DIAGNOSIS — D860 Sarcoidosis of lung: Secondary | ICD-10-CM | POA: Diagnosis not present

## 2021-10-04 DIAGNOSIS — R0609 Other forms of dyspnea: Secondary | ICD-10-CM | POA: Insufficient documentation

## 2021-10-04 DIAGNOSIS — R7989 Other specified abnormal findings of blood chemistry: Secondary | ICD-10-CM | POA: Insufficient documentation

## 2021-10-04 DIAGNOSIS — Z0001 Encounter for general adult medical examination with abnormal findings: Secondary | ICD-10-CM

## 2021-10-04 LAB — BASIC METABOLIC PANEL
BUN: 9 mg/dL (ref 6–23)
CO2: 31 mEq/L (ref 19–32)
Calcium: 9.6 mg/dL (ref 8.4–10.5)
Chloride: 102 mEq/L (ref 96–112)
Creatinine, Ser: 0.86 mg/dL (ref 0.40–1.50)
GFR: 99.2 mL/min (ref >60.00)
Glucose, Bld: 111 mg/dL — ABNORMAL HIGH (ref 70–99)
Potassium: 4.1 mEq/L (ref 3.5–5.1)
Sodium: 139 mEq/L (ref 135–145)

## 2021-10-04 LAB — CBC WITH DIFFERENTIAL/PLATELET
Basophils Absolute: 0 10*3/uL (ref 0.0–0.1)
Basophils Relative: 0.5 % (ref 0.0–3.0)
Eosinophils Absolute: 0.3 10*3/uL (ref 0.0–0.7)
Eosinophils Relative: 3.7 % (ref 0.0–5.0)
HCT: 45.9 % (ref 39.0–52.0)
Hemoglobin: 15.8 g/dL (ref 13.0–17.0)
Lymphocytes Relative: 23.2 % (ref 12.0–46.0)
Lymphs Abs: 1.9 10*3/uL (ref 0.7–4.0)
MCHC: 34.3 g/dL (ref 30.0–36.0)
MCV: 98.9 fl (ref 78.0–100.0)
Monocytes Absolute: 0.6 10*3/uL (ref 0.1–1.0)
Monocytes Relative: 7.3 % (ref 3.0–12.0)
Neutro Abs: 5.3 10*3/uL (ref 1.4–7.7)
Neutrophils Relative %: 65.3 % (ref 43.0–77.0)
Platelets: 278 10*3/uL (ref 150.0–400.0)
RBC: 4.64 Mil/uL (ref 4.22–5.81)
RDW: 12.7 % (ref 11.5–15.5)
WBC: 8.2 10*3/uL (ref 4.0–10.5)

## 2021-10-04 LAB — HEPATIC FUNCTION PANEL
ALT: 34 U/L (ref 0–53)
AST: 23 U/L (ref 0–37)
Albumin: 4.4 g/dL (ref 3.5–5.2)
Alkaline Phosphatase: 75 U/L (ref 39–117)
Bilirubin, Direct: 0.1 mg/dL (ref 0.0–0.3)
Total Bilirubin: 0.9 mg/dL (ref 0.2–1.2)
Total Protein: 7.2 g/dL (ref 6.0–8.3)

## 2021-10-04 LAB — PSA: PSA: 1.04 ng/mL (ref 0.10–4.00)

## 2021-10-04 LAB — LIPID PANEL
Cholesterol: 215 mg/dL — ABNORMAL HIGH (ref 0–200)
HDL: 36.5 mg/dL — ABNORMAL LOW (ref >39.00)
NonHDL: 178.01
Total CHOL/HDL Ratio: 6
Triglycerides: 271 mg/dL — ABNORMAL HIGH (ref 0.0–149.0)
VLDL: 54.2 mg/dL — ABNORMAL HIGH (ref 0.0–40.0)

## 2021-10-04 LAB — LDL CHOLESTEROL, DIRECT: Direct LDL: 146 mg/dL

## 2021-10-04 LAB — C-REACTIVE PROTEIN: CRP: <1 mg/dL (ref 0.5–20.0)

## 2021-10-04 LAB — HEMOGLOBIN A1C: Hgb A1c MFr Bld: 5.1 % (ref 4.6–6.5)

## 2021-10-04 LAB — TSH: TSH: 5.75 u[IU]/mL — ABNORMAL HIGH (ref 0.35–5.50)

## 2021-10-04 NOTE — Progress Notes (Signed)
Subjective:  Patient ID: Chase Mcclure, male    DOB: 08-Dec-1970  Age: 51 y.o. MRN: [id number]  CC: Annual Exam and Hypertension  This visit occurred during the SARS-CoV-2 public health emergency.  Safety protocols were in place, including screening questions prior to the visit, additional usage of staff PPE, and extensive cleaning of exam room while observing appropriate contact time as indicated for disinfecting solutions.    HPI Chase Mcclure presents for a CPX and to establish -   He has been seeing a urologist over the last 6 months for symptomatic BPH.  He tells me his DRE was done about a month ago and was normal.  He is getting some symptom relief with Cialis.  He had COVID about 2 months ago and influenza about a month ago.  He continues to complain of a nonproductive cough and DOE.  He denies chest pain, diaphoresis, dizziness, lightheadedness, or edema.  I looked up symptoms of hypothyroidism and I pretty much have all of them. Tiredness Weight gain Slow movements and thoughts Muscle aches and weakness  Dry skin Brittle hair  Loss of libido Tingling in fingers   Outpatient Medications Prior to Visit  Medication Sig Dispense Refill   cetirizine (ZYRTEC) 10 MG tablet Take 10 mg by mouth daily.     olmesartan (BENICAR) 20 MG tablet TAKE 1 TABLET BY MOUTH EVERY DAY 90 tablet 1   vitamin C (ASCORBIC ACID) 500 MG tablet Take 500 mg by mouth daily.     Vitamin D, Ergocalciferol, 2000 units CAPS Take 2 capsules by mouth daily. 30 capsule 0   zinc gluconate 50 MG tablet Take 50 mg by mouth daily.     FLUoxetine (PROZAC) 40 MG capsule TAKE 1 CAPSULE BY MOUTH EVERY DAY 90 capsule 1   meloxicam (MOBIC) 15 MG tablet Take 1 tablet (15 mg total) by mouth daily. 30 tablet 3   ondansetron (ZOFRAN-ODT) 4 MG disintegrating tablet Take 1 tablet (4 mg total) by mouth every 8 (eight) hours as needed for nausea or vomiting. 15 tablet 0   No facility-administered medications prior to  visit.    ROS Review of Systems  Constitutional:  Positive for fatigue and unexpected weight change (wt gain). Negative for chills, diaphoresis and fever.  HENT: Negative.    Eyes: Negative.  Negative for visual disturbance.  Respiratory:  Positive for cough and shortness of breath. Negative for chest tightness and wheezing.   Cardiovascular:  Negative for chest pain, palpitations and leg swelling.  Gastrointestinal:  Negative for abdominal pain, constipation, diarrhea and vomiting.  Endocrine: Negative.   Genitourinary:  Positive for difficulty urinating. Negative for dysuria and hematuria.  Musculoskeletal:  Positive for arthralgias and myalgias.  Skin: Negative.  Negative for color change and rash.  Neurological:  Negative for dizziness, weakness, light-headedness, numbness and headaches.  Hematological:  Negative for adenopathy. Does not bruise/bleed easily.  Psychiatric/Behavioral: Negative.     Objective:  BP 126/86 (BP Location: Right Arm, Patient Position: Sitting, Cuff Size: Large)    Pulse 84    Temp 98.4 F (36.9 C) (Oral)    Resp 16    Ht 6' (1.829 m)    Wt 238 lb (108 kg)    SpO2 98%    BMI 32.28 kg/m   BP Readings from Last 3 Encounters:  10/04/21 126/86  09/10/21 122/74  12/30/20 122/80    Wt Readings from Last 3 Encounters:  10/04/21 238 lb (108 kg)  09/10/21 225 lb (102.1  kg)  12/30/20 222 lb 3.2 oz (100.8 kg)    Physical Exam Vitals reviewed.  Constitutional:      Appearance: He is obese.  HENT:     Nose: Nose normal.     Mouth/Throat:     Mouth: Mucous membranes are moist.  Eyes:     General: No scleral icterus.    Conjunctiva/sclera: Conjunctivae normal.  Cardiovascular:     Rate and Rhythm: Normal rate and regular rhythm.     Heart sounds: Normal heart sounds, S1 normal and S2 normal. No murmur heard.   No gallop.     Comments: EKG- NSR, 89 bpm No LVH Normal EKG Pulmonary:     Effort: Pulmonary effort is normal.     Breath sounds: No  stridor. No wheezing, rhonchi or rales.  Abdominal:     General: Abdomen is flat.     Palpations: There is no mass.     Tenderness: There is no abdominal tenderness. There is no guarding.     Hernia: No hernia is present.  Musculoskeletal:        General: Normal range of motion.     Cervical back: Neck supple.     Right lower leg: No edema.     Left lower leg: No edema.  Lymphadenopathy:     Cervical: No cervical adenopathy.  Skin:    General: Skin is warm and dry.     Findings: No lesion.  Neurological:     General: No focal deficit present.     Mental Status: He is alert. Mental status is at baseline.    Lab Results  Component Value Date   WBC 8.2 10/04/2021   HGB 15.8 10/04/2021   HCT 45.9 10/04/2021   PLT 278.0 10/04/2021   GLUCOSE 111 (H) 10/04/2021   CHOL 215 (H) 10/04/2021   TRIG 271.0 (H) 10/04/2021   HDL 36.50 (L) 10/04/2021   LDLDIRECT 146.0 10/04/2021   LDLCALC 121 (H) 12/30/2020   ALT 34 10/04/2021   AST 23 10/04/2021   NA 139 10/04/2021   K 4.1 10/04/2021   CL 102 10/04/2021   CREATININE 0.86 10/04/2021   BUN 9 10/04/2021   CO2 31 10/04/2021   TSH 5.38 (H) 10/06/2021   PSA 1.04 10/04/2021   INR 1.11 12/29/2014   HGBA1C 5.1 10/04/2021    CT ABDOMEN PELVIS W CONTRAST  Result Date: 09/10/2021 CLINICAL DATA:  Right lower quadrant pain. Intermittent pain for a few weeks. Patient reports urinary retention. Fever. EXAM: CT ABDOMEN AND PELVIS WITH CONTRAST TECHNIQUE: Multidetector CT imaging of the abdomen and pelvis was performed using the standard protocol following bolus administration of intravenous contrast. CONTRAST:  13mL OMNIPAQUE IOHEXOL 350 MG/ML SOLN COMPARISON:  PET CT 12/25/2014 FINDINGS: Lower chest: Hypoventilatory changes in the dependent lungs. No pleural fluid. The heart is normal in size. Hepatobiliary: No focal liver abnormality is seen. The gallbladder is decompressed. No gallstones, gallbladder wall thickening, or biliary dilatation.  Pancreas: Truncated appearance of the pancreatic body. Normal appearance of the head and uncinate process. Spleen: Normal in size without focal abnormality. Splenules at the hilum. Adrenals/Urinary Tract: Normal adrenal glands. No hydronephrosis or perinephric edema. Homogeneous renal enhancement with symmetric excretion on delayed phase imaging. There is a 12 mm low-density lesion in the posterior upper left kidney that is too small to characterize. There is also a tiny cyst in the lower right kidney. Urinary bladder is partially distended. Wall thickening of the anterior bladder dome with adjacent fat stranding. Stomach/Bowel:  Stomach distended with enteric contrast and ingested contents. No gastric wall thickening. Normal small bowel without obstruction or inflammation. Normal appendix. Sigmoid colonic diverticulosis. No diverticulitis or acute colonic inflammation. Vascular/Lymphatic: Normal caliber abdominal aorta with mild atherosclerosis. Patent portal and splenic veins. The previous periportal lymph nodes have decreased in size. No enlarged lymph nodes in the abdomen and pelvis. Reproductive: Prostate is unremarkable. Other: Prior right inguinal hernia repair without evidence of recurrent hernia. No ascites. No free air. Tiny fat containing umbilical hernia. Musculoskeletal: Scattered Schmorl's nodes in the spine. There are no acute or suspicious osseous abnormalities. Scattered bone islands in the pelvis. IMPRESSION: 1. Wall thickening of the anterior bladder dome with adjacent fat stranding, suspicious for cystitis. Recommend correlation with urinalysis. 2. No other acute abnormality in the abdomen/pelvis. Normal appendix. 3. Colonic diverticulosis without diverticulitis. Aortic Atherosclerosis (ICD10-I70.0). Electronically Signed   By: Keith Rake M.D.   On: 09/10/2021 18:45     Assessment & Plan:   Yu was seen today for annual exam and hypertension.  Diagnoses and all orders for this  visit:  Chronic hyperglycemia-his A1c is normal. -     Basic metabolic panel; Future -     Hemoglobin A1c; Future -     Hemoglobin A1c -     Basic metabolic panel  Encounter for general adult medical examination with abnormal findings- Exam completed, labs reviewed; statin tx is not indicated, vaccines reviewed; he refused a flu vaccine, cancer screenings are up-to-date patient education material was given., -     Lipid panel; Future -     PSA; Future -     PSA -     Lipid panel  Pulmonary sarcoidosis ([place])- Based on his chest x-ray and labs there is no evidence of disease at this time. -     Basic metabolic panel; Future -     C-reactive protein; Future -     CBC with Differential/Platelet; Future -     Hepatic function panel; Future -     DG Chest 2 View; Future -     Hepatic function panel -     CBC with Differential/Platelet -     C-reactive protein -     Basic metabolic panel  Essential hypertension- His BP is well controlled. EKG is normal. -     Basic metabolic panel; Future -     TSH; Future -     CBC with Differential/Platelet; Future -     Hepatic function panel; Future -     EKG 12-Lead -     Hepatic function panel -     CBC with Differential/Platelet -     TSH -     Basic metabolic panel  Subacute cough- His chest x-ray is negative for mass or infiltrate. -     DG Chest 2 View; Future  DOE (dyspnea on exertion)- Will screen for coronary atherosclerosis. -     CT CARDIAC SCORING (SELF PAY ONLY); Future  TSH elevation -     Thyroid peroxidase antibody; Future -     Thyroid Panel With TSH; Future  Acquired hypothyroidism -     levothyroxine (SYNTHROID) 25 MCG tablet; Take 1 tablet (25 mcg total) by mouth daily before breakfast.  Other orders -     Varicella-zoster vaccine IM (Shingrix) -     LDL cholesterol, direct   I have discontinued [facility] John L. Dechellis "Joey"'s FLUoxetine, meloxicam, and ondansetron. I am also having him start on levothyroxine.  Additionally, I am  having him maintain his Vitamin D (Ergocalciferol), cetirizine, olmesartan, vitamin C, and zinc gluconate.  Meds ordered this encounter  Medications   levothyroxine (SYNTHROID) 25 MCG tablet    Sig: Take 1 tablet (25 mcg total) by mouth daily before breakfast.    Dispense:  90 tablet    Refill:  0     Follow-up: Return in about 3 months (around 01/01/2022).  Scarlette Calico, MD

## 2021-10-04 NOTE — Patient Instructions (Signed)

## 2021-10-05 DIAGNOSIS — E039 Hypothyroidism, unspecified: Secondary | ICD-10-CM | POA: Insufficient documentation

## 2021-10-05 MED ORDER — LEVOTHYROXINE SODIUM 25 MCG PO TABS: 25.0000 ug | ORAL_TABLET | Freq: Every day | ORAL | 0 refills | Status: DC

## 2021-10-06 ENCOUNTER — Other Ambulatory Visit: Payer: Managed Care, Other (non HMO)

## 2021-10-06 ENCOUNTER — Other Ambulatory Visit: Payer: Self-pay

## 2021-10-06 DIAGNOSIS — R7989 Other specified abnormal findings of blood chemistry: Secondary | ICD-10-CM

## 2021-10-07 LAB — THYROID PANEL WITH TSH
Free Thyroxine Index: 2 (ref 1.4–3.8)
T3 Uptake: 31 % (ref 22–35)
T4, Total: 6.3 ug/dL (ref 4.9–10.5)
TSH: 5.38 mIU/L — ABNORMAL HIGH (ref 0.40–4.50)

## 2021-10-07 LAB — THYROID PEROXIDASE ANTIBODY: Thyroperoxidase Ab SerPl-aCnc: 3 IU/mL (ref <9)

## 2021-10-08 ENCOUNTER — Encounter: Payer: Self-pay | Admitting: Internal Medicine

## 2021-10-18 ENCOUNTER — Encounter: Payer: Self-pay | Admitting: Internal Medicine

## 2021-10-18 MED ORDER — OLMESARTAN MEDOXOMIL 20 MG PO TABS: 20.0000 mg | ORAL_TABLET | Freq: Every day | ORAL | 1 refills | Status: DC

## 2021-12-09 ENCOUNTER — Ambulatory Visit (INDEPENDENT_AMBULATORY_CARE_PROVIDER_SITE_OTHER)
Admission: RE | Admit: 2021-12-09 | Discharge: 2021-12-09 | Disposition: A | Discharge status: Home / Self Care | Payer: Self-pay | Source: Ambulatory Visit | Attending: Internal Medicine | Admitting: Internal Medicine

## 2021-12-09 DIAGNOSIS — R0609 Other forms of dyspnea: Secondary | ICD-10-CM

## 2021-12-17 ENCOUNTER — Encounter: Payer: Self-pay | Admitting: Internal Medicine

## 2021-12-20 ENCOUNTER — Encounter: Payer: Self-pay | Admitting: Internal Medicine

## 2021-12-20 ENCOUNTER — Other Ambulatory Visit: Payer: Self-pay | Admitting: Internal Medicine

## 2021-12-20 DIAGNOSIS — E039 Hypothyroidism, unspecified: Secondary | ICD-10-CM

## 2021-12-20 DIAGNOSIS — R931 Abnormal findings on diagnostic imaging of heart and coronary circulation: Secondary | ICD-10-CM | POA: Insufficient documentation

## 2021-12-20 DIAGNOSIS — E785 Hyperlipidemia, unspecified: Secondary | ICD-10-CM | POA: Insufficient documentation

## 2021-12-20 MED ORDER — ROSUVASTATIN CALCIUM 20 MG PO TABS: 20.0000 mg | ORAL_TABLET | Freq: Every day | ORAL | 1 refills | Status: DC

## 2021-12-20 MED ORDER — LEVOTHYROXINE SODIUM 25 MCG PO TABS: 25.0000 ug | ORAL_TABLET | Freq: Every day | ORAL | 0 refills | Status: DC

## 2021-12-20 NOTE — Telephone Encounter (Signed)
LOV 10/04/21

## 2021-12-20 NOTE — Progress Notes (Unsigned)
Lab Results  ?Component Value Date  ? WBC 8.2 10/04/2021  ? HGB 15.8 10/04/2021  ? HCT 45.9 10/04/2021  ? PLT 278.0 10/04/2021  ? GLUCOSE 111 (H) 10/04/2021  ? CHOL 215 (H) 10/04/2021  ? TRIG 271.0 (H) 10/04/2021  ? HDL 36.50 (L) 10/04/2021  ? LDLDIRECT 146.0 10/04/2021  ? LDLCALC 121 (H) 12/30/2020  ? ALT 34 10/04/2021  ? AST 23 10/04/2021  ? NA 139 10/04/2021  ? K 4.1 10/04/2021  ? CL 102 10/04/2021  ? CREATININE 0.86 10/04/2021  ? BUN 9 10/04/2021  ? CO2 31 10/04/2021  ? TSH 5.38 (H) 10/06/2021  ? PSA 1.04 10/04/2021  ? INR 1.11 12/29/2014  ? HGBA1C 5.1 10/04/2021  ?  ?

## 2021-12-21 ENCOUNTER — Ambulatory Visit: Payer: Managed Care, Other (non HMO)

## 2021-12-21 DIAGNOSIS — Z23 Encounter for immunization: Secondary | ICD-10-CM | POA: Diagnosis not present

## 2021-12-21 NOTE — Progress Notes (Deleted)
Patient seen today for second shingles vaccine. ?

## 2022-03-15 ENCOUNTER — Encounter: Payer: Self-pay | Admitting: Internal Medicine

## 2022-03-18 ENCOUNTER — Other Ambulatory Visit: Payer: Self-pay | Admitting: Internal Medicine

## 2022-03-18 DIAGNOSIS — E039 Hypothyroidism, unspecified: Secondary | ICD-10-CM

## 2022-03-21 ENCOUNTER — Other Ambulatory Visit: Payer: Self-pay | Admitting: Internal Medicine

## 2022-03-29 ENCOUNTER — Encounter: Payer: Self-pay | Admitting: Internal Medicine

## 2022-03-29 ENCOUNTER — Ambulatory Visit: Payer: Managed Care, Other (non HMO) | Admitting: Internal Medicine

## 2022-03-29 VITALS — BP 134/84 | HR 74 | Temp 97.9°F | Resp 16 | Ht 72.0 in | Wt 235.0 lb

## 2022-03-29 DIAGNOSIS — I10 Essential (primary) hypertension: Secondary | ICD-10-CM

## 2022-03-29 DIAGNOSIS — E039 Hypothyroidism, unspecified: Secondary | ICD-10-CM | POA: Diagnosis not present

## 2022-03-29 DIAGNOSIS — R931 Abnormal findings on diagnostic imaging of heart and coronary circulation: Secondary | ICD-10-CM | POA: Diagnosis not present

## 2022-03-29 DIAGNOSIS — E785 Hyperlipidemia, unspecified: Secondary | ICD-10-CM | POA: Diagnosis not present

## 2022-03-29 DIAGNOSIS — Z114 Encounter for screening for human immunodeficiency virus [HIV]: Secondary | ICD-10-CM | POA: Insufficient documentation

## 2022-03-29 DIAGNOSIS — Z1159 Encounter for screening for other viral diseases: Secondary | ICD-10-CM | POA: Insufficient documentation

## 2022-03-29 LAB — BASIC METABOLIC PANEL
BUN: 12 mg/dL (ref 6–23)
CO2: 28 mEq/L (ref 19–32)
Calcium: 9.7 mg/dL (ref 8.4–10.5)
Chloride: 103 mEq/L (ref 96–112)
Creatinine, Ser: 0.9 mg/dL (ref 0.40–1.50)
GFR: 97.52 mL/min (ref >60.00)
Glucose, Bld: 109 mg/dL — ABNORMAL HIGH (ref 70–99)
Potassium: 4.6 mEq/L (ref 3.5–5.1)
Sodium: 137 mEq/L (ref 135–145)

## 2022-03-29 LAB — LIPID PANEL
Cholesterol: 133 mg/dL (ref 0–200)
HDL: 43.1 mg/dL (ref >39.00)
LDL Cholesterol: 66 mg/dL (ref 0–99)
NonHDL: 89.44
Total CHOL/HDL Ratio: 3
Triglycerides: 117 mg/dL (ref 0.0–149.0)
VLDL: 23.4 mg/dL (ref 0.0–40.0)

## 2022-03-29 LAB — TSH: TSH: 3.04 u[IU]/mL (ref 0.35–5.50)

## 2022-03-29 NOTE — Progress Notes (Signed)
Subjective:  Patient ID: Chase Mcclure, male    DOB: December 11, 1970  Age: 51 y.o. MRN: [id number]  CC: Hypertension, Hypothyroidism, and Hyperlipidemia   HPI Chase Mcclure presents for f/up -  He walks about 40 minutes a day and does not experience chest pain, shortness of breath, diaphoresis, edema, or fatigue.  He would like to see a cardiologist about his coronary calcium score.  Outpatient Medications Prior to Visit  Medication Sig Dispense Refill   cetirizine (ZYRTEC) 10 MG tablet Take 10 mg by mouth daily.     levothyroxine (SYNTHROID) 25 MCG tablet Take 1 tablet (25 mcg total) by mouth daily before breakfast. 90 tablet 0   olmesartan (BENICAR) 20 MG tablet TAKE 1 TABLET BY MOUTH EVERY DAY 90 tablet 0   rosuvastatin (CRESTOR) 20 MG tablet Take 1 tablet (20 mg total) by mouth daily. 90 tablet 1   vitamin C (ASCORBIC ACID) 500 MG tablet Take 500 mg by mouth daily.     Vitamin D, Ergocalciferol, 2000 units CAPS Take 2 capsules by mouth daily. 30 capsule 0   zinc gluconate 50 MG tablet Take 50 mg by mouth daily.     No facility-administered medications prior to visit.    ROS Review of Systems  Constitutional: Negative.  Negative for diaphoresis, fatigue and unexpected weight change.  HENT: Negative.    Eyes: Negative.   Respiratory:  Negative for cough, chest tightness, shortness of breath and wheezing.   Cardiovascular:  Negative for chest pain, palpitations and leg swelling.  Gastrointestinal:  Negative for abdominal pain, constipation, diarrhea, nausea and vomiting.  Endocrine: Negative.  Negative for cold intolerance and heat intolerance.  Genitourinary:  Positive for difficulty urinating.  Musculoskeletal: Negative.  Negative for myalgias.  Skin: Negative.   Neurological:  Negative for dizziness, weakness, light-headedness and headaches.  Hematological:  Negative for adenopathy. Does not bruise/bleed easily.  Psychiatric/Behavioral: Negative.      Objective:  BP  134/84 (BP Location: Right Arm, Patient Position: Sitting, Cuff Size: Large)   Pulse 74   Temp 97.9 F (36.6 C) (Oral)   Resp 16   Ht 6' (1.829 m)   Wt 235 lb (106.6 kg)   SpO2 97%   BMI 31.87 kg/m   BP Readings from Last 3 Encounters:  03/29/22 134/84  10/04/21 126/86  09/10/21 122/74    Wt Readings from Last 3 Encounters:  03/29/22 235 lb (106.6 kg)  10/04/21 238 lb (108 kg)  09/10/21 225 lb (102.1 kg)    Physical Exam Vitals reviewed.  HENT:     Nose: Nose normal.     Mouth/Throat:     Mouth: Mucous membranes are moist.  Eyes:     General: No scleral icterus.    Conjunctiva/sclera: Conjunctivae normal.  Cardiovascular:     Rate and Rhythm: Normal rate and regular rhythm.     Heart sounds: No murmur heard. Pulmonary:     Effort: Pulmonary effort is normal.     Breath sounds: No stridor. No wheezing, rhonchi or rales.  Abdominal:     General: Abdomen is flat.     Palpations: There is no mass.     Tenderness: There is no abdominal tenderness. There is no guarding or rebound.     Hernia: No hernia is present.  Musculoskeletal:        General: Normal range of motion.     Cervical back: Neck supple.     Right lower leg: No edema.  Left lower leg: No edema.  Lymphadenopathy:     Cervical: No cervical adenopathy.  Skin:    General: Skin is dry.  Neurological:     General: No focal deficit present.     Mental Status: He is alert.  Psychiatric:        Mood and Affect: Mood normal.        Behavior: Behavior normal.     Lab Results  Component Value Date   WBC 8.2 10/04/2021   HGB 15.8 10/04/2021   HCT 45.9 10/04/2021   PLT 278.0 10/04/2021   GLUCOSE 109 (H) 03/29/2022   CHOL 133 03/29/2022   TRIG 117.0 03/29/2022   HDL 43.10 03/29/2022   LDLDIRECT 146.0 10/04/2021   LDLCALC 66 03/29/2022   ALT 34 10/04/2021   AST 23 10/04/2021   NA 137 03/29/2022   K 4.6 03/29/2022   CL 103 03/29/2022   CREATININE 0.90 03/29/2022   BUN 12 03/29/2022   CO2 28  03/29/2022   TSH 3.04 03/29/2022   PSA 1.04 10/04/2021   INR 1.11 12/29/2014   HGBA1C 5.1 10/04/2021    CT CARDIAC SCORING (SELF PAY ONLY)  Addendum Date: 12/10/2021   ADDENDUM REPORT: 12/10/2021 17:56 CLINICAL DATA:  Cardiovascular Disease Risk stratification EXAM: Coronary Calcium Score TECHNIQUE: A gated, non-contrast computed tomography scan of the heart was performed using 106mm slice thickness. Axial images were analyzed on a dedicated workstation. Calcium scoring of the coronary arteries was performed using the Agatston method. FINDINGS: Coronary Calcium Score: Left main: 0 Left anterior descending artery: 99 Left circumflex artery: 72 Right coronary artery: 0 Total: 171 Percentile: 89th Pericardium: Normal. Non-cardiac: See separate report from [facility]. IMPRESSION: Coronary calcium score of 171. This was 89th percentile for age-, race-, and sex-matched controls. RECOMMENDATIONS: Coronary artery calcium (CAC) score is a strong predictor of incident coronary heart disease (CHD) and provides predictive information beyond traditional risk factors. CAC scoring is reasonable to use in the decision to withhold, postpone, or initiate statin therapy in intermediate-risk or selected borderline-risk asymptomatic adults (age 78-75 years and LDL-C >=70 to <190 mg/dL) who do not have diabetes or established atherosclerotic cardiovascular disease (ASCVD).* In intermediate-risk (10-year ASCVD risk >=7.5% to <20%) adults or selected borderline-risk (10-year ASCVD risk >=5% to <7.5%) adults in whom a CAC score is measured for the purpose of making a treatment decision the following recommendations have been made: If CAC=0, it is reasonable to withhold statin therapy and reassess in 5 to 10 years, as long as higher risk conditions are absent (diabetes mellitus, family history of premature CHD in first degree relatives (males <55 years; females <65 years), cigarette smoking, or LDL >=190 mg/dL). If CAC is 1  to 99, it is reasonable to initiate statin therapy for patients >=58 years of age. If CAC is >=100 or >=75th percentile, it is reasonable to initiate statin therapy at any age. Cardiology referral should be considered for patients with CAC scores >=400 or >=75th percentile. *2018 AHA/ACC/AACVPR/AAPA/ABC/ACPM/ADA/AGS/APhA/ASPC/NLA/PCNA Guideline on the Management of Blood Cholesterol: A Report of the American College of Cardiology/American Heart Association Task Force on Clinical Practice Guidelines. J Am Coll Cardiol. 2019;73(24):3168-3209. Lennie Odor, MD Electronically Signed   By: Lennie Odor M.D.   On: 12/10/2021 17:56   Result Date: 12/10/2021 EXAM: OVER-READ INTERPRETATION  CT CHEST The following report is an over-read performed by radiologist Dr. Jeronimo Greaves of [facility] on 12/09/2021. This over-read does not include interpretation of cardiac or coronary anatomy or pathology. The  calcium score interpretation by the cardiologist is attached. COMPARISON:  10/04/2021 chest radiograph chest CT 12/09/2014 FINDINGS: Vascular: Normal aortic caliber. Mediastinum/Nodes: No imaged thoracic adenopathy. Lungs/Pleura: 2 mm calcified granuloma in the left lower lobe on 24/4. No pleural fluid. Upper Abdomen: Normal imaged portions of the liver, spleen, stomach. Musculoskeletal: No acute osseous abnormality. IMPRESSION: No acute findings in the imaged extracardiac chest. Electronically Signed: By: Jeronimo Greaves M.D. On: 12/09/2021 10:52    Assessment & Plan:   Jaelynn was seen today for hypertension, hypothyroidism and hyperlipidemia.  Diagnoses and all orders for this visit:  Essential hypertension- His blood pressure is adequately well controlled. -     Basic metabolic panel; Future -     Basic metabolic panel  Agatston coronary artery calcium score between 100 and 199 -     Lipid panel; Future -     Ambulatory referral to Cardiology -     Lipid panel  Acquired hypothyroidism- He is  euthyroid. -     TSH; Future -     TSH  Hyperlipidemia LDL goal <100- LDL goal achieved. Doing well on the statin  -     TSH; Future -     Lipid panel; Future -     Lipid panel -     TSH  Need for hepatitis C screening test -     HIV Antibody (routine testing w rflx); Future -     HIV Antibody (routine testing w rflx)  Encounter for screening for HIV -     Hepatitis C antibody; Future -     Hepatitis C antibody   I am having Kolby L. Wetherell "Joey" maintain his Vitamin D (Ergocalciferol), cetirizine, ascorbic acid, zinc gluconate, levothyroxine, rosuvastatin, and olmesartan.  No orders of the defined types were placed in this encounter.    Follow-up: Return in about 6 months (around 09/29/2022).  Sanda Linger, MD

## 2022-03-29 NOTE — Patient Instructions (Signed)

## 2022-03-30 LAB — HEPATITIS C ANTIBODY: Hepatitis C Ab: NONREACTIVE

## 2022-03-30 LAB — HIV ANTIBODY (ROUTINE TESTING W REFLEX): HIV 1&2 Ab, 4th Generation: NONREACTIVE

## 2022-04-18 ENCOUNTER — Encounter: Payer: Self-pay | Admitting: *Deleted

## 2022-06-15 ENCOUNTER — Other Ambulatory Visit: Payer: Self-pay | Admitting: Internal Medicine

## 2022-06-15 DIAGNOSIS — R931 Abnormal findings on diagnostic imaging of heart and coronary circulation: Secondary | ICD-10-CM

## 2022-06-15 DIAGNOSIS — E785 Hyperlipidemia, unspecified: Secondary | ICD-10-CM

## 2022-06-25 ENCOUNTER — Other Ambulatory Visit: Payer: Self-pay | Admitting: Internal Medicine

## 2022-06-29 ENCOUNTER — Other Ambulatory Visit: Payer: Self-pay | Admitting: Internal Medicine

## 2022-10-03 ENCOUNTER — Ambulatory Visit: Payer: Managed Care, Other (non HMO) | Admitting: Internal Medicine

## 2022-11-06 ENCOUNTER — Ambulatory Visit (INDEPENDENT_AMBULATORY_CARE_PROVIDER_SITE_OTHER): Payer: 59 | Admitting: Internal Medicine

## 2022-11-06 ENCOUNTER — Ambulatory Visit (INDEPENDENT_AMBULATORY_CARE_PROVIDER_SITE_OTHER): Payer: 59

## 2022-11-06 ENCOUNTER — Encounter: Payer: Self-pay | Admitting: Internal Medicine

## 2022-11-06 VITALS — BP 128/82 | HR 84 | Temp 98.3°F | Resp 16 | Ht 72.0 in | Wt 237.0 lb

## 2022-11-06 DIAGNOSIS — D860 Sarcoidosis of lung: Secondary | ICD-10-CM

## 2022-11-06 DIAGNOSIS — E039 Hypothyroidism, unspecified: Secondary | ICD-10-CM

## 2022-11-06 DIAGNOSIS — Z0001 Encounter for general adult medical examination with abnormal findings: Secondary | ICD-10-CM

## 2022-11-06 DIAGNOSIS — D869 Sarcoidosis, unspecified: Secondary | ICD-10-CM | POA: Diagnosis not present

## 2022-11-06 DIAGNOSIS — I10 Essential (primary) hypertension: Secondary | ICD-10-CM | POA: Diagnosis not present

## 2022-11-06 DIAGNOSIS — R739 Hyperglycemia, unspecified: Secondary | ICD-10-CM | POA: Diagnosis not present

## 2022-11-06 DIAGNOSIS — Z0000 Encounter for general adult medical examination without abnormal findings: Secondary | ICD-10-CM | POA: Diagnosis not present

## 2022-11-06 LAB — HEPATIC FUNCTION PANEL
ALT: 40 U/L (ref 0–53)
AST: 31 U/L (ref 0–37)
Albumin: 4.5 g/dL (ref 3.5–5.2)
Alkaline Phosphatase: 75 U/L (ref 39–117)
Bilirubin, Direct: 0.2 mg/dL (ref 0.0–0.3)
Total Bilirubin: 1 mg/dL (ref 0.2–1.2)
Total Protein: 7.4 g/dL (ref 6.0–8.3)

## 2022-11-06 LAB — CBC WITH DIFFERENTIAL/PLATELET
Basophils Absolute: 0 10*3/uL (ref 0.0–0.1)
Basophils Relative: 0.4 % (ref 0.0–3.0)
Eosinophils Absolute: 0.2 10*3/uL (ref 0.0–0.7)
Eosinophils Relative: 2.6 % (ref 0.0–5.0)
HCT: 46.4 % (ref 39.0–52.0)
Hemoglobin: 16.5 g/dL (ref 13.0–17.0)
Lymphocytes Relative: 29.2 % (ref 12.0–46.0)
Lymphs Abs: 2.6 10*3/uL (ref 0.7–4.0)
MCHC: 35.7 g/dL (ref 30.0–36.0)
MCV: 93.3 fl (ref 78.0–100.0)
Monocytes Absolute: 0.7 10*3/uL (ref 0.1–1.0)
Monocytes Relative: 7.6 % (ref 3.0–12.0)
Neutro Abs: 5.3 10*3/uL (ref 1.4–7.7)
Neutrophils Relative %: 60.2 % (ref 43.0–77.0)
Platelets: 335 10*3/uL (ref 150.0–400.0)
RBC: 4.97 Mil/uL (ref 4.22–5.81)
RDW: 13.2 % (ref 11.5–15.5)
WBC: 8.8 10*3/uL (ref 4.0–10.5)

## 2022-11-06 LAB — HEMOGLOBIN A1C: Hgb A1c MFr Bld: 5.7 % (ref 4.6–6.5)

## 2022-11-06 LAB — BASIC METABOLIC PANEL
BUN: 13 mg/dL (ref 6–23)
CO2: 27 mEq/L (ref 19–32)
Calcium: 10.5 mg/dL (ref 8.4–10.5)
Chloride: 102 mEq/L (ref 96–112)
Creatinine, Ser: 0.89 mg/dL (ref 0.40–1.50)
GFR: 97.43 mL/min (ref >60.00)
Glucose, Bld: 100 mg/dL — ABNORMAL HIGH (ref 70–99)
Potassium: 5.2 mEq/L — ABNORMAL HIGH (ref 3.5–5.1)
Sodium: 139 mEq/L (ref 135–145)

## 2022-11-06 LAB — PSA: PSA: 0.94 ng/mL (ref 0.10–4.00)

## 2022-11-06 LAB — TSH: TSH: 4.95 u[IU]/mL (ref 0.35–5.50)

## 2022-11-06 MED ORDER — LEVOTHYROXINE SODIUM 25 MCG PO TABS: 25.0000 ug | ORAL_TABLET | Freq: Every day | ORAL | 1 refills | Status: DC

## 2022-11-06 NOTE — Progress Notes (Unsigned)
Subjective:  Patient ID: Chase Mcclure, male    DOB: 04/15/1971  Age: 52 y.o. MRN: [id number]  CC: Annual Exam, Hypertension, Hypothyroidism, and Hyperlipidemia   HPI Chase Mcclure presents for a CPX and f/up ----  He is active and denies chest pain, shortness of breath, diaphoresis, or edema.  Outpatient Medications Prior to Visit  Medication Sig Dispense Refill   cetirizine (ZYRTEC) 10 MG tablet Take 10 mg by mouth daily.     levothyroxine (SYNTHROID) 25 MCG tablet Take 1 tablet (25 mcg total) by mouth daily before breakfast. 90 tablet 0   rosuvastatin (CRESTOR) 20 MG tablet TAKE 1 TABLET BY MOUTH EVERY DAY 90 tablet 1   Vitamin D, Ergocalciferol, 2000 units CAPS Take 2 capsules by mouth daily. 30 capsule 0   zinc gluconate 50 MG tablet Take 50 mg by mouth daily.     olmesartan (BENICAR) 20 MG tablet TAKE 1 TABLET BY MOUTH EVERY DAY 90 tablet 1   vitamin C (ASCORBIC ACID) 500 MG tablet Take 500 mg by mouth daily.     No facility-administered medications prior to visit.    ROS Review of Systems  Constitutional:  Negative for diaphoresis and fatigue.  HENT: Negative.    Eyes: Negative.   Respiratory:  Positive for cough. Negative for chest tightness, shortness of breath and wheezing.        Rare cough  Cardiovascular:  Negative for chest pain, palpitations and leg swelling.  Gastrointestinal:  Negative for abdominal pain, constipation, diarrhea, nausea and vomiting.  Endocrine: Negative.   Genitourinary: Negative.  Negative for difficulty urinating.  Musculoskeletal:  Negative for arthralgias and myalgias.  Skin: Negative.   Neurological:  Negative for dizziness and weakness.  Hematological:  Negative for adenopathy. Does not bruise/bleed easily.  Psychiatric/Behavioral: Negative.      Objective:  BP 128/82 (BP Location: Left Arm, Patient Position: Sitting, Cuff Size: Large)   Pulse 84   Temp 98.3 F (36.8 C) (Oral)   Resp 16   Ht 6' (1.829 m)   Wt 237 lb  (107.5 kg)   SpO2 96%   BMI 32.14 kg/m   BP Readings from Last 3 Encounters:  11/06/22 128/82  03/29/22 134/84  10/04/21 126/86    Wt Readings from Last 3 Encounters:  11/06/22 237 lb (107.5 kg)  03/29/22 235 lb (106.6 kg)  10/04/21 238 lb (108 kg)    Physical Exam Vitals reviewed.  Constitutional:      Appearance: Normal appearance.  HENT:     Nose: Nose normal.     Mouth/Throat:     Mouth: Mucous membranes are moist.  Eyes:     General: No scleral icterus.    Conjunctiva/sclera: Conjunctivae normal.  Cardiovascular:     Rate and Rhythm: Normal rate and regular rhythm.     Heart sounds: No murmur heard. Pulmonary:     Effort: Pulmonary effort is normal.     Breath sounds: No stridor. No wheezing, rhonchi or rales.  Abdominal:     General: Abdomen is protuberant. Bowel sounds are normal. There is no distension.     Palpations: Abdomen is soft. There is no hepatomegaly, splenomegaly or mass.     Tenderness: There is no abdominal tenderness.     Hernia: There is no hernia in the left inguinal area or right inguinal area.  Genitourinary:    Pubic Area: No rash.      Penis: Normal and circumcised.      Testes: Normal.  Epididymis:     Right: Normal.     Left: Normal.     Prostate: Normal. Not enlarged, not tender and no nodules present.     Rectum: Normal. Guaiac result negative. No mass, tenderness, anal fissure, external hemorrhoid or internal hemorrhoid. Normal anal tone.  Musculoskeletal:        General: Normal range of motion.     Cervical back: Neck supple.     Right lower leg: No edema.     Left lower leg: No edema.  Lymphadenopathy:     Cervical: No cervical adenopathy.     Lower Body: No right inguinal adenopathy. No left inguinal adenopathy.  Skin:    General: Skin is warm and dry.     Coloration: Skin is not pale.  Neurological:     General: No focal deficit present.     Mental Status: He is alert. Mental status is at baseline.  Psychiatric:         Mood and Affect: Mood normal.        Behavior: Behavior normal.     Lab Results  Component Value Date   WBC 8.8 11/06/2022   HGB 16.5 11/06/2022   HCT 46.4 11/06/2022   PLT 335.0 11/06/2022   GLUCOSE 100 (H) 11/06/2022   CHOL 133 03/29/2022   TRIG 117.0 03/29/2022   HDL 43.10 03/29/2022   LDLDIRECT 146.0 10/04/2021   LDLCALC 66 03/29/2022   ALT 40 11/06/2022   AST 31 11/06/2022   NA 139 11/06/2022   K 5.2 No hemolysis seen (H) 11/06/2022   CL 102 11/06/2022   CREATININE 0.89 11/06/2022   BUN 13 11/06/2022   CO2 27 11/06/2022   TSH 4.95 11/06/2022   PSA 0.94 11/06/2022   INR 1.11 12/29/2014   HGBA1C 5.7 11/06/2022    CT CARDIAC SCORING (SELF PAY ONLY)  Addendum Date: 12/10/2021   ADDENDUM REPORT: 12/10/2021 17:56 CLINICAL DATA:  Cardiovascular Disease Risk stratification EXAM: Coronary Calcium Score TECHNIQUE: A gated, non-contrast computed tomography scan of the heart was performed using 60m slice thickness. Axial images were analyzed on a dedicated workstation. Calcium scoring of the coronary arteries was performed using the Agatston method. FINDINGS: Coronary Calcium Score: Left main: 0 Left anterior descending artery: 99 Left circumflex artery: 72 Right coronary artery: 0 Total: 171 Percentile: 89th Pericardium: Normal. Non-cardiac: [facility]. IMPRESSION: Coronary calcium score of 171. This was 89th percentile for age-, race-, and sex-matched controls. RECOMMENDATIONS: Coronary artery calcium (CAC) score is a strong predictor of incident coronary heart disease (CHD) and provides predictive information beyond traditional risk factors. CAC scoring is reasonable to use in the decision to withhold, postpone, or initiate statin therapy in intermediate-risk or selected borderline-risk asymptomatic adults (age 199-75years and LDL-C >=70 to <190 mg/dL) who do not have diabetes or established atherosclerotic cardiovascular disease (ASCVD).* In  intermediate-risk (10-year ASCVD risk >=7.5% to <20%) adults or selected borderline-risk (10-year ASCVD risk >=5% to <7.5%) adults in whom a CAC score is measured for the purpose of making a treatment decision the following recommendations have been made: If CAC=0, it is reasonable to withhold statin therapy and reassess in 5 to 10 years, as long as higher risk conditions are absent (diabetes mellitus, family history of premature CHD in first degree relatives (males <55 years; females <65 years), cigarette smoking, or LDL >=190 mg/dL). If CAC is 1 to 99, it is reasonable to initiate statin therapy for patients >=514years of age. If CAC is >=  100 or >=75th percentile, it is reasonable to initiate statin therapy at any age. Cardiology referral should be considered for patients with CAC scores >=400 or >=75th percentile. *2018 AHA/ACC/AACVPR/AAPA/ABC/ACPM/ADA/AGS/APhA/ASPC/NLA/PCNA Guideline on the Management of Blood Cholesterol: A Report of the American College of Cardiology/American Heart Association Task Force on Clinical Practice Guidelines. J Am Coll Cardiol. 2019;73(24):3168-3209. Eleonore Chiquito, MD Electronically Signed   By: Eleonore Chiquito M.D.   On: 12/10/2021 17:56   Result Date: 12/10/2021 EXAM: OVER-READ INTERPRETATION  CT CHEST The following report is an over-read performed by radiologist Dr. Abigail Miyamoto of [facility], [place] on 12/09/2021. This over-read does not include interpretation of cardiac or coronary anatomy or pathology. The calcium score interpretation by the cardiologist is attached. COMPARISON:  10/04/2021 chest radiograph chest CT 12/09/2014 FINDINGS: Vascular: Normal aortic caliber. Mediastinum/Nodes: No imaged thoracic adenopathy. Lungs/Pleura: 2 mm calcified granuloma in the left lower lobe on 24/4. No pleural fluid. Upper Abdomen: Normal imaged portions of the liver, spleen, stomach. Musculoskeletal: No acute osseous abnormality. IMPRESSION: No acute findings in the imaged  extracardiac chest. Electronically Signed: By: Abigail Miyamoto M.D. On: 12/09/2021 10:52   DG Chest 2 View  Result Date: 11/06/2022 CLINICAL DATA:  52 year old male with sarcoid EXAM: CHEST - 2 VIEW COMPARISON:  10/04/2021 FINDINGS: Cardiomediastinal silhouette unchanged in size and contour. No evidence of central vascular congestion. No interlobular septal thickening. No significant mediastinal adenopathy. No pneumothorax or pleural effusion. Coarsened interstitial markings, with no confluent airspace disease. No acute displaced fracture. Degenerative changes of the spine. IMPRESSION: Negative for acute cardiopulmonary disease Electronically Signed   By: Corrie Mckusick D.O.   On: 11/06/2022 10:30     Assessment & Plan:   Chase Mcclure was seen today for annual exam, hypertension, hypothyroidism and hyperlipidemia.  Diagnoses and all orders for this visit:  Essential hypertension- His blood pressure is very well-controlled but he is hyperkalemic.  Will discontinue the ARB. -     Basic metabolic panel; Future -     CBC with Differential/Platelet; Future -     TSH; Future -     Hepatic function panel; Future -     Hepatic function panel -     TSH -     CBC with Differential/Platelet -     Basic metabolic panel  Pulmonary sarcoidosis ([place])- There is no evidence of sarcoid activity. -     Basic metabolic panel; Future -     Hepatic function panel; Future -     DG Chest 2 View; Future -     Hepatic function panel -     Basic metabolic panel  Acquired hypothyroidism- He is euthyroid. -     TSH; Future -     TSH  Chronic hyperglycemia- His A1c is normal. -     Basic metabolic panel; Future -     Hemoglobin A1c; Future -     Hemoglobin A1c -     Basic metabolic panel  Encounter for general adult medical examination with abnormal findings- Exam completed, labs reviewed, vaccines reviewed, cancer screenings are up-to-date, patient education was given. -     PSA; Future -     PSA   I have  discontinued Chase John L. Delon "Joey"'s ascorbic acid and olmesartan. I am also having him maintain his Vitamin D (Ergocalciferol), cetirizine, zinc gluconate, levothyroxine, and rosuvastatin.  No orders of the defined types were placed in this encounter.    Follow-up: Return in about 6 months (around 05/09/2023).  Scarlette Calico, MD

## 2022-11-06 NOTE — Patient Instructions (Signed)
Health Maintenance, Male Adopting a healthy lifestyle and getting preventive care are important in promoting health and wellness. Ask your health care provider about: The right schedule for you to have regular tests and exams. Things you can do on your own to prevent diseases and keep yourself healthy. What should I know about diet, weight, and exercise? Eat a healthy diet  Eat a diet that includes plenty of vegetables, fruits, low-fat dairy products, and lean protein. Do not eat a lot of foods that are high in solid fats, added sugars, or sodium. Maintain a healthy weight Body mass index (BMI) is a measurement that can be used to identify possible weight problems. It estimates body fat based on height and weight. Your health care provider can help determine your BMI and help you achieve or maintain a healthy weight. Get regular exercise Get regular exercise. This is one of the most important things you can do for your health. Most adults should: Exercise for at least 150 minutes each week. The exercise should increase your heart rate and make you sweat (moderate-intensity exercise). Do strengthening exercises at least twice a week. This is in addition to the moderate-intensity exercise. Spend less time sitting. Even light physical activity can be beneficial. Watch cholesterol and blood lipids Have your blood tested for lipids and cholesterol at 54 years of age, then have this test every 5 years. You may need to have your cholesterol levels checked more often if: Your lipid or cholesterol levels are high. You are older than 54 years of age. You are at high risk for heart disease. What should I know about cancer screening? Many types of cancers can be detected early and may often be prevented. Depending on your health history and family history, you may need to have cancer screening at various ages. This may include screening for: Colorectal cancer. Prostate cancer. Skin cancer. Lung  cancer. What should I know about heart disease, diabetes, and high blood pressure? Blood pressure and heart disease High blood pressure causes heart disease and increases the risk of stroke. This is more likely to develop in people who have high blood pressure readings or are overweight. Talk with your health care provider about your target blood pressure readings. Have your blood pressure checked: Every 3-5 years if you are 18-39 years of age. Every year if you are 40 years old or older. If you are between the ages of 65 and 75 and are a current or former smoker, ask your health care provider if you should have a one-time screening for abdominal aortic aneurysm (AAA). Diabetes Have regular diabetes screenings. This checks your fasting blood sugar level. Have the screening done: Once every three years after age 45 if you are at a normal weight and have a low risk for diabetes. More often and at a younger age if you are overweight or have a high risk for diabetes. What should I know about preventing infection? Hepatitis B If you have a higher risk for hepatitis B, you should be screened for this virus. Talk with your health care provider to find out if you are at risk for hepatitis B infection. Hepatitis C Blood testing is recommended for: Everyone born from 1945 through 1965. Anyone with known risk factors for hepatitis C. Sexually transmitted infections (STIs) You should be screened each year for STIs, including gonorrhea and chlamydia, if: You are sexually active and are younger than 54 years of age. You are older than 54 years of age and your   health care provider tells you that you are at risk for this type of infection. Your sexual activity has changed since you were last screened, and you are at increased risk for chlamydia or gonorrhea. Ask your health care provider if you are at risk. Ask your health care provider about whether you are at high risk for HIV. Your health care provider  may recommend a prescription medicine to help prevent HIV infection. If you choose to take medicine to prevent HIV, you should first get tested for HIV. You should then be tested every 3 months for as long as you are taking the medicine. Follow these instructions at home: Alcohol use Do not drink alcohol if your health care provider tells you not to drink. If you drink alcohol: Limit how much you have to 0-2 drinks a day. Know how much alcohol is in your drink. In the [place] one drink equals one 12 oz bottle of beer (355 mL), one 5 oz glass of wine (148 mL), or one 1 oz glass of hard liquor (44 mL). Lifestyle Do not use any products that contain nicotine or tobacco. These products include cigarettes, chewing tobacco, and vaping devices, such as e-cigarettes. If you need help quitting, ask your health care provider. Do not use street drugs. Do not share needles. Ask your health care provider for help if you need support or information about quitting drugs. General instructions Schedule regular health, dental, and eye exams. Stay current with your vaccines. Tell your health care provider if: You often feel depressed. You have ever been abused or do not feel safe at home. Summary Adopting a healthy lifestyle and getting preventive care are important in promoting health and wellness. Follow your health care provider's instructions about healthy diet, exercising, and getting tested or screened for diseases. Follow your health care provider's instructions on monitoring your cholesterol and blood pressure. This information is not intended to replace advice given to you by your health care provider. Make sure you discuss any questions you have with your health care provider. Document Revised: 01/10/2021 Document Reviewed: 01/10/2021 Elsevier Patient Education  2023 Elsevier Inc.  

## 2022-11-13 ENCOUNTER — Encounter: Payer: Self-pay | Admitting: Internal Medicine

## 2022-11-14 MED ORDER — INDAPAMIDE 1.25 MG PO TABS: 1.2500 mg | ORAL_TABLET | Freq: Every day | ORAL | 0 refills | Status: DC

## 2022-11-14 MED ORDER — AMLODIPINE BESYLATE 5 MG PO TABS: 5.0000 mg | ORAL_TABLET | Freq: Every day | ORAL | 0 refills | Status: DC

## 2022-11-14 NOTE — Addendum Note (Signed)
Addended by: Janith Lima on: 11/14/2022 08:29 AM   Modules accepted: Orders

## 2023-01-08 ENCOUNTER — Ambulatory Visit (INDEPENDENT_AMBULATORY_CARE_PROVIDER_SITE_OTHER): Payer: 59 | Admitting: Internal Medicine

## 2023-01-08 VITALS — BP 128/84 | HR 85 | Temp 98.1°F | Resp 16 | Ht 72.0 in | Wt 225.0 lb

## 2023-01-08 DIAGNOSIS — E039 Hypothyroidism, unspecified: Secondary | ICD-10-CM | POA: Diagnosis not present

## 2023-01-08 DIAGNOSIS — D860 Sarcoidosis of lung: Secondary | ICD-10-CM

## 2023-01-08 DIAGNOSIS — R931 Abnormal findings on diagnostic imaging of heart and coronary circulation: Secondary | ICD-10-CM

## 2023-01-08 DIAGNOSIS — I4510 Unspecified right bundle-branch block: Secondary | ICD-10-CM | POA: Diagnosis not present

## 2023-01-08 DIAGNOSIS — R0609 Other forms of dyspnea: Secondary | ICD-10-CM | POA: Diagnosis not present

## 2023-01-08 DIAGNOSIS — I10 Essential (primary) hypertension: Secondary | ICD-10-CM

## 2023-01-08 DIAGNOSIS — E785 Hyperlipidemia, unspecified: Secondary | ICD-10-CM | POA: Diagnosis not present

## 2023-01-08 LAB — BASIC METABOLIC PANEL
BUN: 9 mg/dL (ref 6–23)
CO2: 31 mEq/L (ref 19–32)
Calcium: 10.1 mg/dL (ref 8.4–10.5)
Chloride: 99 mEq/L (ref 96–112)
Creatinine, Ser: 0.81 mg/dL (ref 0.40–1.50)
GFR: 100.12 mL/min (ref >60.00)
Glucose, Bld: 123 mg/dL — ABNORMAL HIGH (ref 70–99)
Potassium: 4.4 mEq/L (ref 3.5–5.1)
Sodium: 141 mEq/L (ref 135–145)

## 2023-01-08 LAB — TROPONIN I (HIGH SENSITIVITY): High Sens Troponin I: 3 ng/L (ref 2–17)

## 2023-01-08 LAB — TSH: TSH: 3.03 u[IU]/mL (ref 0.35–5.50)

## 2023-01-08 NOTE — Patient Instructions (Signed)
Hypertension, Adult High blood pressure (hypertension) is when the force of blood pumping through the arteries is too strong. The arteries are the blood vessels that carry blood from the heart throughout the body. Hypertension forces the heart to work harder to pump blood and may cause arteries to become narrow or stiff. Untreated or uncontrolled hypertension can lead to a heart attack, heart failure, a stroke, kidney disease, and other problems. A blood pressure reading consists of a higher number over a lower number. Ideally, your blood pressure should be below 120/80. The first ("top") number is called the systolic pressure. It is a measure of the pressure in your arteries as your heart beats. The second ("bottom") number is called the diastolic pressure. It is a measure of the pressure in your arteries as the heart relaxes. What are the causes? The exact cause of this condition is not known. There are some conditions that result in high blood pressure. What increases the risk? Certain factors may make you more likely to develop high blood pressure. Some of these risk factors are under your control, including: Smoking. Not getting enough exercise or physical activity. Being overweight. Having too much fat, sugar, calories, or salt (sodium) in your diet. Drinking too much alcohol. Other risk factors include: Having a personal history of heart disease, diabetes, high cholesterol, or kidney disease. Stress. Having a family history of high blood pressure and high cholesterol. Having obstructive sleep apnea. Age. The risk increases with age. What are the signs or symptoms? High blood pressure may not cause symptoms. Very high blood pressure (hypertensive crisis) may cause: Headache. Fast or irregular heartbeats (palpitations). Shortness of breath. Nosebleed. Nausea and vomiting. Vision changes. Severe chest pain, dizziness, and seizures. How is this diagnosed? This condition is diagnosed by  measuring your blood pressure while you are seated, with your arm resting on a flat surface, your legs uncrossed, and your feet flat on the floor. The cuff of the blood pressure monitor will be placed directly against the skin of your upper arm at the level of your heart. Blood pressure should be measured at least twice using the same arm. Certain conditions can cause a difference in blood pressure between your right and left arms. If you have a high blood pressure reading during one visit or you have normal blood pressure with other risk factors, you may be asked to: Return on a different day to have your blood pressure checked again. Monitor your blood pressure at home for 1 week or longer. If you are diagnosed with hypertension, you may have other blood or imaging tests to help your health care provider understand your overall risk for other conditions. How is this treated? This condition is treated by making healthy lifestyle changes, such as eating healthy foods, exercising more, and reducing your alcohol intake. You may be referred for counseling on a healthy diet and physical activity. Your health care provider may prescribe medicine if lifestyle changes are not enough to get your blood pressure under control and if: Your systolic blood pressure is above 130. Your diastolic blood pressure is above 80. Your personal target blood pressure may vary depending on your medical conditions, your age, and other factors. Follow these instructions at home: Eating and drinking  Eat a diet that is high in fiber and potassium, and low in sodium, added sugar, and fat. An example of this eating plan is called the DASH diet. DASH stands for Dietary Approaches to Stop Hypertension. To eat this way: Eat   plenty of fresh fruits and vegetables. Try to fill one half of your plate at each meal with fruits and vegetables. Eat whole grains, such as whole-wheat pasta, brown rice, or whole-grain bread. Fill about one  fourth of your plate with whole grains. Eat or drink low-fat dairy products, such as skim milk or low-fat yogurt. Avoid fatty cuts of meat, processed or cured meats, and poultry with skin. Fill about one fourth of your plate with lean proteins, such as fish, chicken without skin, beans, eggs, or tofu. Avoid pre-made and processed foods. These tend to be higher in sodium, added sugar, and fat. Reduce your daily sodium intake. Many people with hypertension should eat less than 1,500 mg of sodium a day. Do not drink alcohol if: Your health care provider tells you not to drink. You are pregnant, may be pregnant, or are planning to become pregnant. If you drink alcohol: Limit how much you have to: 0-1 drink a day for women. 0-2 drinks a day for men. Know how much alcohol is in your drink. In the [place] one drink equals one 12 oz bottle of beer (355 mL), one 5 oz glass of wine (148 mL), or one 1 oz glass of hard liquor (44 mL). Lifestyle  Work with your health care provider to maintain a healthy body weight or to lose weight. Ask what an ideal weight is for you. Get at least 30 minutes of exercise that causes your heart to beat faster (aerobic exercise) most days of the week. Activities may include walking, swimming, or biking. Include exercise to strengthen your muscles (resistance exercise), such as Pilates or lifting weights, as part of your weekly exercise routine. Try to do these types of exercises for 30 minutes at least 3 days a week. Do not use any products that contain nicotine or tobacco. These products include cigarettes, chewing tobacco, and vaping devices, such as e-cigarettes. If you need help quitting, ask your health care provider. Monitor your blood pressure at home as told by your health care provider. Keep all follow-up visits. This is important. Medicines Take over-the-counter and prescription medicines only as told by your health care provider. Follow directions carefully. Blood  pressure medicines must be taken as prescribed. Do not skip doses of blood pressure medicine. Doing this puts you at risk for problems and can make the medicine less effective. Ask your health care provider about side effects or reactions to medicines that you should watch for. Contact a health care provider if you: Think you are having a reaction to a medicine you are taking. Have headaches that keep coming back (recurring). Feel dizzy. Have swelling in your ankles. Have trouble with your vision. Get help right away if you: Develop a severe headache or confusion. Have unusual weakness or numbness. Feel faint. Have severe pain in your chest or abdomen. Vomit repeatedly. Have trouble breathing. These symptoms may be an emergency. Get help right away. Call 911. Do not wait to see if the symptoms will go away. Do not drive yourself to the hospital. Summary Hypertension is when the force of blood pumping through your arteries is too strong. If this condition is not controlled, it may put you at risk for serious complications. Your personal target blood pressure may vary depending on your medical conditions, your age, and other factors. For most people, a normal blood pressure is less than 120/80. Hypertension is treated with lifestyle changes, medicines, or a combination of both. Lifestyle changes include losing weight, eating a healthy,   low-sodium diet, exercising more, and limiting alcohol. This information is not intended to replace advice given to you by your health care provider. Make sure you discuss any questions you have with your health care provider. Document Revised: 06/28/2021 Document Reviewed: 06/28/2021 Elsevier Patient Education  2023 [employer] Inc.  

## 2023-01-08 NOTE — Progress Notes (Unsigned)
Subjective:  Patient ID: Chase Mcclure, male    DOB: [birth year]  Age: 52 y.o. MRN: [id number]  CC: Hypertension and Hypothyroidism   HPI Chase Mcclure presents for f/up ---  He recently [facility] and experienced dyspnea on exertion and fatigue.  He denies chest pain, diaphoresis, edema, dizziness, or lightheadedness.  Outpatient Medications Prior to Visit  Medication Sig Dispense Refill   amLODipine (NORVASC) 5 MG tablet Take 1 tablet (5 mg total) by mouth daily. 90 tablet 0   cetirizine (ZYRTEC) 10 MG tablet Take 10 mg by mouth daily.     levothyroxine (SYNTHROID) 25 MCG tablet Take 1 tablet (25 mcg total) by mouth daily before breakfast. 90 tablet 1   Vitamin D, Ergocalciferol, 2000 units CAPS Take 2 capsules by mouth daily. 30 capsule 0   indapamide (LOZOL) 1.25 MG tablet Take 1 tablet (1.25 mg total) by mouth daily. 90 tablet 0   rosuvastatin (CRESTOR) 20 MG tablet TAKE 1 TABLET BY MOUTH EVERY DAY 90 tablet 1   zinc gluconate 50 MG tablet Take 50 mg by mouth daily.     No facility-administered medications prior to visit.    ROS Review of Systems  Constitutional:  Positive for fatigue. Negative for diaphoresis and unexpected weight change.  HENT: Negative.    Eyes: Negative.   Respiratory:  Positive for shortness of breath. Negative for cough, chest tightness and wheezing.   Cardiovascular:  Negative for chest pain, palpitations and leg swelling.  Gastrointestinal:  Negative for abdominal pain, constipation, diarrhea, nausea and vomiting.  Endocrine: Negative.   Genitourinary: Negative.  Negative for difficulty urinating.  Musculoskeletal: Negative.  Negative for arthralgias and myalgias.  Skin: Negative.   Neurological:  Negative for dizziness, weakness and light-headedness.  Hematological:  Negative for adenopathy. Does not bruise/bleed easily.  Psychiatric/Behavioral: Negative.      Objective:  BP 128/84 (BP Location: Left Arm, Patient Position:  Sitting, Cuff Size: Large)   Pulse 85   Temp 98.1 F (36.7 C) (Oral)   Resp 16   Ht 6' (1.829 m)   Wt 225 lb (102.1 kg)   SpO2 97%   BMI 30.52 kg/m   BP Readings from Last 3 Encounters:  01/08/23 128/84  11/06/22 128/82  03/29/22 134/84    Wt Readings from Last 3 Encounters:  01/08/23 225 lb (102.1 kg)  11/06/22 237 lb (107.5 kg)  03/29/22 235 lb (106.6 kg)    Physical Exam Vitals reviewed.  Constitutional:      Appearance: Normal appearance.  HENT:     Nose: Nose normal.     Mouth/Throat:     Mouth: Mucous membranes are moist.  Eyes:     General: No scleral icterus.    Conjunctiva/sclera: Conjunctivae normal.  Cardiovascular:     Rate and Rhythm: Normal rate and regular rhythm.     Heart sounds: No murmur heard.    No gallop.     Comments: EKG- NSR, 72 bpm Incomplete RBBB unchanged No LVH or Q waves  Pulmonary:     Effort: Pulmonary effort is normal.     Breath sounds: No stridor. No wheezing, rhonchi or rales.  Abdominal:     General: Abdomen is flat.     Palpations: There is no mass.     Tenderness: There is no abdominal tenderness. There is no guarding.     Hernia: No hernia is present.  Musculoskeletal:     Cervical back: Neck supple.     Right lower  leg: No edema.     Left lower leg: No edema.  Lymphadenopathy:     Cervical: No cervical adenopathy.  Skin:    General: Skin is warm and dry.     Coloration: Skin is not pale.  Neurological:     General: No focal deficit present.     Mental Status: He is alert.  Psychiatric:        Mood and Affect: Mood normal.        Behavior: Behavior normal.     Lab Results  Component Value Date   WBC 8.8 11/06/2022   HGB 16.5 11/06/2022   HCT 46.4 11/06/2022   PLT 335.0 11/06/2022   GLUCOSE 123 (H) 01/08/2023   CHOL 133 03/29/2022   TRIG 117.0 03/29/2022   HDL 43.10 03/29/2022   LDLDIRECT 146.0 10/04/2021   LDLCALC 66 03/29/2022   ALT 40 11/06/2022   AST 31 11/06/2022   NA 141 01/08/2023   K  4.4 01/08/2023   CL 99 01/08/2023   CREATININE 0.81 01/08/2023   BUN 9 01/08/2023   CO2 31 01/08/2023   TSH 3.03 01/08/2023   PSA 0.94 11/06/2022   INR 1.11 12/29/2014   HGBA1C 5.7 11/06/2022    CT CARDIAC SCORING (SELF PAY ONLY)  Addendum Date: 12/10/2021   ADDENDUM REPORT: 12/10/2021 17:56 CLINICAL DATA:  Cardiovascular Disease Risk stratification EXAM: Coronary Calcium Score TECHNIQUE: A gated, non-contrast computed tomography scan of the heart was performed using 3mm slice thickness. Axial images were analyzed on a dedicated workstation. Calcium scoring of the coronary arteries was performed using the Agatston method. FINDINGS: Coronary Calcium Score: Left main: 0 Left anterior descending artery: 99 Left circumflex artery: 72 Right coronary artery: 0 Total: 171 Percentile: 89th Pericardium: Normal. Non-cardiac: See separate report from [facility]. IMPRESSION: Coronary calcium score of 171. This was 89th percentile for age-, race-, and sex-matched controls. RECOMMENDATIONS: Coronary artery calcium (CAC) score is a strong predictor of incident coronary heart disease (CHD) and provides predictive information beyond traditional risk factors. CAC scoring is reasonable to use in the decision to withhold, postpone, or initiate statin therapy in intermediate-risk or selected borderline-risk asymptomatic adults (age 14-75 years and LDL-C >=70 to <190 mg/dL) who do not have diabetes or established atherosclerotic cardiovascular disease (ASCVD).* In intermediate-risk (10-year ASCVD risk >=7.5% to <20%) adults or selected borderline-risk (10-year ASCVD risk >=5% to <7.5%) adults in whom a CAC score is measured for the purpose of making a treatment decision the following recommendations have been made: If CAC=0, it is reasonable to withhold statin therapy and reassess in 5 to 10 years, as long as higher risk conditions are absent (diabetes mellitus, family history of premature CHD in first degree  relatives (males <55 years; females <65 years), cigarette smoking, or LDL >=190 mg/dL). If CAC is 1 to 99, it is reasonable to initiate statin therapy for patients >=22 years of age. If CAC is >=100 or >=75th percentile, it is reasonable to initiate statin therapy at any age. Cardiology referral should be considered for patients with CAC scores >=400 or >=75th percentile. *2018 AHA/ACC/AACVPR/AAPA/ABC/ACPM/ADA/AGS/APhA/ASPC/NLA/PCNA Guideline on the Management of Blood Cholesterol: A Report of the American College of Cardiology/American Heart Association Task Force on Clinical Practice Guidelines. J Am Coll Cardiol. 2019;73(24):3168-3209. Lennie Odor, MD Electronically Signed   By: Lennie Odor M.D.   On: 12/10/2021 17:56   Result Date: 12/10/2021 EXAM: OVER-READ INTERPRETATION  CT CHEST The following report is an over-read performed by radiologist Dr. Jeronimo Greaves of [facility] on 12/09/2021. This over-read does not include interpretation of cardiac or coronary anatomy or pathology. The calcium score interpretation by the cardiologist is attached. COMPARISON:  10/04/2021 chest radiograph chest CT 12/09/2014 FINDINGS: Vascular: Normal aortic caliber. Mediastinum/Nodes: No imaged thoracic adenopathy. Lungs/Pleura: 2 mm calcified granuloma in the left lower lobe on 24/4. No pleural fluid. Upper Abdomen: Normal imaged portions of the liver, spleen, stomach. Musculoskeletal: No acute osseous abnormality. IMPRESSION: No acute findings in the imaged extracardiac chest. Electronically Signed: By: Jeronimo Greaves M.D. On: 12/09/2021 10:52    Assessment & Plan:   Essential hypertension- His blood pressure is adequately well-controlled.  Electrolytes and renal function are normal.  EKG is unchanged. -     Basic metabolic panel; Future -     EKG 12-Lead -     Indapamide; Take 1 tablet (1.25 mg total) by mouth daily.  Dispense: 90 tablet; Refill: 0  Pulmonary sarcoidosis (HCC)- NED -     Basic metabolic  panel; Future  DOE (dyspnea on exertion)- Will evaluate for cardiac ischemia. -     Troponin I (High Sensitivity); Future -     CT CORONARY MORPH W/CTA COR W/SCORE W/CA W/CM &/OR WO/CM; Future  Agatston coronary artery calcium score between 100 and 199 -     Troponin I (High Sensitivity); Future -     CT CORONARY MORPH W/CTA COR W/SCORE W/CA W/CM &/OR WO/CM; Future -     Rosuvastatin Calcium; Take 1 tablet (20 mg total) by mouth daily.  Dispense: 90 tablet; Refill: 1  Acquired hypothyroidism- He is euthyroid. -     TSH; Future  RBBB (right bundle branch block) -     CT CORONARY MORPH W/CTA COR W/SCORE W/CA W/CM &/OR WO/CM; Future  Hyperlipidemia LDL goal <100 -     Rosuvastatin Calcium; Take 1 tablet (20 mg total) by mouth daily.  Dispense: 90 tablet; Refill: 1     Follow-up: Return in about 6 months (around 07/11/2023).  Sanda Linger, MD

## 2023-01-11 ENCOUNTER — Telehealth (HOSPITAL_COMMUNITY): Payer: Self-pay | Admitting: *Deleted

## 2023-01-11 ENCOUNTER — Encounter (HOSPITAL_COMMUNITY): Payer: Self-pay

## 2023-01-11 ENCOUNTER — Other Ambulatory Visit (HOSPITAL_COMMUNITY): Payer: Self-pay | Admitting: *Deleted

## 2023-01-11 MED ORDER — INDAPAMIDE 1.25 MG PO TABS
1.2500 mg | ORAL_TABLET | Freq: Every day | ORAL | 0 refills | Status: DC
Start: 2023-01-11 — End: 2023-04-17

## 2023-01-11 MED ORDER — ROSUVASTATIN CALCIUM 20 MG PO TABS: 20.0000 mg | ORAL_TABLET | Freq: Every day | ORAL | 1 refills | Status: DC

## 2023-01-11 MED ORDER — METOPROLOL TARTRATE 100 MG PO TABS: ORAL_TABLET | ORAL | 0 refills | Status: DC

## 2023-01-11 NOTE — Telephone Encounter (Signed)
Attempted to call patient regarding upcoming cardiac CT appointment. °Left message on voicemail with name and callback number ° °Mercer Peifer RN Navigator Cardiac Imaging °[facility] °[phone] Office °[phone] Cell ° °

## 2023-01-12 ENCOUNTER — Telehealth (HOSPITAL_COMMUNITY): Payer: Self-pay | Admitting: *Deleted

## 2023-01-12 NOTE — Telephone Encounter (Signed)
Reaching out to patient to offer assistance regarding upcoming cardiac imaging study; pt verbalizes understanding of appt date/time, parking situation and where to check in, pre-test NPO status and medications ordered, and verified current allergies; name and call back number provided for further questions should they arise  Darsha Zumstein RN Navigator Cardiac Imaging [facility] [phone] office [phone] cell  Patient to take 100mg metoprolol tartrate two hours prior to his cardiac CT scan. He is aware to arrive at 7:30am. 

## 2023-01-15 ENCOUNTER — Ambulatory Visit (HOSPITAL_COMMUNITY): Admission: RE | Admit: 2023-01-15 | Payer: 59 | Source: Ambulatory Visit

## 2023-01-22 ENCOUNTER — Ambulatory Visit (HOSPITAL_COMMUNITY)
Admission: RE | Admit: 2023-01-22 | Discharge: 2023-01-22 | Disposition: A | Discharge status: Home / Self Care | Payer: 59 | Source: Ambulatory Visit | Attending: Internal Medicine | Admitting: Internal Medicine

## 2023-01-22 ENCOUNTER — Other Ambulatory Visit: Payer: Self-pay | Admitting: Internal Medicine

## 2023-01-22 DIAGNOSIS — R0609 Other forms of dyspnea: Secondary | ICD-10-CM | POA: Diagnosis not present

## 2023-01-22 DIAGNOSIS — I2510 Atherosclerotic heart disease of native coronary artery without angina pectoris: Secondary | ICD-10-CM | POA: Diagnosis not present

## 2023-01-22 DIAGNOSIS — R931 Abnormal findings on diagnostic imaging of heart and coronary circulation: Secondary | ICD-10-CM

## 2023-01-22 DIAGNOSIS — I4510 Unspecified right bundle-branch block: Secondary | ICD-10-CM

## 2023-01-22 MED ORDER — IOHEXOL 350 MG/ML SOLN
95.0000 mL | Freq: Once | INTRAVENOUS | Status: AC | PRN
  Administered 2023-01-22: 95 mL via INTRAVENOUS

## 2023-01-22 MED ORDER — NITROGLYCERIN 0.4 MG SL SUBL
SUBLINGUAL_TABLET | SUBLINGUAL | Status: AC
  Filled 2023-01-22: qty 2

## 2023-01-22 MED ORDER — NITROGLYCERIN 0.4 MG SL SUBL
0.8000 mg | SUBLINGUAL_TABLET | SUBLINGUAL | Status: DC | PRN
  Administered 2023-01-22: 0.8 mg via SUBLINGUAL

## 2023-01-22 MED ORDER — ASPIRIN 81 MG PO TBEC
81.0000 mg | DELAYED_RELEASE_TABLET | Freq: Every day | ORAL | 12 refills | Status: DC
Start: 2023-01-22 — End: 2024-01-31

## 2023-02-02 ENCOUNTER — Other Ambulatory Visit: Payer: Self-pay | Admitting: Internal Medicine

## 2023-02-02 DIAGNOSIS — I10 Essential (primary) hypertension: Secondary | ICD-10-CM

## 2023-02-26 DIAGNOSIS — Z0100 Encounter for examination of eyes and vision without abnormal findings: Secondary | ICD-10-CM | POA: Diagnosis not present

## 2023-03-22 NOTE — Progress Notes (Signed)
Completed.

## 2023-04-15 ENCOUNTER — Other Ambulatory Visit: Payer: Self-pay | Admitting: Internal Medicine

## 2023-04-15 DIAGNOSIS — E039 Hypothyroidism, unspecified: Secondary | ICD-10-CM

## 2023-04-17 ENCOUNTER — Other Ambulatory Visit: Payer: Self-pay | Admitting: Internal Medicine

## 2023-04-17 DIAGNOSIS — I10 Essential (primary) hypertension: Secondary | ICD-10-CM

## 2023-04-22 NOTE — Progress Notes (Unsigned)
Cardiology Office Note:   Date:  04/23/2023  NAME:  Chase Mcclure    MRN: [id number] DOB:  [birth year]   PCP:  Etta Grandchild, MD  Cardiologist:  Reatha Harps, MD  Electrophysiologist:  None   Referring MD: Etta Grandchild, MD   Chief Complaint  Patient presents with   Coronary Artery Disease    History of Present Illness:   Chase Mcclure is a 52 y.o. male with a hx of CAD who is being seen today for the evaluation of CAD at the request of Etta Grandchild, MD. referred for nonobstructive CAD.  Reports no chest pains or trouble breathing.  Cholesterol level is at goal.  Describes occasional right-sided chest discomfort but it seems to be stress related.  His blood pressure is elevated today.  He is on amlodipine and indapamide.  Tells me values are controlled at home.  He snores and is fatigued.  He may have sleep apnea.  His wife reports he has been observed to stop breathing.  He does have pulmonary sarcoidosis but has not been evaluated by a pulmonologist in quite some time.  We did discuss seeing a pulmonary doctor.  He is not exercising.  He reports he does have erectile dysfunction.  He has been seen by urology.  We did discuss the relationship between sleep apnea and erectile dysfunction.  I have recommended he be evaluated by pulmonologist for treatment of pulmonary sarcoidosis as well as sleep study.  Labs are unremarkable.  CV exam normal.  EKG shows incomplete right bundle branch block which is likely a normal finding.  He reports he needs to start exercising.  He does drink several alcoholic beverages per night.  We did discuss to back off on this.  He also reports no smoking or drug use.  Works in Administrator, arts.  He has 3 children.  Presents with his wife.  He does have a family history of heart disease.  We did discuss that his risk factors are well-controlled.  Problem List CAD -25-49% LAD -CAC 128 (85th percentile) 2. HLD -T chol 133, HDL 43, LDL 66, TG 117 3.  Pulmonary sarcoidosis   Past Medical History: Past Medical History:  Diagnosis Date   Anxiety    Celiac disease    Coronary artery disease    Diverticulitis    per PET scan asymptomatic   Hypertension    diet and exercise controlled.  Also has white coat syndrome.   Shortness of breath dyspnea    recently    Past Surgical History: Past Surgical History:  Procedure Laterality Date   COLONOSCOPY     HERNIA REPAIR Left 2000ish   HERNIA REPAIR Left 2015   INGUINAL HERNIA REPAIR Right 04/30/2014   Procedure: LAPAROSCOPIC RIGHT  INGUINAL HERNIA REPAIR ;  Surgeon: Shelly Rubenstein, MD;  Location: [facility]S;  Service: General;  Laterality: Right;   INSERTION OF MESH N/A 04/30/2014   Procedure: INSERTION OF MESH;  Surgeon: Shelly Rubenstein, MD;  Location: [facility];  Service: General;  Laterality: N/A;   KNEE ARTHROSCOPY W/ MENISCAL REPAIR Right    MEDIASTINOSCOPY N/A 12/29/2014   Procedure: MEDIASTINOSCOPY;  Surgeon: Kerin Perna, MD;  [facility];  Service: Thoracic;  Laterality: N/A;   RETINOPATHY SURGERY     Serous   trauma surgery      punctured lung lacerated spleen    VASECTOMY     VIDEO BRONCHOSCOPY WITH ENDOBRONCHIAL ULTRASOUND N/A 12/29/2014   Procedure:  VIDEO BRONCHOSCOPY WITH ENDOBRONCHIAL ULTRASOUND;  Surgeon: Kerin Perna, MD;  Location: [facility] OR;  Service: Thoracic;  Laterality: N/A;    Current Medications: Current Meds  Medication Sig   amLODipine (NORVASC) 5 MG tablet TAKE 1 TABLET (5 MG TOTAL) BY MOUTH DAILY.   aspirin EC 81 MG tablet Take 1 tablet (81 mg total) by mouth daily. Swallow whole.   cetirizine (ZYRTEC) 10 MG tablet Take 10 mg by mouth daily.   indapamide (LOZOL) 1.25 MG tablet TAKE 1 TABLET BY MOUTH DAILY.   levothyroxine (SYNTHROID) 25 MCG tablet TAKE 1 TABLET BY MOUTH DAILY BEFORE BREAKFAST.   metoprolol tartrate (LOPRESSOR) 100 MG tablet Take tablet (100mg ) TWO hours prior to your cardiac CT scan.   rosuvastatin (CRESTOR) 20 MG tablet Take 1  tablet (20 mg total) by mouth daily.   Vitamin D, Ergocalciferol, 2000 units CAPS Take 2 capsules by mouth daily.     Allergies:    Gluten meal   Social History: Social History   Socioeconomic History   Marital status: Married    Spouse name: Not on file   Number of children: 3   Years of education: Not on file   Highest education level: Associate degree: occupational, Scientist, product/process development, or vocational program  Occupational History   Occupation: Government social research officer  Tobacco Use   Smoking status: Former    Current packs/day: 0.00    Average packs/day: 0.3 packs/day for 1 year (0.3 ttl pk-yrs)    Types: Cigarettes    Start date: 09/05/1987    Quit date: 09/04/1988    Years since quitting: 34.6   Smokeless tobacco: Former    Types: Chew    Quit date: 09/04/1998  Vaping Use   Vaping status: Former  Substance and Sexual Activity   Alcohol use: Yes    Alcohol/week: 2.0 standard drinks of alcohol    Types: 2 Glasses of wine per week    Comment: once a month   Drug use: No   Sexual activity: Yes    Partners: Female  Other Topics Concern   Not on file  Social History Narrative   Very active job   Social Determinants of Health   Financial Resource Strain: Low Risk  (01/08/2023)   Overall Financial Resource Strain (CARDIA)    Difficulty of Paying Living Expenses: Not hard at all  Food Insecurity: No Food Insecurity (01/08/2023)   Hunger Vital Sign    Worried About Running Out of Food in the Last Year: Never true    Ran Out of Food in the Last Year: Never true  Transportation Needs: No Transportation Needs (01/08/2023)   PRAPARE - Administrator, Civil Service (Medical): No    Lack of Transportation (Non-Medical): No  Physical Activity: Insufficiently Active (01/08/2023)   Exercise Vital Sign    Days of Exercise per Week: 3 days    Minutes of Exercise per Session: 30 min  Stress: No Stress Concern Present (01/08/2023)   [employer] of Occupational Health - Occupational Stress  Questionnaire    Feeling of Stress : Only a little  Social Connections: Unknown (04/15/2023)   Received from [facility]   Social Network    Social Network: Not on file     Family History: The patient's family history includes Aneurysm in his mother; Arrhythmia in his father; Cancer in his maternal aunt; Colon polyps in his father; Dementia in his father; Diabetes in his daughter; Drug abuse in his mother; Hearing loss in his brother and  father; Heart attack in his father; Hypertension in his brother and father; Prostate cancer (age of onset: 35) in his father; Rheum arthritis in his father; Stroke (age of onset: 73) in his father.  ROS:   All other ROS reviewed and negative. Pertinent positives noted in the HPI.     EKGs/Labs/Other Studies Reviewed:   The following studies were personally reviewed by me today:  EKG:  EKG is not ordered today.  EKG reviewed from primary care physician dated 01/08/2023 present EKG demonstrates normal sinus rhythm heart 72 with incomplete right bundle branch block.      Recent Labs: 11/06/2022: ALT 40; Hemoglobin 16.5; Platelets 335.0 01/08/2023: BUN 9; Creatinine, Ser 0.81; Potassium 4.4; Sodium 141; TSH 3.03   Recent Lipid Panel    Component Value Date/Time   CHOL 133 03/29/2022 1136   TRIG 117.0 03/29/2022 1136   HDL 43.10 03/29/2022 1136   CHOLHDL 3 03/29/2022 1136   VLDL 23.4 03/29/2022 1136   LDLCALC 66 03/29/2022 1136   LDLDIRECT 146.0 10/04/2021 0854    Physical Exam:   VS:  BP (!) 152/98 (BP Location: Left Arm, Patient Position: Sitting, Cuff Size: Large)   Pulse 74   Ht 6' (1.829 m)   Wt 240 lb 3.2 oz (109 kg)   SpO2 96%   BMI 32.58 kg/m    Wt Readings from Last 3 Encounters:  04/23/23 240 lb 3.2 oz (109 kg)  01/08/23 225 lb (102.1 kg)  11/06/22 237 lb (107.5 kg)    General: Well nourished, well developed, in no acute distress Head: Atraumatic, normal size  Eyes: PEERLA, EOMI  Neck: Supple, no JVD Endocrine: No  thryomegaly Cardiac: Normal S1, S2; RRR; no murmurs, rubs, or gallops Lungs: Clear to auscultation bilaterally, no wheezing, rhonchi or rales  Abd: Soft, nontender, no hepatomegaly  Ext: No edema, pulses 2+ Musculoskeletal: No deformities, BUE and BLE strength normal and equal Skin: Warm and dry, no rashes   Neuro: Alert and oriented to person, place, time, and situation, CNII-XII grossly intact, no focal deficits  Psych: Normal mood and affect   ASSESSMENT:   AKZEL WALTERS is a 52 y.o. male who presents for the following: 1. Coronary artery disease involving native coronary artery of native heart without angina pectoris   2. Agatston coronary artery calcium score between 100 and 199   3. Mixed hyperlipidemia   4. Pulmonary sarcoidosis (HCC)   5. Snoring   6. Other fatigue     PLAN:   1. Coronary artery disease involving native coronary artery of native heart without angina pectoris 2. Agatston coronary artery calcium score between 100 and 199 3. Mixed hyperlipidemia -Nonobstructive CAD.  EKG with incomplete right bundle branch block.  This is normal.  LDL at goal.  On aspirin.  Blood pressure could be a bit better.  I suspect he has sleep apnea.  He also has erectile dysfunction but good 2+ pulses in the lower extremity.  I do not believe this is a vascular issue.  We did discuss relationship with possible sleep apnea and erectile dysfunction.  I think he should be screened for this.  Needs to see pulmonologist anyway.  Regarding CAD numbers are well-controlled.  No change in blood pressure medications.  4. Pulmonary sarcoidosis [facility]) -Referral to pulmonary, Mechele Collin.  5. Snoring 6. Other fatigue -He screens positive for sleep apnea.  Suspect this is a big contributor to blood pressure.  Possibly contributing to erectile dysfunction.  I have recommended he undergo  a sleep study.  He needs to see pulmonary anyway.  They can set him up for this.  Disposition: Return in about 1  year (around 04/22/2024).  Medication Adjustments/Labs and Tests Ordered: Current medicines are reviewed at length with the patient today.  Concerns regarding medicines are outlined above.  Orders Placed This Encounter  Procedures   Ambulatory referral to Pulmonology   No orders of the defined types were placed in this encounter.  Patient Instructions  Medication Instructions:  Your physician recommends that you continue on your current medications as directed. Please refer to the Current Medication list given to you today.  *If you need a refill on your cardiac medications before your next appointment, please call your pharmacy*    Follow-Up: At [facility], you and your health needs are our priority.  As part of our continuing mission to provide you with exceptional heart care, we have created designated Provider Care Teams.  These Care Teams include your primary Cardiologist (physician) and Advanced Practice Providers (APPs -  Physician Assistants and Nurse Practitioners) who all work together to provide you with the care you need, when you need it.  We recommend signing up for the patient portal called "MyChart".  Sign up information is provided on this After Visit Summary.  MyChart is used to connect with patients for Virtual Visits (Telemedicine).  Patients are able to view lab/test results, encounter notes, upcoming appointments, etc.  Non-urgent messages can be sent to your provider as well.   To learn more about what you can do with MyChart, go to [email or web address].    Your next appointment:   1 year(s)  Provider:   Reatha Harps, MD    Other Instructions You have been referred to Dr. Mechele Collin at [facility].     Signed, Lenna Gilford. Flora Lipps, MD, [facility]  [facility]  [address], [address] [place], [place] [address] [phone]  04/23/2023 9:58 AM

## 2023-04-23 ENCOUNTER — Ambulatory Visit: Payer: 59 | Attending: Cardiovascular Disease | Admitting: Cardiovascular Disease

## 2023-04-23 ENCOUNTER — Encounter: Payer: Self-pay | Admitting: Cardiovascular Disease

## 2023-04-23 VITALS — BP 152/98 | HR 74 | Ht 72.0 in | Wt 240.2 lb

## 2023-04-23 DIAGNOSIS — R0683 Snoring: Secondary | ICD-10-CM

## 2023-04-23 DIAGNOSIS — E782 Mixed hyperlipidemia: Secondary | ICD-10-CM | POA: Diagnosis not present

## 2023-04-23 DIAGNOSIS — R5383 Other fatigue: Secondary | ICD-10-CM

## 2023-04-23 DIAGNOSIS — I2510 Atherosclerotic heart disease of native coronary artery without angina pectoris: Secondary | ICD-10-CM

## 2023-04-23 DIAGNOSIS — D860 Sarcoidosis of lung: Secondary | ICD-10-CM

## 2023-04-23 DIAGNOSIS — R931 Abnormal findings on diagnostic imaging of heart and coronary circulation: Secondary | ICD-10-CM | POA: Diagnosis not present

## 2023-04-23 NOTE — Patient Instructions (Signed)
Medication Instructions:  Your physician recommends that you continue on your current medications as directed. Please refer to the Current Medication list given to you today.  *If you need a refill on your cardiac medications before your next appointment, please call your pharmacy*    Follow-Up: At [facility], you and your health needs are our priority.  As part of our continuing mission to provide you with exceptional heart care, we have created designated Provider Care Teams.  These Care Teams include your primary Cardiologist (physician) and Advanced Practice Providers (APPs -  Physician Assistants and Nurse Practitioners) who all work together to provide you with the care you need, when you need it.  We recommend signing up for the patient portal called "MyChart".  Sign up information is provided on this After Visit Summary.  MyChart is used to connect with patients for Virtual Visits (Telemedicine).  Patients are able to view lab/test results, encounter notes, upcoming appointments, etc.  Non-urgent messages can be sent to your provider as well.   To learn more about what you can do with MyChart, go to [email or web address].    Your next appointment:   1 year(s)  Provider:   Reatha Harps, MD    Other Instructions You have been referred to Dr. Mechele Collin at [facility].

## 2023-04-25 ENCOUNTER — Other Ambulatory Visit: Payer: Self-pay | Admitting: Internal Medicine

## 2023-04-25 DIAGNOSIS — I10 Essential (primary) hypertension: Secondary | ICD-10-CM

## 2023-06-18 ENCOUNTER — Encounter (HOSPITAL_BASED_OUTPATIENT_CLINIC_OR_DEPARTMENT_OTHER): Payer: Self-pay | Admitting: Pulmonary Disease

## 2023-06-18 ENCOUNTER — Ambulatory Visit (INDEPENDENT_AMBULATORY_CARE_PROVIDER_SITE_OTHER): Payer: 59 | Admitting: Pulmonary Disease

## 2023-06-18 VITALS — BP 140/88 | HR 77 | Resp 16 | Ht 72.0 in | Wt 245.6 lb

## 2023-06-18 DIAGNOSIS — E039 Hypothyroidism, unspecified: Secondary | ICD-10-CM

## 2023-06-18 DIAGNOSIS — G4719 Other hypersomnia: Secondary | ICD-10-CM | POA: Diagnosis not present

## 2023-06-18 NOTE — Progress Notes (Deleted)
Subjective:   PATIENT ID: Chase Mcclure GENDER: male DOB: 04-13-1971, MRN: [id number]  No chief complaint on file.   Reason for Visit: ***Follow-up ***New consult for ***  *** Social History:  Environmental exposures: ***  I have personally reviewed patient's past medical/family/social history, allergies, current medications.***  Past Medical History:  Diagnosis Date   Anxiety    Celiac disease    Coronary artery disease    Diverticulitis    per PET scan asymptomatic   Hypertension    diet and exercise controlled.  Also has white coat syndrome.   Shortness of breath dyspnea    recently     Family History  Problem Relation Age of Onset   Aneurysm Mother        brain   Drug abuse Mother    Arrhythmia Father    Heart attack Father    Rheum arthritis Father    Prostate cancer Father 59   Stroke Father 56       preceded bymultiple TIAs   Hypertension Father    Hearing loss Father    Dementia Father    Colon polyps Father        late 51s.   Hearing loss Brother    Hypertension Brother    Cancer Maternal Aunt        x2   Diabetes Daughter      Social History   Occupational History   Occupation: Government social research officer  Tobacco Use   Smoking status: Former    Current packs/day: 0.00    Average packs/day: 0.3 packs/day for 1 year (0.3 ttl pk-yrs)    Types: Cigarettes    Start date: 09/05/1987    Quit date: 09/04/1988    Years since quitting: 34.8   Smokeless tobacco: Former    Types: Chew    Quit date: 09/04/1998  Vaping Use   Vaping status: Former  Substance and Sexual Activity   Alcohol use: Yes    Alcohol/week: 2.0 standard drinks of alcohol    Types: 2 Glasses of wine per week    Comment: once a month   Drug use: No   Sexual activity: Yes    Partners: Female    Allergies  Allergen Reactions   Gluten Meal Other (See Comments)    Celiac disease     Outpatient Medications Prior to Visit  Medication Sig Dispense Refill   amLODipine (NORVASC) 5  MG tablet TAKE 1 TABLET (5 MG TOTAL) BY MOUTH DAILY. 30 tablet 2   aspirin EC 81 MG tablet Take 1 tablet (81 mg total) by mouth daily. Swallow whole. 30 tablet 12   cetirizine (ZYRTEC) 10 MG tablet Take 10 mg by mouth daily.     indapamide (LOZOL) 1.25 MG tablet TAKE 1 TABLET BY MOUTH DAILY. 30 tablet 2   levothyroxine (SYNTHROID) 25 MCG tablet TAKE 1 TABLET BY MOUTH DAILY BEFORE BREAKFAST. 30 tablet 3   metoprolol tartrate (LOPRESSOR) 100 MG tablet Take tablet (100mg ) TWO hours prior to your cardiac CT scan. 1 tablet 0   rosuvastatin (CRESTOR) 20 MG tablet Take 1 tablet (20 mg total) by mouth daily. 90 tablet 1   Vitamin D, Ergocalciferol, 2000 units CAPS Take 2 capsules by mouth daily. 30 capsule 0   No facility-administered medications prior to visit.    ROS   Objective:  There were no vitals filed for this visit.    Physical Exam: General: Well-appearing, no acute distress HENT: [place], AT Eyes: EOMI, no scleral icterus  Respiratory: ***Clear to auscultation bilaterally.  No crackles, wheezing or rales Cardiovascular: RRR, -M/R/G, no JVD Extremities:-Edema,-tenderness Neuro: AAO x4, CNII-XII grossly intact Psych: Normal mood, normal affect  Data Reviewed:  Imaging:  PFT:  Labs:      Assessment & Plan:   Discussion: HPI  We discussed the clinical course of sarcoid and management including serial PFTs, labs, eye exam, and EKG and chest imaging if indicated. If symptoms suggest sarcoid flare in the future, we would manage with steroids +/- biologics.  Pulmonary sarcoidosis --Dx in *** via endobronchial bx*** --PET/CT*** --Cardiac MRI***  History of immunosuppression High risk medication management --QuantiFERON-TB*** --Prednisone/methotrexate/cellcept/azathioprine/infliximab --ORDER CBC and CMET  --Recommend PPI daily when taking steroids  Sarcoid Monitoring --Recent chest imaging reviewed. *** --Annual PFTs.  Last PFTs *** --Annual ophthalmology exam.  Last  visit on *** --Recent EKG and TTE reviewed. No evidence of conduction abnormalities.***  --Routine labs as needed: CBC with diff, CMET, 1, 25 and 25 hydroxy vitamin D, urinary calcium ***  Health Maintenance Immunization History  Administered Date(s) Administered   Influenza,inj,Quad PF,6+ Mos 05/25/2017, 08/12/2018, 05/28/2019   Influenza-Unspecified 06/04/2014   Janssen (J&J) SARS-COV-2 Vaccination 12/10/2019   Tdap 10/23/2017   Zoster Recombinant(Shingrix) 10/04/2021, 12/21/2021   CT Lung Screen***  No orders of the defined types were placed in this encounter. No orders of the defined types were placed in this encounter.   No follow-ups on file.  I have spent a total time of***-minutes on the day of the appointment reviewing prior documentation, coordinating care and discussing medical diagnosis and plan with the patient/family. Imaging, labs and tests included in this note have been reviewed and interpreted independently by me.  Lalita Ebel Mechele Collin, MD [facility] 06/18/2023 8:02 AM

## 2023-06-18 NOTE — Progress Notes (Signed)
Subjective:   PATIENT ID: Chase Mcclure GENDER: male DOB: Jul 25, 1971, MRN: [id number]  No chief complaint on file.   Reason for Visit: Pulmonary Sarcoidosis, new consult New consult for Dr. Yetta Barre  Social History:8 pack year smoking history per chart review  Environmental exposures: NA  I have personally reviewed patient's past medical/family/social history, allergies, current medications.     Past Medical History:  Diagnosis Date   Anxiety    Celiac disease    Coronary artery disease    Diverticulitis    per PET scan asymptomatic   Hypertension    diet and exercise controlled.  Also has white coat syndrome.   Shortness of breath dyspnea    recently     Family History  Problem Relation Age of Onset   Aneurysm Mother        brain   Drug abuse Mother    Arrhythmia Father    Heart attack Father    Rheum arthritis Father    Prostate cancer Father 22   Stroke Father 29       preceded bymultiple TIAs   Hypertension Father    Hearing loss Father    Dementia Father    Colon polyps Father        late 89s.   Hearing loss Brother    Hypertension Brother    Cancer Maternal Aunt        x2   Diabetes Daughter      Social History   Occupational History   Occupation: Government social research officer  Tobacco Use   Smoking status: Former    Current packs/day: 0.00    Average packs/day: 0.3 packs/day for 1 year (0.3 ttl pk-yrs)    Types: Cigarettes    Start date: 09/05/1987    Quit date: 09/04/1988    Years since quitting: 34.8   Smokeless tobacco: Former    Types: Chew    Quit date: 09/04/1998  Vaping Use   Vaping status: Former  Substance and Sexual Activity   Alcohol use: Yes    Alcohol/week: 2.0 standard drinks of alcohol    Types: 2 Glasses of wine per week    Comment: once a month   Drug use: No   Sexual activity: Yes    Partners: Female    Allergies  Allergen Reactions   Gluten Meal Other (See Comments)    Celiac disease     Outpatient Medications Prior  to Visit  Medication Sig Dispense Refill   amLODipine (NORVASC) 5 MG tablet TAKE 1 TABLET (5 MG TOTAL) BY MOUTH DAILY. 30 tablet 2   aspirin EC 81 MG tablet Take 1 tablet (81 mg total) by mouth daily. Swallow whole. 30 tablet 12   cetirizine (ZYRTEC) 10 MG tablet Take 10 mg by mouth daily.     indapamide (LOZOL) 1.25 MG tablet TAKE 1 TABLET BY MOUTH DAILY. 30 tablet 2   levothyroxine (SYNTHROID) 25 MCG tablet TAKE 1 TABLET BY MOUTH DAILY BEFORE BREAKFAST. 30 tablet 3   metoprolol tartrate (LOPRESSOR) 100 MG tablet Take tablet (100mg ) TWO hours prior to your cardiac CT scan. 1 tablet 0   rosuvastatin (CRESTOR) 20 MG tablet Take 1 tablet (20 mg total) by mouth daily. 90 tablet 1   Vitamin D, Ergocalciferol, 2000 units CAPS Take 2 capsules by mouth daily. 30 capsule 0   No facility-administered medications prior to visit.    ROS   Objective:  There were no vitals filed for this visit.    Physical Exam:  General: Well-appearing, no acute distress HENT: [place], AT Eyes: EOMI, no scleral icterus Respiratory: Clear to auscultation bilaterally.  No crackles, wheezing or rales Cardiovascular: RRR, -M/R/G Extremities:-Edema,-tenderness Neuro: AAO x4, CNII-XII grossly intact Psych: Normal mood, normal affect  Data Reviewed:  Imaging:  CT CORONARY MORPH W/CTA COR W/SCORE W/CA W/CM &/OR WO/CM  Addendum Date: 01/25/2023   ADDENDUM REPORT: 01/25/2023 17:14 EXAM: OVER-READ INTERPRETATION  CT CHEST The following report is an over-read performed by radiologist Dr. Maryelizabeth Rowan [facility] on 01/25/2023. This over-read does not include interpretation of cardiac or coronary anatomy or pathology. The CTA interpretation by the cardiologist is attached. COMPARISON:  None. FINDINGS: Limited view of the lung parenchyma demonstrates no suspicious nodularity. Airways are normal. Limited view of the mediastinum demonstrates no adenopathy. Esophagus normal. Limited view of the upper abdomen  unremarkable. Limited view of the skeleton and chest wall is unremarkable. IMPRESSION: No significant extracardiac findings. Electronically Signed   By: Genevive Bi M.D.   On: 01/25/2023 17:14   Result Date: 01/25/2023 CLINICAL DATA:  Chest pain EXAM: Cardiac CTA MEDICATIONS: Sub lingual nitro. 4mg  x 2 TECHNIQUE: The patient was scanned on a Siemens 192 slice scanner. Gantry rotation speed was 250 msecs. Collimation was 0.6 mm. A 100 kV prospective scan was triggered in the ascending thoracic aorta at 35-75% of the R-R interval. Average HR during the scan was 60 bpm. The 3D data set was interpreted on a dedicated work station using MPR, MIP and VRT modes. A total of 80cc of contrast was used. FINDINGS: Non-cardiac: See separate report from [facility]. No LA appendage thrombus. Pulmonary veins drain normally to the left atrium. Calcium Score: 128 Agatston units. Coronary Arteries: Right dominant with no anomalies LM: No plaque or stenosis. LAD system: Calcified plaque proximal LAD, mild (25-49%) stenosis. Calcified plaque ostial D1, mild (25-49%) stenosis. Circumflex system: Calcified plaque proximal LCx, mild (1-24%) stenosis. RCA system: No plaque or stenosis. IMPRESSION: 1. Coronary artery calcium score 128 Agatston units. This places the patient in the 85th percentile for age and gender, suggesting high risk for future cardiac events. 2.  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 [employer] promotion account executive Electronically Signed: By: Marca Ancona M.D. On: 01/22/2023 13:31     PFT: FVC 4.25 (77%) FEV1 3.37 (78%) Ratio 101  TLC 84% DLCO 97% Interpretation: Mildly restrictive pattern   Labs:     Latest Ref Rng & Units 11/06/2022    9:12 AM 10/04/2021    8:54 AM 09/10/2021    3:11 PM  CBC  WBC 4.0 - 10.5 K/uL 8.8  8.2  13.9   Hemoglobin 13.0 - 17.0 g/dL 16.1  09.6  04.5   Hematocrit 39.0 - 52.0 % 46.4  45.9  49.2   Platelets 150.0 - 400.0 K/uL 335.0  278.0  223       Latest Ref Rng & Units 01/08/2023     8:51 AM 11/06/2022    9:12 AM 03/29/2022   11:36 AM  BMP  Glucose 70 - 99 mg/dL 409  811  914   BUN 6 - 23 mg/dL 9  13  12    Creatinine 0.40 - 1.50 mg/dL 7.82  9.56  2.13   Sodium 135 - 145 mEq/L 141  139  137   Potassium 3.5 - 5.1 mEq/L 4.4  5.2 No hemolysis seen  4.6   Chloride 96 - 112 mEq/L 99  102  103   CO2 19 - 32 mEq/L 31  27  28  Calcium 8.4 - 10.5 mg/dL 29.5  62.1  9.7       Latest Ref Rng & Units 11/06/2022    9:12 AM 10/04/2021    8:54 AM 09/10/2021    3:11 PM  Hepatic Function  Total Protein 6.0 - 8.3 g/dL 7.4  7.2  8.1   Albumin 3.5 - 5.2 g/dL 4.5  4.4  4.7   AST 0 - 37 U/L 31  23  33   ALT 0 - 53 U/L 40  34  41   Alk Phosphatase 39 - 117 U/L 75  75  60   Total Bilirubin 0.2 - 1.2 mg/dL 1.0  0.9  1.0   Bilirubin, Direct 0.0 - 0.3 mg/dL 0.2  0.1           Assessment & Plan:   Discussion: Mr. Wieman is a 52 year old male with past medical history of non-obstructive CAD followed by Dr. Flora Lipps, and sarcoidosis diagnosed in 2016 per tracheal lymph node biopsy who presents for consult regarding his sarcoidosis. In 2016, he endorsed having worsening shortness of breath with activity, as well as diffuse joint pains. He subsequently went to his PCP, who sent him to rheumatology for autoimmune workup. AI workup was negative, however chest x-ray at the time did show bilateral hilar adenopathy. Biopsy confirmed sarcoidosis.   Currently, he endorses extreme fatigue as well as snoring at night. His epworth scale is 15, and stop-bang is 4. Endorses some shortness of breath, but able to complete most ADLs and IADLs. No wheezing or coughing.   He did see Dr. Shelle Iron of Pulmonology in 2016, and at that time was on steroid treatment initiated by rheumatology during autoimmune workup. At that time, pt was asymptomatic, and did not require advanced medications.       #Pulmonary Sarcoidosis  - Does endorse some intermittent dyspnea and fatigue - Asymptomatic but does endorse fatigue,  which could be due to OSA - No CT scan at this time given last one 01/22/2023 which was within normal limits.   #Fatigue, likely OSA - Stop- BANG score: 4 - Positive for sleep apnea screening per cardiology visit on 04/23/23 - Epworth scale 15 - Will place referral for sleep study, low suspicion for sarcoidosis as driving factor for fatigue at this time.   #Hypothyroidism - Started on thyroid medication in March, TSH at that time was 4.95, and most recently in May of this year was 3.03. Will recheck today.   Health Maintenance Immunization History  Administered Date(s) Administered   Influenza,inj,Quad PF,6+ Mos 05/25/2017, 08/12/2018, 05/28/2019   Influenza-Unspecified 06/04/2014   Janssen (J&J) SARS-COV-2 Vaccination 12/10/2019   Tdap 10/23/2017   Zoster Recombinant(Shingrix) 10/04/2021, 12/21/2021   CT Lung Screen  12/09/2014: Adenopathy within mediastinum, hila, and in periportal region  No orders of the defined types were placed in this encounter. No orders of the defined types were placed in this encounter.   No follow-ups on file.  I have spent a total time of 30-minutes on the day of the appointment reviewing prior documentation, coordinating care and discussing medical diagnosis and plan with the patient/family. Imaging, labs and tests included in this note have been reviewed and interpreted independently by me.  Olegario Messier, MD [facility] 06/18/2023 8:11 AM

## 2023-06-18 NOTE — Patient Instructions (Addendum)
#  Fatigue, likely OSA - Stop- BANG score: 4 - Positive for sleep apnea screening per cardiology visit on 04/23/23 - Will place referral for sleep study  - Order TSH, T4

## 2023-06-19 ENCOUNTER — Other Ambulatory Visit: Payer: Self-pay | Admitting: Internal Medicine

## 2023-06-19 DIAGNOSIS — I10 Essential (primary) hypertension: Secondary | ICD-10-CM

## 2023-06-19 LAB — TSH: TSH: 3.38 u[IU]/mL (ref 0.450–4.500)

## 2023-06-19 LAB — T4: T4, Total: 6.8 ug/dL (ref 4.5–12.0)

## 2023-06-25 ENCOUNTER — Ambulatory Visit: Payer: 59

## 2023-06-25 DIAGNOSIS — G4733 Obstructive sleep apnea (adult) (pediatric): Secondary | ICD-10-CM | POA: Diagnosis not present

## 2023-06-25 DIAGNOSIS — G4719 Other hypersomnia: Secondary | ICD-10-CM

## 2023-06-27 ENCOUNTER — Telehealth: Payer: Self-pay | Admitting: Pulmonary Disease

## 2023-06-27 DIAGNOSIS — G4733 Obstructive sleep apnea (adult) (pediatric): Secondary | ICD-10-CM | POA: Diagnosis not present

## 2023-06-27 NOTE — Telephone Encounter (Signed)
Call patient  Sleep study result  Date of study: 06/25/2023  Impression: Severe obstructive sleep apnea with moderate oxygen desaturations  Recommendation: DME referral  Recommend CPAP therapy for severe obstructive sleep apnea  Auto titrating CPAP with pressure settings of 5-20 will be appropriate  Encourage weight loss measures  Follow-up in the office 4 to 6 weeks following initiation of treatment

## 2023-06-28 ENCOUNTER — Other Ambulatory Visit: Payer: Self-pay | Admitting: Internal Medicine

## 2023-06-28 DIAGNOSIS — I10 Essential (primary) hypertension: Secondary | ICD-10-CM

## 2023-07-04 ENCOUNTER — Encounter: Payer: Self-pay | Admitting: *Deleted

## 2023-07-04 NOTE — Telephone Encounter (Signed)
Please order autoCPAP 5-20 cm H20 and supplies. Patient is a new start

## 2023-07-04 NOTE — Addendum Note (Signed)
Addended by: Christen Butter on: 07/04/2023 10:19 AM   Modules accepted: Orders

## 2023-07-04 NOTE — Telephone Encounter (Signed)
CPAP ordered

## 2023-07-10 ENCOUNTER — Encounter: Payer: Self-pay | Admitting: Adult Health

## 2023-07-10 ENCOUNTER — Ambulatory Visit (INDEPENDENT_AMBULATORY_CARE_PROVIDER_SITE_OTHER): Payer: 59 | Admitting: Adult Health

## 2023-07-10 VITALS — BP 134/100 | HR 93

## 2023-07-10 DIAGNOSIS — G4733 Obstructive sleep apnea (adult) (pediatric): Secondary | ICD-10-CM | POA: Insufficient documentation

## 2023-07-10 NOTE — Progress Notes (Signed)
@Patient  ID: Chase Mcclure, male    DOB: 1971/05/13, 52 y.o.   MRN: [id number]  Chief Complaint  Patient presents with   Follow-up    Referring provider: Etta Grandchild, MD  HPI: 52 year old male followed for pulmonary sarcoid and sleep apnea   TEST/EVENTS :   07/10/2023 Follow up : OSA  Patient presents for a follow-up visit to discuss sleep study results.  Patient recently had complaints of snoring and daytime sleepiness he was set up for a home sleep study that was completed on June 25, 2023.  This showed severe obstructive sleep apnea with an AHI at 32.7/hour and SpO2 low at 78%.  He has been recommended to start on CPAP therapy.  Patient says he wanted to discuss the study results and go over CPAP before starting this.  Patient education was given on sleep apnea and potential complications of untreated sleep apnea including cerebrovascular and cardiovascular complications such as congestive heart failure, heart arrhythmias, pulmonary hypertension and hypertension.  Patient would like to proceed on CPAP therapy.  Allergies  Allergen Reactions   Gluten Meal Other (See Comments)    Celiac disease   Prednisone Anxiety    Irritable    Immunization History  Administered Date(s) Administered   Influenza,inj,Quad PF,6+ Mos 05/25/2017, 08/12/2018, 05/28/2019   Influenza-Unspecified 06/04/2014   Janssen (J&J) SARS-COV-2 Vaccination 12/10/2019   Tdap 10/23/2017   Zoster Recombinant(Shingrix) 10/04/2021, 12/21/2021    Past Medical History:  Diagnosis Date   Anxiety    Celiac disease    Coronary artery disease    Diverticulitis    per PET scan asymptomatic   Hypertension    diet and exercise controlled.  Also has white coat syndrome.   Shortness of breath dyspnea    recently    Tobacco History: Social History   Tobacco Use  Smoking Status Former   Current packs/day: 0.00   Average packs/day: 0.3 packs/day for 1 year (0.3 ttl pk-yrs)   Types: Cigarettes   Start  date: 09/05/1987   Quit date: 09/04/1988   Years since quitting: 34.8  Smokeless Tobacco Former   Types: Chew   Quit date: 09/04/1998   Counseling given: Not Answered   Outpatient Medications Prior to Visit  Medication Sig Dispense Refill   amLODipine (NORVASC) 5 MG tablet TAKE 1 TABLET (5 MG TOTAL) BY MOUTH DAILY. 90 tablet 0   aspirin EC 81 MG tablet Take 1 tablet (81 mg total) by mouth daily. Swallow whole. 30 tablet 12   cetirizine (ZYRTEC) 10 MG tablet Take 10 mg by mouth daily.     indapamide (LOZOL) 1.25 MG tablet TAKE 1 TABLET BY MOUTH DAILY. 30 tablet 0   levothyroxine (SYNTHROID) 25 MCG tablet TAKE 1 TABLET BY MOUTH DAILY BEFORE BREAKFAST. 30 tablet 3   rosuvastatin (CRESTOR) 20 MG tablet Take 1 tablet (20 mg total) by mouth daily. 90 tablet 1   Vitamin D, Ergocalciferol, 2000 units CAPS Take 2 capsules by mouth daily. 30 capsule 0   No facility-administered medications prior to visit.     Review of Systems:   Constitutional:   No  weight loss, night sweats,  Fevers, chills, +fatigue, or  lassitude.  HEENT:   No headaches,  Difficulty swallowing,  Tooth/dental problems, or  Sore throat,                No sneezing, itching, ear ache, nasal congestion, post nasal drip,   CV:  No chest pain,  Orthopnea, PND, swelling in lower  extremities, anasarca, dizziness, palpitations, syncope.   GI  No heartburn, indigestion, abdominal pain, nausea, vomiting, diarrhea, change in bowel habits, loss of appetite, bloody stools.   Resp: No shortness of breath with exertion or at rest.  No excess mucus, no productive cough,  No non-productive cough,  No coughing up of blood.  No change in color of mucus.  No wheezing.  No chest wall deformity  Skin: no rash or lesions.  GU: no dysuria, change in color of urine, no urgency or frequency.  No flank pain, no hematuria   MS:  No joint pain or swelling.  No decreased range of motion.  No back pain.    Physical Exam  BP (!) 134/100 (BP  Location: Left Arm, Cuff Size: Large) Comment (BP Location): patient monitors bp closely at home and will recheck at home  Pulse 93   SpO2 97%   GEN: A/Ox3; pleasant , NAD, well nourished    HEENT:  [place]/AT,  EACs-clear, TMs-wnl, NOSE-clear, THROAT-clear, no lesions, no postnasal drip or exudate noted.  Class 4 MP airway   NECK:  Supple w/ fair ROM; no JVD; normal carotid impulses w/o bruits; no thyromegaly or nodules palpated; no lymphadenopathy.    RESP  Clear  P & A; w/o, wheezes/ rales/ or rhonchi. no accessory muscle use, no dullness to percussion  CARD:  RRR, no m/r/g, no peripheral edema, pulses intact, no cyanosis or clubbing.  GI:   Soft & nt; nml bowel sounds; no organomegaly or masses detected.   Musco: Warm bil, no deformities or joint swelling noted.   Neuro: alert, no focal deficits noted.    Skin: Warm, no lesions or rashes    Lab Results:  CBC    BNP No results found for: "BNP"  ProBNP No results found for: "PROBNP"  Imaging: No results found.  Administration History     None          Latest Ref Rng & Units 02/03/2015    8:35 AM  PFT Results  FVC-Pre L 4.25   FVC-Predicted Pre % 77   FVC-Post L 4.37   FVC-Predicted Post % 79   Pre FEV1/FVC % % 79   Post FEV1/FCV % % 79   FEV1-Pre L 3.37   FEV1-Predicted Pre % 78   FEV1-Post L 3.44   DLCO uncorrected ml/min/mmHg 34.07   DLCO UNC% % 97   DLVA Predicted % 111   TLC L 6.21   TLC % Predicted % 84   RV % Predicted % 74     No results found for: "NITRICOXIDE"      Assessment & Plan:   No problem-specific Assessment & Plan notes found for this encounter.     Rubye Oaks, NP 07/10/2023

## 2023-07-10 NOTE — Assessment & Plan Note (Signed)
Severe obstructive sleep apnea-patient education given on sleep apnea potential complications of untreated sleep apnea.  Patient begin CPAP therapy begin Auto CPAP 5 to 20 cm H2O.  Order has been sent to [facility]   Patient is aware to contact them for set up  Plan  Patient Instructions  Begin CPAP At bedtime, wear all night long.  Reach out to [facility] to pick up CPAP  Do not drive if sleepy  Work on healthy weight loss  Follow up in 2-3 months with Dr. Everardo All and As needed

## 2023-07-10 NOTE — Patient Instructions (Addendum)
Begin CPAP At bedtime, wear all night long.  Reach out to [facility] to pick up CPAP  Do not drive if sleepy  Work on healthy weight loss  Follow up in 2-3 months with Dr. Everardo All and As needed

## 2023-07-20 DIAGNOSIS — G4733 Obstructive sleep apnea (adult) (pediatric): Secondary | ICD-10-CM | POA: Diagnosis not present

## 2023-07-20 DIAGNOSIS — I10 Essential (primary) hypertension: Secondary | ICD-10-CM | POA: Diagnosis not present

## 2023-08-06 ENCOUNTER — Other Ambulatory Visit: Payer: Self-pay | Admitting: Internal Medicine

## 2023-08-06 DIAGNOSIS — R931 Abnormal findings on diagnostic imaging of heart and coronary circulation: Secondary | ICD-10-CM

## 2023-08-06 DIAGNOSIS — E785 Hyperlipidemia, unspecified: Secondary | ICD-10-CM

## 2023-08-10 ENCOUNTER — Other Ambulatory Visit: Payer: Self-pay | Admitting: Internal Medicine

## 2023-08-10 DIAGNOSIS — E039 Hypothyroidism, unspecified: Secondary | ICD-10-CM

## 2023-08-10 DIAGNOSIS — I10 Essential (primary) hypertension: Secondary | ICD-10-CM

## 2023-08-12 ENCOUNTER — Other Ambulatory Visit: Payer: Self-pay | Admitting: Internal Medicine

## 2023-08-12 DIAGNOSIS — I10 Essential (primary) hypertension: Secondary | ICD-10-CM

## 2023-08-13 ENCOUNTER — Ambulatory Visit (INDEPENDENT_AMBULATORY_CARE_PROVIDER_SITE_OTHER): Payer: 59 | Admitting: Internal Medicine

## 2023-08-13 VITALS — BP 146/92 | HR 84 | Temp 98.3°F | Resp 16 | Ht 72.0 in | Wt 249.0 lb

## 2023-08-13 DIAGNOSIS — E876 Hypokalemia: Secondary | ICD-10-CM | POA: Diagnosis not present

## 2023-08-13 DIAGNOSIS — T50905A Adverse effect of unspecified drugs, medicaments and biological substances, initial encounter: Secondary | ICD-10-CM | POA: Diagnosis not present

## 2023-08-13 DIAGNOSIS — R931 Abnormal findings on diagnostic imaging of heart and coronary circulation: Secondary | ICD-10-CM

## 2023-08-13 DIAGNOSIS — R739 Hyperglycemia, unspecified: Secondary | ICD-10-CM

## 2023-08-13 DIAGNOSIS — D860 Sarcoidosis of lung: Secondary | ICD-10-CM | POA: Diagnosis not present

## 2023-08-13 DIAGNOSIS — I10 Essential (primary) hypertension: Secondary | ICD-10-CM

## 2023-08-13 DIAGNOSIS — E785 Hyperlipidemia, unspecified: Secondary | ICD-10-CM

## 2023-08-13 LAB — CBC WITH DIFFERENTIAL/PLATELET
Basophils Absolute: 0 10*3/uL (ref 0.0–0.1)
Basophils Relative: 0.5 % (ref 0.0–3.0)
Eosinophils Absolute: 0.2 10*3/uL (ref 0.0–0.7)
Eosinophils Relative: 3 % (ref 0.0–5.0)
HCT: 45.5 % (ref 39.0–52.0)
Hemoglobin: 15.5 g/dL (ref 13.0–17.0)
Lymphocytes Relative: 28.7 % (ref 12.0–46.0)
Lymphs Abs: 2.1 10*3/uL (ref 0.7–4.0)
MCHC: 34.1 g/dL (ref 30.0–36.0)
MCV: 96.8 fL (ref 78.0–100.0)
Monocytes Absolute: 0.5 10*3/uL (ref 0.1–1.0)
Monocytes Relative: 6.5 % (ref 3.0–12.0)
Neutro Abs: 4.5 10*3/uL (ref 1.4–7.7)
Neutrophils Relative %: 61.3 % (ref 43.0–77.0)
Platelets: 276 10*3/uL (ref 150.0–400.0)
RBC: 4.7 Mil/uL (ref 4.22–5.81)
RDW: 13.1 % (ref 11.5–15.5)
WBC: 7.4 10*3/uL (ref 4.0–10.5)

## 2023-08-13 LAB — HEMOGLOBIN A1C: Hgb A1c MFr Bld: 5.5 % (ref 4.6–6.5)

## 2023-08-13 NOTE — Patient Instructions (Signed)
Hypertension, Adult High blood pressure (hypertension) is when the force of blood pumping through the arteries is too strong. The arteries are the blood vessels that carry blood from the heart throughout the body. Hypertension forces the heart to work harder to pump blood and may cause arteries to become narrow or stiff. Untreated or uncontrolled hypertension can lead to a heart attack, heart failure, a stroke, kidney disease, and other problems. A blood pressure reading consists of a higher number over a lower number. Ideally, your blood pressure should be below 120/80. The first ("top") number is called the systolic pressure. It is a measure of the pressure in your arteries as your heart beats. The second ("bottom") number is called the diastolic pressure. It is a measure of the pressure in your arteries as the heart relaxes. What are the causes? The exact cause of this condition is not known. There are some conditions that result in high blood pressure. What increases the risk? Certain factors may make you more likely to develop high blood pressure. Some of these risk factors are under your control, including: Smoking. Not getting enough exercise or physical activity. Being overweight. Having too much fat, sugar, calories, or salt (sodium) in your diet. Drinking too much alcohol. Other risk factors include: Having a personal history of heart disease, diabetes, high cholesterol, or kidney disease. Stress. Having a family history of high blood pressure and high cholesterol. Having obstructive sleep apnea. Age. The risk increases with age. What are the signs or symptoms? High blood pressure may not cause symptoms. Very high blood pressure (hypertensive crisis) may cause: Headache. Fast or irregular heartbeats (palpitations). Shortness of breath. Nosebleed. Nausea and vomiting. Vision changes. Severe chest pain, dizziness, and seizures. How is this diagnosed? This condition is diagnosed by  measuring your blood pressure while you are seated, with your arm resting on a flat surface, your legs uncrossed, and your feet flat on the floor. The cuff of the blood pressure monitor will be placed directly against the skin of your upper arm at the level of your heart. Blood pressure should be measured at least twice using the same arm. Certain conditions can cause a difference in blood pressure between your right and left arms. If you have a high blood pressure reading during one visit or you have normal blood pressure with other risk factors, you may be asked to: Return on a different day to have your blood pressure checked again. Monitor your blood pressure at home for 1 week or longer. If you are diagnosed with hypertension, you may have other blood or imaging tests to help your health care provider understand your overall risk for other conditions. How is this treated? This condition is treated by making healthy lifestyle changes, such as eating healthy foods, exercising more, and reducing your alcohol intake. You may be referred for counseling on a healthy diet and physical activity. Your health care provider may prescribe medicine if lifestyle changes are not enough to get your blood pressure under control and if: Your systolic blood pressure is above 130. Your diastolic blood pressure is above 80. Your personal target blood pressure may vary depending on your medical conditions, your age, and other factors. Follow these instructions at home: Eating and drinking  Eat a diet that is high in fiber and potassium, and low in sodium, added sugar, and fat. An example of this eating plan is called the DASH diet. DASH stands for Dietary Approaches to Stop Hypertension. To eat this way: Eat   plenty of fresh fruits and vegetables. Try to fill one half of your plate at each meal with fruits and vegetables. Eat whole grains, such as whole-wheat pasta, brown rice, or whole-grain bread. Fill about one  fourth of your plate with whole grains. Eat or drink low-fat dairy products, such as skim milk or low-fat yogurt. Avoid fatty cuts of meat, processed or cured meats, and poultry with skin. Fill about one fourth of your plate with lean proteins, such as fish, chicken without skin, beans, eggs, or tofu. Avoid pre-made and processed foods. These tend to be higher in sodium, added sugar, and fat. Reduce your daily sodium intake. Many people with hypertension should eat less than 1,500 mg of sodium a day. Do not drink alcohol if: Your health care provider tells you not to drink. You are pregnant, may be pregnant, or are planning to become pregnant. If you drink alcohol: Limit how much you have to: 0-1 drink a day for women. 0-2 drinks a day for men. Know how much alcohol is in your drink. In the [place] one drink equals one 12 oz bottle of beer (355 mL), one 5 oz glass of wine (148 mL), or one 1 oz glass of hard liquor (44 mL). Lifestyle  Work with your health care provider to maintain a healthy body weight or to lose weight. Ask what an ideal weight is for you. Get at least 30 minutes of exercise that causes your heart to beat faster (aerobic exercise) most days of the week. Activities may include walking, swimming, or biking. Include exercise to strengthen your muscles (resistance exercise), such as Pilates or lifting weights, as part of your weekly exercise routine. Try to do these types of exercises for 30 minutes at least 3 days a week. Do not use any products that contain nicotine or tobacco. These products include cigarettes, chewing tobacco, and vaping devices, such as e-cigarettes. If you need help quitting, ask your health care provider. Monitor your blood pressure at home as told by your health care provider. Keep all follow-up visits. This is important. Medicines Take over-the-counter and prescription medicines only as told by your health care provider. Follow directions carefully. Blood  pressure medicines must be taken as prescribed. Do not skip doses of blood pressure medicine. Doing this puts you at risk for problems and can make the medicine less effective. Ask your health care provider about side effects or reactions to medicines that you should watch for. Contact a health care provider if you: Think you are having a reaction to a medicine you are taking. Have headaches that keep coming back (recurring). Feel dizzy. Have swelling in your ankles. Have trouble with your vision. Get help right away if you: Develop a severe headache or confusion. Have unusual weakness or numbness. Feel faint. Have severe pain in your chest or abdomen. Vomit repeatedly. Have trouble breathing. These symptoms may be an emergency. Get help right away. Call 911. Do not wait to see if the symptoms will go away. Do not drive yourself to the hospital. Summary Hypertension is when the force of blood pumping through your arteries is too strong. If this condition is not controlled, it may put you at risk for serious complications. Your personal target blood pressure may vary depending on your medical conditions, your age, and other factors. For most people, a normal blood pressure is less than 120/80. Hypertension is treated with lifestyle changes, medicines, or a combination of both. Lifestyle changes include losing weight, eating a healthy,   low-sodium diet, exercising more, and limiting alcohol. This information is not intended to replace advice given to you by your health care provider. Make sure you discuss any questions you have with your health care provider. Document Revised: 06/28/2021 Document Reviewed: 06/28/2021 Elsevier Patient Education  2024 Elsevier Inc.  

## 2023-08-13 NOTE — Progress Notes (Unsigned)
Subjective:  Patient ID: Chase Mcclure, male    DOB: Nov 06, 1970  Age: 52 y.o. MRN: [id number]  CC: Hypertension and Hyperlipidemia   HPI Chase Mcclure presents for f/up ----  Discussed the use of AI scribe software for clinical note transcription with the patient, who gave verbal consent to proceed.  History of Present Illness   The patient, with a history of hypertension and sleep apnea, reports a significant improvement in his overall well-being since starting CPAP therapy three weeks ago. He was diagnosed with severe sleep apnea, experiencing approximately 32.7 episodes per hour. Since starting the CPAP therapy, he has noticed a marked improvement in his energy levels and mental clarity. He reports only one instance of the CPAP device being dislodged during sleep.  The patient also reports a reduction in his exertional dyspnea, although he acknowledges it's been a relatively short period since starting the CPAP therapy. He denies any chest pain or leg swelling. However, he expresses concern about his blood pressure control, citing instances of high readings at home and during his CPAP fitting. He is currently on indapamide and amlodipine for hypertension management.  The patient denies any pain or the use of any pain medications, occasionally taking Advil as needed. He reports no adverse effects from his current medications and denies any muscle or joint aches, twitches, or cramping. He also reports that his thyroid dose is well-controlled, as per his most recent lab results.       Outpatient Medications Prior to Visit  Medication Sig Dispense Refill   amLODipine (NORVASC) 5 MG tablet TAKE 1 TABLET (5 MG TOTAL) BY MOUTH DAILY. 90 tablet 0   aspirin EC 81 MG tablet Take 1 tablet (81 mg total) by mouth daily. Swallow whole. 30 tablet 12   cetirizine (ZYRTEC) 10 MG tablet Take 10 mg by mouth daily.     levothyroxine (SYNTHROID) 25 MCG tablet TAKE 1 TABLET BY MOUTH DAILY BEFORE  BREAKFAST. 30 tablet 3   Multiple Vitamin (MULTIVITAMIN) capsule Take 1 capsule by mouth daily.     indapamide (LOZOL) 1.25 MG tablet TAKE 1 TABLET BY MOUTH DAILY. 30 tablet 0   rosuvastatin (CRESTOR) 20 MG tablet Take 1 tablet (20 mg total) by mouth daily. 90 tablet 1   Vitamin D, Ergocalciferol, 2000 units CAPS Take 2 capsules by mouth daily. 30 capsule 0   No facility-administered medications prior to visit.    ROS Review of Systems  Objective:  BP (!) 146/92 (BP Location: Left Arm, Patient Position: Sitting, Cuff Size: Normal)   Pulse 84   Temp 98.3 F (36.8 C) (Oral)   Resp 16   Ht 6' (1.829 m)   Wt 249 lb (112.9 kg)   SpO2 97%   BMI 33.77 kg/m   BP Readings from Last 3 Encounters:  08/13/23 (!) 146/92  07/10/23 (!) 134/100  06/18/23 (!) 140/88    Wt Readings from Last 3 Encounters:  08/13/23 249 lb (112.9 kg)  06/18/23 245 lb 9.6 oz (111.4 kg)  04/23/23 240 lb 3.2 oz (109 kg)    Physical Exam  Lab Results  Component Value Date   WBC 8.8 11/06/2022   HGB 16.5 11/06/2022   HCT 46.4 11/06/2022   PLT 335.0 11/06/2022   GLUCOSE 123 (H) 01/08/2023   CHOL 133 03/29/2022   TRIG 117.0 03/29/2022   HDL 43.10 03/29/2022   LDLDIRECT 146.0 10/04/2021   LDLCALC 66 03/29/2022   ALT 40 11/06/2022   AST 31 11/06/2022  NA 141 01/08/2023   K 4.4 01/08/2023   CL 99 01/08/2023   CREATININE 0.81 01/08/2023   BUN 9 01/08/2023   CO2 31 01/08/2023   TSH 3.380 06/18/2023   PSA 0.94 11/06/2022   INR 1.11 12/29/2014   HGBA1C 5.7 11/06/2022    CT CORONARY MORPH W/CTA COR W/SCORE W/CA W/CM &/OR WO/CM  Addendum Date: 01/25/2023   ADDENDUM REPORT: 01/25/2023 17:14 EXAM: OVER-READ INTERPRETATION  CT CHEST The following report is an over-read performed by radiologist Dr. Maryelizabeth Rowan [facility] on 01/25/2023. This over-read does not include interpretation of cardiac or coronary anatomy or pathology. The CTA interpretation by the cardiologist is attached.  COMPARISON:  None. FINDINGS: Limited view of the lung parenchyma demonstrates no suspicious nodularity. Airways are normal. Limited view of the mediastinum demonstrates no adenopathy. Esophagus normal. Limited view of the upper abdomen unremarkable. Limited view of the skeleton and chest wall is unremarkable. IMPRESSION: No significant extracardiac findings. Electronically Signed   By: Genevive Bi M.D.   On: 01/25/2023 17:14   Result Date: 01/25/2023 CLINICAL DATA:  Chest pain EXAM: Cardiac CTA MEDICATIONS: Sub lingual nitro. 4mg  x 2 TECHNIQUE: The patient was scanned on a Siemens 192 slice scanner. Gantry rotation speed was 250 msecs. Collimation was 0.6 mm. A 100 kV prospective scan was triggered in the ascending thoracic aorta at 35-75% of the R-R interval. Average HR during the scan was 60 bpm. The 3D data set was interpreted on a dedicated work station using MPR, MIP and VRT modes. A total of 80cc of contrast was used. FINDINGS: Non-cardiac: See separate report from [facility]. No LA appendage thrombus. Pulmonary veins drain normally to the left atrium. Calcium Score: 128 Agatston units. Coronary Arteries: Right dominant with no anomalies LM: No plaque or stenosis. LAD system: Calcified plaque proximal LAD, mild (25-49%) stenosis. Calcified plaque ostial D1, mild (25-49%) stenosis. Circumflex system: Calcified plaque proximal LCx, mild (1-24%) stenosis. RCA system: No plaque or stenosis. IMPRESSION: 1. Coronary artery calcium score 128 Agatston units. This places the patient in the 85th percentile for age and gender, suggesting high risk for future cardiac events. 2.  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 [employer] account executive Electronically Signed: By: Marca Ancona M.D. On: 01/22/2023 13:31    Assessment & Plan:  Essential hypertension -     CBC with Differential/Platelet; Future -     Olmesartan Medoxomil; Take 1 tablet (20 mg total) by mouth daily.  Dispense: 90 tablet; Refill: 0 -      Indapamide; Take 1 tablet (1.25 mg total) by mouth daily.  Dispense: 90 tablet; Refill: 0  Pulmonary sarcoidosis (HCC) -     Basic metabolic panel; Future  Chronic hyperglycemia -     Basic metabolic panel; Future -     Hemoglobin A1c; Future  Hyperlipidemia LDL goal <100 -     Lipid panel; Future -     CK; Future -     Rosuvastatin Calcium; Take 1 tablet (20 mg total) by mouth daily.  Dispense: 90 tablet; Refill: 1  Agatston coronary artery calcium score between 100 and 199 -     Rosuvastatin Calcium; Take 1 tablet (20 mg total) by mouth daily.  Dispense: 90 tablet; Refill: 1     Follow-up: Return in about 4 months (around 12/12/2023).  Sanda Linger, MD

## 2023-08-14 LAB — BASIC METABOLIC PANEL
BUN: 9 mg/dL (ref 6–23)
CO2: 31 meq/L (ref 19–32)
Calcium: 9.4 mg/dL (ref 8.4–10.5)
Chloride: 99 meq/L (ref 96–112)
Creatinine, Ser: 0.84 mg/dL (ref 0.40–1.50)
GFR: 98.62 mL/min (ref >60.00)
Glucose, Bld: 102 mg/dL — ABNORMAL HIGH (ref 70–99)
Potassium: 3.1 meq/L — ABNORMAL LOW (ref 3.5–5.1)
Sodium: 141 meq/L (ref 135–145)

## 2023-08-14 LAB — LIPID PANEL
Cholesterol: 146 mg/dL (ref 0–200)
HDL: 42.8 mg/dL (ref >39.00)
LDL Cholesterol: 44 mg/dL (ref 0–99)
NonHDL: 102.75
Total CHOL/HDL Ratio: 3
Triglycerides: 296 mg/dL — ABNORMAL HIGH (ref 0.0–149.0)
VLDL: 59.2 mg/dL — ABNORMAL HIGH (ref 0.0–40.0)

## 2023-08-14 LAB — CK: Total CK: 120 U/L (ref 7–232)

## 2023-08-14 MED ORDER — ROSUVASTATIN CALCIUM 20 MG PO TABS: 20.0000 mg | ORAL_TABLET | Freq: Every day | ORAL | 1 refills | Status: DC

## 2023-08-14 MED ORDER — INDAPAMIDE 1.25 MG PO TABS: 1.2500 mg | ORAL_TABLET | Freq: Every day | ORAL | 0 refills | Status: DC

## 2023-08-14 MED ORDER — OLMESARTAN MEDOXOMIL 20 MG PO TABS
20.0000 mg | ORAL_TABLET | Freq: Every day | ORAL | 0 refills | Status: DC
Start: 2023-08-14 — End: 2023-10-03

## 2023-08-15 DIAGNOSIS — T50905A Adverse effect of unspecified drugs, medicaments and biological substances, initial encounter: Secondary | ICD-10-CM | POA: Insufficient documentation

## 2023-08-15 MED ORDER — POTASSIUM CHLORIDE ER 10 MEQ PO TBCR
10.0000 meq | EXTENDED_RELEASE_TABLET | Freq: Two times a day (BID) | ORAL | 0 refills | Status: DC
Start: 2023-08-15 — End: 2023-12-10

## 2023-08-15 MED ORDER — SPIRONOLACTONE 25 MG PO TABS
25.0000 mg | ORAL_TABLET | Freq: Every day | ORAL | 0 refills | Status: DC
Start: 2023-08-15 — End: 2023-11-05

## 2023-08-16 ENCOUNTER — Telehealth: Payer: Self-pay

## 2023-08-16 NOTE — Progress Notes (Signed)
   Care Guide Note  08/16/2023 Name: Chase Mcclure MRN: [id number] DOB: 1970/10/22  Referred by: Etta Grandchild, MD Reason for referral : Care Coordination (Outreach to schedule with Pharm d )   Chase Mcclure is a 52 y.o. year old male who is a primary care patient of Etta Grandchild, MD. [name] was referred to the pharmacist for assistance related to HTN.    An unsuccessful telephone outreach was attempted today to contact the patient who was referred to the pharmacy team for assistance with medication management. Additional attempts will be made to contact the patient.   Penne Lash , RMA     [facility]  [facility], [facility] Guide  Direct Dial: [phone]  Website: [email or web address]

## 2023-08-19 DIAGNOSIS — G4733 Obstructive sleep apnea (adult) (pediatric): Secondary | ICD-10-CM | POA: Diagnosis not present

## 2023-08-19 DIAGNOSIS — I10 Essential (primary) hypertension: Secondary | ICD-10-CM | POA: Diagnosis not present

## 2023-08-23 NOTE — Progress Notes (Signed)
Care Guide Pharmacy Note  08/23/2023 Name: [name] MRN: [id number] DOB: 01-15-1971  Referred By: Etta Grandchild, MD Reason for referral: Care Coordination (Outreach to schedule with Pharm d )   Chase BOSSERT is a 52 y.o. year old male who is a primary care patient of Etta Grandchild, MD.  [name] was referred to the pharmacist for assistance related to: HTN  A second unsuccessful telephone outreach was attempted today to contact the patient who was referred to the pharmacy team for assistance with medication management. Additional attempts will be made to contact the patient.  Penne Lash , RMA     [facility]  [facility], [facility] Guide  Direct Dial: [phone]  Website: [email or web address]

## 2023-08-23 NOTE — Progress Notes (Signed)
Care Guide Pharmacy Note  08/23/2023 Name: REUEL PITTER MRN: [id number] DOB: 01-19-1971  Referred By: Etta Grandchild, MD Reason for referral: Care Coordination (Outreach to schedule with Pharm d )   FHER WYNDHAM is a 52 y.o. year old male who is a primary care patient of Etta Grandchild, MD.  [name] was referred to the pharmacist for assistance related to: HTN  Successful contact was made with the patient to discuss pharmacy services including being ready for the pharmacist to call at least 5 minutes before the scheduled appointment time and to have medication bottles and any blood pressure readings ready for review. The patient agreed to meet with the pharmacist via telephone visit on (date/time).09/14/2023  Penne Lash , RMA     [place]  [facility], [facility] Guide  Direct Dial: [phone]  Website: [email or web address]

## 2023-09-07 ENCOUNTER — Other Ambulatory Visit: Payer: Self-pay | Admitting: Internal Medicine

## 2023-09-07 DIAGNOSIS — I10 Essential (primary) hypertension: Secondary | ICD-10-CM

## 2023-09-14 ENCOUNTER — Other Ambulatory Visit: Payer: 59 | Admitting: Pharmacist

## 2023-09-14 DIAGNOSIS — I10 Essential (primary) hypertension: Secondary | ICD-10-CM

## 2023-09-14 NOTE — Progress Notes (Signed)
 09/17/2023 Name: Chase Mcclure MRN: [id number] DOB: Jan 25, 1971  Chief Complaint  Patient presents with   Hypertension   Medication Management     Chase Mcclure is a 53 y.o. year old male who presented for a telephone visit.   They were referred to the pharmacist by their PCP for assistance in managing hypertension.   Subjective:  Care Team: Primary Care Provider: Joshua Debby LITTIE, MD ; Next Scheduled Visit: not scheduled   Medication Access/Adherence  Current Pharmacy:  [facility] [phone] - [place], [place] - [address] [place] [address] Phone: [phone] Fax: [phone]   Patient reports affordability concerns with their medications: No  Patient reports access/transportation concerns to their pharmacy: No  Patient reports adherence concerns with their medications:  No     Hypertension:  Current medications: amlodipine  5 mg daily, olmesartan  20 mg daily, spironolactone  25 mg daily, potassium chloride  10 mg BID - olmesartan /spironolactone /potassium started 12/9 -takes all BP meds in the AM Medications previously tried: indapamide  stopped due to hypokalemia  Patient has a validated, automated, upper arm home BP cuff Current blood pressure readings: reports BP 120s/90-95 at home  Patient denies hypotensive s/sx including dizziness, lightheadedness.  Patient denies hypertensive symptoms including headache, chest pain, shortness of breath   *Pt mentions interest in possibly trying a GLP-1 because he has heard it can help with sleep apnea. He was recently diagnosed and began wearing a CPAP at the end of last year. He feels his sleep is much improved and notes this may also help with his BP control. He is seeing pulm soon and will discuss this with them.  Objective: BP Readings from Last 3 Encounters:  08/13/23 (!) 146/92  07/10/23 (!) 134/100  06/18/23 (!) 140/88     Lab Results  Component  Value Date   HGBA1C 5.5 08/13/2023    Lab Results  Component Value Date   CREATININE 0.84 08/13/2023   BUN 9 08/13/2023   NA 141 08/13/2023   K 3.1 (L) 08/13/2023   CL 99 08/13/2023   CO2 31 08/13/2023    Lab Results  Component Value Date   CHOL 146 08/13/2023   HDL 42.80 08/13/2023   LDLCALC 44 08/13/2023   LDLDIRECT 146.0 10/04/2021   TRIG 296.0 (H) 08/13/2023   CHOLHDL 3 08/13/2023    Medications Reviewed Today     Reviewed by Merceda Lela SAUNDERS, RPH (Pharmacist) on 09/17/23 at 0826  Med List Status: <None>   Medication Order Taking? Sig Documenting Provider Last Dose Status Informant  amLODipine  (NORVASC ) 5 MG tablet [id number] Yes TAKE 1 TABLET (5 MG TOTAL) BY MOUTH DAILY. Joshua Debby LITTIE, MD Taking Active   aspirin  EC 81 MG tablet [id number] Yes Take 1 tablet (81 mg total) by mouth daily. Swallow whole. Joshua Debby LITTIE, MD Taking Active   cetirizine (ZYRTEC) 10 MG tablet [id number]  Take 10 mg by mouth daily. [provider]  Active   levothyroxine  (SYNTHROID ) 25 MCG tablet [id number] Yes TAKE 1 TABLET BY MOUTH DAILY BEFORE BREAKFAST. Joshua Debby LITTIE, MD Taking Active   Multiple Vitamin (MULTIVITAMIN) capsule [id number]  Take 1 capsule by mouth daily. [provider]  Active   olmesartan  (BENICAR ) 20 MG tablet [id number] Yes Take 1 tablet (20 mg total) by mouth daily. Joshua Debby LITTIE, MD Taking Active   potassium chloride  (KLOR-CON  10) 10 MEQ tablet [id number] Yes Take 1 tablet (10 mEq total) by mouth  2 (two) times daily. Joshua Debby CROME, MD Taking Active   rosuvastatin  (CRESTOR ) 20 MG tablet [id number] Yes Take 1 tablet (20 mg total) by mouth daily. Joshua Debby CROME, MD Taking Active   spironolactone  (ALDACTONE ) 25 MG tablet [id number] Yes Take 1 tablet (25 mg total) by mouth daily. Joshua Debby CROME, MD Taking Active               Assessment/Plan:   Hypertension: - Currently uncontrolled -diastolic remains elevated, systolic at goal per home  readings - Reviewed long term cardiovascular and renal outcomes of uncontrolled blood pressure - Reviewed appropriate blood pressure monitoring technique and reviewed goal blood pressure. Recommended to check home blood pressure and heart rate daily - Recommend to continue current regimen. Come next week for lab to check renal function and potassium s/p med changes.  - BP may improve with CPAP use. Discussed GLP-1 agonist for weight loss, he may be a candidate given BMI and comorbidities, however is dependent on his insurance coverage.    Follow Up Plan: 1/23  Darrelyn Drum, PharmD, BCPS, CPP Clinical Pharmacist Practitioner [facility] [phone]

## 2023-09-17 NOTE — Patient Instructions (Signed)
 It was a pleasure speaking with you today!  Continue monitoring blood pressure at home.  Come the week of 1/13 to get blood work done.  Feel free to call with any questions or concerns!  Darrelyn Drum, PharmD, BCPS [facility] Clinical Pharmacist [facility] [phone]

## 2023-09-19 ENCOUNTER — Ambulatory Visit (HOSPITAL_BASED_OUTPATIENT_CLINIC_OR_DEPARTMENT_OTHER): Payer: 59 | Admitting: Pulmonary Disease

## 2023-09-19 DIAGNOSIS — I10 Essential (primary) hypertension: Secondary | ICD-10-CM | POA: Diagnosis not present

## 2023-09-19 DIAGNOSIS — G4733 Obstructive sleep apnea (adult) (pediatric): Secondary | ICD-10-CM | POA: Diagnosis not present

## 2023-09-20 ENCOUNTER — Other Ambulatory Visit (INDEPENDENT_AMBULATORY_CARE_PROVIDER_SITE_OTHER): Payer: 59

## 2023-09-20 DIAGNOSIS — I10 Essential (primary) hypertension: Secondary | ICD-10-CM

## 2023-09-20 LAB — BASIC METABOLIC PANEL
BUN: 11 mg/dL (ref 6–23)
CO2: 28 meq/L (ref 19–32)
Calcium: 10.2 mg/dL (ref 8.4–10.5)
Chloride: 102 meq/L (ref 96–112)
Creatinine, Ser: 0.89 mg/dL (ref 0.40–1.50)
GFR: 96.84 mL/min (ref >60.00)
Glucose, Bld: 107 mg/dL — ABNORMAL HIGH (ref 70–99)
Potassium: 4.6 meq/L (ref 3.5–5.1)
Sodium: 137 meq/L (ref 135–145)

## 2023-09-24 ENCOUNTER — Encounter (HOSPITAL_BASED_OUTPATIENT_CLINIC_OR_DEPARTMENT_OTHER): Payer: Self-pay | Admitting: Pulmonary Disease

## 2023-09-24 ENCOUNTER — Ambulatory Visit (INDEPENDENT_AMBULATORY_CARE_PROVIDER_SITE_OTHER): Payer: 59 | Admitting: Pulmonary Disease

## 2023-09-24 VITALS — BP 136/78 | HR 88 | Ht 72.0 in | Wt 247.0 lb

## 2023-09-24 DIAGNOSIS — D869 Sarcoidosis, unspecified: Secondary | ICD-10-CM

## 2023-09-24 DIAGNOSIS — G4733 Obstructive sleep apnea (adult) (pediatric): Secondary | ICD-10-CM

## 2023-09-24 NOTE — Progress Notes (Unsigned)
Subjective:   PATIENT ID: Chase Mcclure GENDER: male DOB: [birth year], MRN: [id number]  Chief Complaint  Patient presents with   Sleep Apnea    Reason for Visit: Pulmonary sarcoid, OSA  Mr. Chase Mcclure is a 53 year old male former smoker with sarcoid, severe OSA, hypothyroidism, HTN, HLD who presents for follow-up.  Initial consult 53 year old male with past medical history of non-obstructive CAD followed by Dr. Flora Mcclure, and sarcoidosis diagnosed in 2016 per tracheal lymph node biopsy who presents for consult regarding his sarcoidosis. In 2016, he endorsed having worsening shortness of breath with activity, as well as diffuse joint pains. He subsequently went to his PCP, who sent him to rheumatology for autoimmune workup. AI workup was negative, however chest x-ray at the time did show bilateral hilar adenopathy. Biopsy confirmed sarcoidosis.   Currently, he endorses extreme fatigue as well as snoring at night. His epworth scale is 15, and stop-bang is 4. Endorses some shortness of breath, but able to complete most ADLs and IADLs. No wheezing or coughing.   He did see Dr. Shelle Mcclure of Pulmonology in 2016, and at that time was on steroid treatment initiated by rheumatology during autoimmune workup. At that time, pt was asymptomatic, and did not require advanced medications.   09/24/23 Since our last visit he completed sleep study which demonstrated severe OSA. Started on CPAP and has been compliant. He reports good and bad nights but feels more rested while on it. Is less lethargic and more energized. He is working out and diet. Interested in [place].  Social History: 8 pack year smoking history. Quit >35 years ago   Past Medical History:  Diagnosis Date   Anxiety    Celiac disease    Coronary artery disease    Diverticulitis    per PET scan asymptomatic   Hypertension    diet and exercise controlled.  Also has white coat syndrome.   Shortness of breath dyspnea    recently      Family History  Problem Relation Age of Onset   Aneurysm Mother        brain   Drug abuse Mother    Arrhythmia Father    Heart attack Father    Rheum arthritis Father    Prostate cancer Father 79   Stroke Father 55       preceded bymultiple TIAs   Hypertension Father    Hearing loss Father    Dementia Father    Colon polyps Father        late 15s.   Hearing loss Brother    Hypertension Brother    Cancer Maternal Aunt        x2   Diabetes Daughter      Social History   Occupational History   Occupation: Government social research officer  Tobacco Use   Smoking status: Former    Current packs/day: 0.00    Average packs/day: 0.3 packs/day for 1 year (0.3 ttl pk-yrs)    Types: Cigarettes    Start date: 09/05/1987    Quit date: 09/04/1988    Years since quitting: 35.0   Smokeless tobacco: Former    Types: Chew    Quit date: 09/04/1998  Vaping Use   Vaping status: Former  Substance and Sexual Activity   Alcohol use: Yes    Alcohol/week: 2.0 standard drinks of alcohol    Types: 2 Glasses of wine per week    Comment: once a month   Drug use: No  Sexual activity: Yes    Partners: Female    Allergies  Allergen Reactions   Gluten Meal Other (See Comments)    Celiac disease   Prednisone Anxiety    Irritable     Outpatient Medications Prior to Visit  Medication Sig Dispense Refill   amLODipine (NORVASC) 5 MG tablet TAKE 1 TABLET (5 MG TOTAL) BY MOUTH DAILY. 90 tablet 1   aspirin EC 81 MG tablet Take 1 tablet (81 mg total) by mouth daily. Swallow whole. 30 tablet 12   cetirizine (ZYRTEC) 10 MG tablet Take 10 mg by mouth daily.     levothyroxine (SYNTHROID) 25 MCG tablet TAKE 1 TABLET BY MOUTH DAILY BEFORE BREAKFAST. 30 tablet 3   Multiple Vitamin (MULTIVITAMIN) capsule Take 1 capsule by mouth daily.     olmesartan (BENICAR) 20 MG tablet Take 1 tablet (20 mg total) by mouth daily. 90 tablet 0   potassium chloride (KLOR-CON 10) 10 MEQ tablet Take 1 tablet (10 mEq total) by mouth 2  (two) times daily. 270 tablet 0   rosuvastatin (CRESTOR) 20 MG tablet Take 1 tablet (20 mg total) by mouth daily. 90 tablet 1   spironolactone (ALDACTONE) 25 MG tablet Take 1 tablet (25 mg total) by mouth daily. 90 tablet 0   No facility-administered medications prior to visit.    Review of Systems  Constitutional:  Positive for malaise/fatigue. Negative for chills, diaphoresis, fever and weight loss.  HENT:  Negative for congestion.   Respiratory:  Negative for cough, hemoptysis, sputum production, shortness of breath and wheezing.   Cardiovascular:  Negative for chest pain, palpitations and leg swelling.     Objective:   Vitals:   09/24/23 1416  BP: 136/78  Pulse: 88  SpO2: 98%  Weight: 247 lb (112 kg)  Height: 6' (1.829 m)   SpO2: 98 %  Physical Exam: General: Well-appearing, no acute distress HENT: [place], AT Eyes: EOMI, no scleral icterus Respiratory: Clear to auscultation bilaterally.  No crackles, wheezing or rales Cardiovascular: RRR, -M/R/G, no JVD Extremities:-Edema,-tenderness Neuro: AAO x4, CNII-XII grossly intact Psych: Normal mood, normal affect   Data Reviewed:  Imaging:  CT CORONARY MORPH W/CTA COR W/SCORE W/CA W/CM &/OR WO/CM  Addendum Date: 01/25/2023   ADDENDUM REPORT: 01/25/2023 17:14 EXAM: OVER-READ INTERPRETATION  CT CHEST The following report is an over-read performed by radiologist Dr. Maryelizabeth Rowan [facility] on 01/25/2023. This over-read does not include interpretation of cardiac or coronary anatomy or pathology. The CTA interpretation by the cardiologist is attached. COMPARISON:  None. FINDINGS: Limited view of the lung parenchyma demonstrates no suspicious nodularity. Airways are normal. Limited view of the mediastinum demonstrates no adenopathy. Esophagus normal. Limited view of the upper abdomen unremarkable. Limited view of the skeleton and chest wall is unremarkable. IMPRESSION: No significant extracardiac findings.  Electronically Signed   By: Genevive Bi M.D.   On: 01/25/2023 17:14   Result Date: 01/25/2023 CLINICAL DATA:  Chest pain EXAM: Cardiac CTA MEDICATIONS: Sub lingual nitro. 4mg  x 2 TECHNIQUE: The patient was scanned on a Siemens 192 slice scanner. Gantry rotation speed was 250 msecs. Collimation was 0.6 mm. A 100 kV prospective scan was triggered in the ascending thoracic aorta at 35-75% of the R-R interval. Average HR during the scan was 60 bpm. The 3D data set was interpreted on a dedicated work station using MPR, MIP and VRT modes. A total of 80cc of contrast was used. FINDINGS: Non-cardiac: See separate report from [facility]. No LA appendage thrombus. Pulmonary  veins drain normally to the left atrium. Calcium Score: 128 Agatston units. Coronary Arteries: Right dominant with no anomalies LM: No plaque or stenosis. LAD system: Calcified plaque proximal LAD, mild (25-49%) stenosis. Calcified plaque ostial D1, mild (25-49%) stenosis. Circumflex system: Calcified plaque proximal LCx, mild (1-24%) stenosis. RCA system: No plaque or stenosis. IMPRESSION: 1. Coronary artery calcium score 128 Agatston units. This places the patient in the 85th percentile for age and gender, suggesting high risk for future cardiac events. 2.  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 [employer] Sales promotion account executive Electronically Signed: By: Marca Ancona M.D. On: 01/22/2023 13:31     PFT:  02/03/15 FVC 4.25 (77%) FEV1 3.37 (78%) Ratio 101  TLC 84% DLCO 97% Interpretation: Normal PFTs  Labs:     Latest Ref Rng & Units 08/13/2023    3:05 PM 11/06/2022    9:12 AM 10/04/2021    8:54 AM  CBC  WBC 4.0 - 10.5 K/uL 7.4  8.8  8.2   Hemoglobin 13.0 - 17.0 g/dL 16.1  09.6  04.5   Hematocrit 39.0 - 52.0 % 45.5  46.4  45.9   Platelets 150.0 - 400.0 K/uL 276.0  335.0  278.0       Latest Ref Rng & Units 09/20/2023   12:13 PM 08/13/2023    3:05 PM 01/08/2023    8:51 AM  BMP  Glucose 70 - 99 mg/dL 409  811  914   BUN 6 - 23 mg/dL 11  9  9     Creatinine 0.40 - 1.50 mg/dL 7.82  9.56  2.13   Sodium 135 - 145 mEq/L 137  141  141   Potassium 3.5 - 5.1 mEq/L 4.6  3.1  4.4   Chloride 96 - 112 mEq/L 102  99  99   CO2 19 - 32 mEq/L 28  31  31    Calcium 8.4 - 10.5 mg/dL 08.6  9.4  57.8       Latest Ref Rng & Units 11/06/2022    9:12 AM 10/04/2021    8:54 AM 09/10/2021    3:11 PM  Hepatic Function  Total Protein 6.0 - 8.3 g/dL 7.4  7.2  8.1   Albumin 3.5 - 5.2 g/dL 4.5  4.4  4.7   AST 0 - 37 U/L 31  23  33   ALT 0 - 53 U/L 40  34  41   Alk Phosphatase 39 - 117 U/L 75  75  60   Total Bilirubin 0.2 - 1.2 mg/dL 1.0  0.9  1.0   Bilirubin, Direct 0.0 - 0.3 mg/dL 0.2  0.1     Labs reviewed since last visit and normal blood counts and electrolytes. Also normal thyroid studies  CPAP 08/25/23-09/23/23 Usage days 30/30 days 100% >4 hours 30 days 100% Avg usage hours 7 hours 38 min Auto CPAP 5-20 cm H20 AHI 3.8     Assessment & Plan:   Discussion: Mr. Wynder is a 53 year old male with past medical history of non-obstructive CAD followed by Dr. Flora Mcclure, and sarcoidosis diagnosed in 2016 per tracheal lymph node biopsy who presents for follow-up for OSA and sarcoidosis. Compliant with CPAP and improved fatigue. Sarcoid stable.   Pulmonary sarcoid We discussed the clinical course of sarcoid and management including serial PFTs, labs, eye exam, and EKG and chest imaging if indicated. If symptoms suggest sarcoid flare in the future, we would manage with steroids +/- biologics. --No immunosuppression indicated. No evidence of active sarcoid on available  imaging and symptoms mild.    Pulmonary sarcoidosis --Dx in 2016 via mediastonoscopy --PET/CT4/2016 --ORDER pulmonary function tests   Sarcoid Monitoring --Recent chest imaging reviewed.  --Annual PFTs.  Last PFTs normal 2016 --Annual ophthalmology exam.  Last visit on 2024 --Reviewed labs as above --Routine labs as needed: CBC with diff, CMET, 1, 25 and 25 hydroxy vitamin D, urinary  calcium   Severe OSA The natural history, progression and prognosis of sleep apnea, treatment with PAP and alternative treatment strategies were discussed. The patient was also educated regarding the long term cardiovascular benefits of treating sleep apnea, including improved blood pressure control, reduction in MI and stroke risk as well as other potential benefits of treatment, such as improved glycemic control, facilitation of weight loss, improved energy during the day and improved sleep quality. --Patient uses NIV for more than four hours nightly for at least 70% of nights during the last three months of usage. The patient has been using and benefiting from PAP use and will continue to benefit from therapy.  --Counseled on sleep hygiene --Counseled on weight loss/maintenance of healthy weight. Interested in [place]. Agree this would be a good option for patient if eligible. Will defer to PCP --Counseled NOT to drive if/when sleepy --Advised patient to wear CPAP for at least 4 hours each night for greater than 70% of the time to avoid the machine being repossessed by insurance.    Health Maintenance Immunization History  Administered Date(s) Administered   Influenza,inj,Quad PF,6+ Mos 05/25/2017, 08/12/2018, 05/28/2019   Influenza-Unspecified 06/04/2014   Janssen (J&J) SARS-COV-2 Vaccination 12/10/2019   Tdap 10/23/2017   Zoster Recombinant(Shingrix) 10/04/2021, 12/21/2021   CT Lung Screen  12/09/2014: Adenopathy within mediastinum, hila, and in periportal region  Orders Placed This Encounter  Procedures   Pulmonary function test    Standing Status:   Future    Expiration Date:   09/23/2024    Where should this test be performed?:   [facility]    Full PFT: includes the following: basic spirometry, spirometry pre & post bronchodilator, diffusion capacity (DLCO), lung volumes:   Full PFT  No orders of the defined types were placed in this encounter.   Return in about 9  months (around 06/23/2024) for after PFT.  I have spent a total time of 30-minutes on the day of the appointment reviewing prior documentation, coordinating care and discussing medical diagnosis and plan with the patient/family. Imaging, labs and tests included in this note have been reviewed and interpreted independently by me.  Orian Amberg Mechele Collin, MD [facility] 09/24/2023 2:52 PM

## 2023-09-24 NOTE — Patient Instructions (Addendum)
OSA --Counseled on sleep hygiene --Counseled on weight loss/maintenance of healthy weight. Interested in [place]. Agree this would be a good option for patient if eligible. Will defer to PCP --Counseled NOT to drive if/when sleepy --Advised patient to wear CPAP for at least 4 hours each night for greater than 70% of the time to avoid the machine being repossessed by insurance.  Pulmonary sarcoidosis --ORDER pulmonary function tests

## 2023-09-27 ENCOUNTER — Other Ambulatory Visit: Payer: 59 | Admitting: Pharmacist

## 2023-09-27 NOTE — Progress Notes (Signed)
   10/05/2023 Name: OSTEN JANEK MRN: [id number] DOB: 08/18/1971  Chief Complaint  Patient presents with   Hypertension   Medication Management    BEVIN MAYALL is a 53 y.o. year old male who presented for a telephone visit.   They were referred to the pharmacist by their PCP for assistance in managing hypertension.   Subjective:  Care Team: Primary Care Provider: Etta Grandchild, MD ; Next Scheduled Visit: not scheduled   Medication Access/Adherence  [phone] [place] [place] - [address] [place] [address] Phone: [phone] Fax: [phone]   Patient reports affordability concerns with their medications: No  Patient reports access/transportation concerns to their pharmacy: No  Patient reports adherence concerns with their medications:  No     Hypertension:  Current medications: amlodipine 5 mg daily, olmesartan 20 mg daily, spironolactone 25 mg daily, potassium chloride 10 mg BID - olmesartan/spironolactone/potassium started 12/9 -takes all BP meds in the AM Medications previously tried: indapamide stopped due to hypokalemia  Patient has a validated, automated, upper arm home BP cuff Current blood pressure readings: reports BP 120s/90-95 at home  Patient denies hypotensive s/sx including dizziness, lightheadedness.  Patient denies hypertensive symptoms including headache, chest pain, shortness of breath   *Pt mentions interest in possibly trying a GLP-1 because he has heard it can help with sleep apnea. He was recently diagnosed and began wearing a CPAP at the end of last year. He feels his sleep is much improved and notes this may also help with his BP control. He is seeing pulm soon and will discuss this with them.  Objective: BP Readings from Last 3 Encounters:  09/24/23 136/78  08/13/23 (!) 146/92  07/10/23 (!) 134/100     Lab Results  Component Value  Date   HGBA1C 5.5 08/13/2023    Lab Results  Component Value Date   CREATININE 0.89 09/20/2023   BUN 11 09/20/2023   NA 137 09/20/2023   K 4.6 09/20/2023   CL 102 09/20/2023   CO2 28 09/20/2023    Lab Results  Component Value Date   CHOL 146 08/13/2023   HDL 42.80 08/13/2023   LDLCALC 44 08/13/2023   LDLDIRECT 146.0 10/04/2021   TRIG 296.0 (H) 08/13/2023   CHOLHDL 3 08/13/2023    Medications Reviewed Today   Medications were not reviewed in this encounter       Assessment/Plan:   Hypertension: - Currently borderline uncontrolled -diastolic remains elevated, systolic at goal per home readings - Reviewed long term cardiovascular and renal outcomes of uncontrolled blood pressure - Reviewed appropriate blood pressure monitoring technique and reviewed goal blood pressure. Recommended to check home blood pressure and heart rate daily - Renal function and potassium WNL and stable. Recommend to continue current regimen.  - BP may improve with CPAP use. Discussed GLP-1 agonist for weight loss, he may be a candidate given BMI and comorbidities, however is dependent on his insurance coverage. - Prior auths for [employer] submitted. Both were denied due to pt's insurance not covering the medications.    Follow Up Plan: PRN  Arbutus Leas, PharmD, BCPS, CPP Clinical Pharmacist Practitioner [facility] [phone]

## 2023-09-28 ENCOUNTER — Telehealth: Payer: Self-pay

## 2023-09-28 NOTE — Telephone Encounter (Signed)
Patient needs a PA for his Zepbound please advise.

## 2023-10-01 ENCOUNTER — Other Ambulatory Visit (HOSPITAL_COMMUNITY): Payer: Self-pay

## 2023-10-01 ENCOUNTER — Telehealth: Payer: Self-pay

## 2023-10-01 NOTE — Telephone Encounter (Signed)
Pharmacy Patient Advocate Encounter   Received notification from Pt Calls Messages that prior authorization for Zepbound 2.5mg /0.39ml is required/requested.   Insurance verification completed.   The patient is insured through [facility] .   Per test claim: PA required; PA started via CoverMyMeds. KEY [id number] . Waiting for clinical questions to populate.

## 2023-10-01 NOTE — Telephone Encounter (Signed)
Pharmacy Patient Advocate Encounter  Received notification from [facility] that Prior Authorization for Zepbound has been DENIED.  Full denial letter will be uploaded to the media tab. See denial reason below.    PA #/Case ID/Reference #: [id number]

## 2023-10-01 NOTE — Telephone Encounter (Signed)
Clinical questions answered and PA submitted

## 2023-10-01 NOTE — Telephone Encounter (Signed)
Patient has been made aware and gave a verbal understanding.

## 2023-10-03 ENCOUNTER — Other Ambulatory Visit: Payer: Self-pay | Admitting: Internal Medicine

## 2023-10-03 ENCOUNTER — Other Ambulatory Visit (HOSPITAL_COMMUNITY): Payer: Self-pay

## 2023-10-03 DIAGNOSIS — I10 Essential (primary) hypertension: Secondary | ICD-10-CM

## 2023-10-04 ENCOUNTER — Other Ambulatory Visit (HOSPITAL_COMMUNITY): Payer: Self-pay

## 2023-10-05 ENCOUNTER — Other Ambulatory Visit: Payer: Self-pay | Admitting: Internal Medicine

## 2023-10-05 DIAGNOSIS — E039 Hypothyroidism, unspecified: Secondary | ICD-10-CM

## 2023-10-09 ENCOUNTER — Encounter: Payer: Self-pay | Admitting: Internal Medicine

## 2023-10-09 DIAGNOSIS — G4733 Obstructive sleep apnea (adult) (pediatric): Secondary | ICD-10-CM

## 2023-10-09 DIAGNOSIS — E669 Obesity, unspecified: Secondary | ICD-10-CM

## 2023-10-09 MED ORDER — TIRZEPATIDE-WEIGHT MANAGEMENT 2.5 MG/0.5ML ~~LOC~~ SOLN: 2.5000 mg | SUBCUTANEOUS | 0 refills | Status: DC

## 2023-10-10 NOTE — Telephone Encounter (Signed)
 Denial letter indexed to media tab

## 2023-10-11 ENCOUNTER — Other Ambulatory Visit (HOSPITAL_COMMUNITY): Payer: Self-pay

## 2023-10-20 DIAGNOSIS — G4733 Obstructive sleep apnea (adult) (pediatric): Secondary | ICD-10-CM | POA: Diagnosis not present

## 2023-10-20 DIAGNOSIS — I10 Essential (primary) hypertension: Secondary | ICD-10-CM | POA: Diagnosis not present

## 2023-10-26 ENCOUNTER — Other Ambulatory Visit: Payer: Self-pay | Admitting: Internal Medicine

## 2023-10-26 DIAGNOSIS — G4733 Obstructive sleep apnea (adult) (pediatric): Secondary | ICD-10-CM

## 2023-10-26 DIAGNOSIS — E669 Obesity, unspecified: Secondary | ICD-10-CM

## 2023-10-29 ENCOUNTER — Other Ambulatory Visit: Payer: Self-pay | Admitting: Internal Medicine

## 2023-11-04 ENCOUNTER — Other Ambulatory Visit: Payer: Self-pay | Admitting: Internal Medicine

## 2023-11-04 DIAGNOSIS — I10 Essential (primary) hypertension: Secondary | ICD-10-CM

## 2023-11-04 DIAGNOSIS — T50905A Adverse effect of unspecified drugs, medicaments and biological substances, initial encounter: Secondary | ICD-10-CM

## 2023-11-17 DIAGNOSIS — G4733 Obstructive sleep apnea (adult) (pediatric): Secondary | ICD-10-CM | POA: Diagnosis not present

## 2023-11-17 DIAGNOSIS — I10 Essential (primary) hypertension: Secondary | ICD-10-CM | POA: Diagnosis not present

## 2023-11-22 ENCOUNTER — Other Ambulatory Visit: Payer: Self-pay | Admitting: Internal Medicine

## 2023-11-22 DIAGNOSIS — E669 Obesity, unspecified: Secondary | ICD-10-CM

## 2023-11-22 DIAGNOSIS — G4733 Obstructive sleep apnea (adult) (pediatric): Secondary | ICD-10-CM

## 2023-11-22 MED ORDER — TIRZEPATIDE-WEIGHT MANAGEMENT 5 MG/0.5ML ~~LOC~~ SOLN: 5.0000 mg | SUBCUTANEOUS | 0 refills | Status: DC

## 2023-12-10 ENCOUNTER — Other Ambulatory Visit: Payer: Self-pay | Admitting: Internal Medicine

## 2023-12-10 DIAGNOSIS — T50905A Adverse effect of unspecified drugs, medicaments and biological substances, initial encounter: Secondary | ICD-10-CM

## 2023-12-10 DIAGNOSIS — I10 Essential (primary) hypertension: Secondary | ICD-10-CM

## 2023-12-18 DIAGNOSIS — G4733 Obstructive sleep apnea (adult) (pediatric): Secondary | ICD-10-CM | POA: Diagnosis not present

## 2023-12-18 DIAGNOSIS — I10 Essential (primary) hypertension: Secondary | ICD-10-CM | POA: Diagnosis not present

## 2024-01-10 ENCOUNTER — Other Ambulatory Visit: Payer: Self-pay | Admitting: Internal Medicine

## 2024-01-10 DIAGNOSIS — I10 Essential (primary) hypertension: Secondary | ICD-10-CM

## 2024-01-10 DIAGNOSIS — E876 Hypokalemia: Secondary | ICD-10-CM

## 2024-01-17 DIAGNOSIS — G4733 Obstructive sleep apnea (adult) (pediatric): Secondary | ICD-10-CM | POA: Diagnosis not present

## 2024-01-17 DIAGNOSIS — I10 Essential (primary) hypertension: Secondary | ICD-10-CM | POA: Diagnosis not present

## 2024-01-29 ENCOUNTER — Other Ambulatory Visit: Payer: Self-pay | Admitting: Internal Medicine

## 2024-01-29 DIAGNOSIS — I10 Essential (primary) hypertension: Secondary | ICD-10-CM

## 2024-01-29 DIAGNOSIS — E039 Hypothyroidism, unspecified: Secondary | ICD-10-CM

## 2024-01-29 DIAGNOSIS — I2510 Atherosclerotic heart disease of native coronary artery without angina pectoris: Secondary | ICD-10-CM

## 2024-01-29 DIAGNOSIS — T50905A Adverse effect of unspecified drugs, medicaments and biological substances, initial encounter: Secondary | ICD-10-CM

## 2024-02-09 ENCOUNTER — Other Ambulatory Visit: Payer: Self-pay | Admitting: Internal Medicine

## 2024-02-09 DIAGNOSIS — I10 Essential (primary) hypertension: Secondary | ICD-10-CM

## 2024-02-09 DIAGNOSIS — E876 Hypokalemia: Secondary | ICD-10-CM

## 2024-02-17 DIAGNOSIS — G4733 Obstructive sleep apnea (adult) (pediatric): Secondary | ICD-10-CM | POA: Diagnosis not present

## 2024-02-17 DIAGNOSIS — I10 Essential (primary) hypertension: Secondary | ICD-10-CM | POA: Diagnosis not present

## 2024-03-05 ENCOUNTER — Other Ambulatory Visit: Payer: Self-pay | Admitting: Internal Medicine

## 2024-03-05 DIAGNOSIS — I10 Essential (primary) hypertension: Secondary | ICD-10-CM

## 2024-03-05 DIAGNOSIS — T50905A Adverse effect of unspecified drugs, medicaments and biological substances, initial encounter: Secondary | ICD-10-CM

## 2024-03-09 ENCOUNTER — Other Ambulatory Visit: Payer: Self-pay | Admitting: Internal Medicine

## 2024-03-09 ENCOUNTER — Other Ambulatory Visit: Payer: Self-pay | Admitting: Family

## 2024-03-09 DIAGNOSIS — I10 Essential (primary) hypertension: Secondary | ICD-10-CM

## 2024-03-09 DIAGNOSIS — T50905A Adverse effect of unspecified drugs, medicaments and biological substances, initial encounter: Secondary | ICD-10-CM

## 2024-03-18 DIAGNOSIS — I10 Essential (primary) hypertension: Secondary | ICD-10-CM | POA: Diagnosis not present

## 2024-03-18 DIAGNOSIS — G4733 Obstructive sleep apnea (adult) (pediatric): Secondary | ICD-10-CM | POA: Diagnosis not present

## 2024-04-07 ENCOUNTER — Other Ambulatory Visit: Payer: Self-pay | Admitting: Internal Medicine

## 2024-04-07 DIAGNOSIS — T50905A Adverse effect of unspecified drugs, medicaments and biological substances, initial encounter: Secondary | ICD-10-CM

## 2024-04-07 DIAGNOSIS — I10 Essential (primary) hypertension: Secondary | ICD-10-CM

## 2024-04-09 NOTE — Telephone Encounter (Signed)
 Last OV 08/13/23, f/u 4 months  Next OV not scheduled  Last refill(s) Amlodipine  03/12/24 Qty #30/0  Potassium Chloride  03/12/24 Qty #60/0  Pt past due for f/u in April 2025, please reach out to assist with scheduling.

## 2024-04-10 DIAGNOSIS — G4733 Obstructive sleep apnea (adult) (pediatric): Secondary | ICD-10-CM | POA: Diagnosis not present

## 2024-04-10 DIAGNOSIS — I10 Essential (primary) hypertension: Secondary | ICD-10-CM | POA: Diagnosis not present

## 2024-04-11 ENCOUNTER — Other Ambulatory Visit: Payer: Self-pay | Admitting: Internal Medicine

## 2024-04-11 DIAGNOSIS — I10 Essential (primary) hypertension: Secondary | ICD-10-CM

## 2024-04-11 DIAGNOSIS — T50905A Adverse effect of unspecified drugs, medicaments and biological substances, initial encounter: Secondary | ICD-10-CM

## 2024-04-19 ENCOUNTER — Other Ambulatory Visit: Payer: Self-pay | Admitting: Internal Medicine

## 2024-04-19 DIAGNOSIS — I10 Essential (primary) hypertension: Secondary | ICD-10-CM

## 2024-04-19 DIAGNOSIS — T50905A Adverse effect of unspecified drugs, medicaments and biological substances, initial encounter: Secondary | ICD-10-CM

## 2024-04-23 ENCOUNTER — Other Ambulatory Visit: Payer: Self-pay | Admitting: Internal Medicine

## 2024-04-23 DIAGNOSIS — T50905A Adverse effect of unspecified drugs, medicaments and biological substances, initial encounter: Secondary | ICD-10-CM

## 2024-04-23 DIAGNOSIS — I10 Essential (primary) hypertension: Secondary | ICD-10-CM

## 2024-05-19 ENCOUNTER — Other Ambulatory Visit: Payer: Self-pay | Admitting: Internal Medicine

## 2024-05-19 DIAGNOSIS — E039 Hypothyroidism, unspecified: Secondary | ICD-10-CM

## 2024-05-31 ENCOUNTER — Other Ambulatory Visit: Payer: Self-pay | Admitting: Internal Medicine

## 2024-05-31 DIAGNOSIS — I10 Essential (primary) hypertension: Secondary | ICD-10-CM

## 2024-05-31 DIAGNOSIS — T50905A Adverse effect of unspecified drugs, medicaments and biological substances, initial encounter: Secondary | ICD-10-CM

## 2024-05-31 DIAGNOSIS — E039 Hypothyroidism, unspecified: Secondary | ICD-10-CM

## 2024-06-05 ENCOUNTER — Encounter: Payer: Self-pay | Admitting: Internal Medicine

## 2024-06-30 ENCOUNTER — Other Ambulatory Visit: Payer: Self-pay | Admitting: Internal Medicine

## 2024-06-30 DIAGNOSIS — I10 Essential (primary) hypertension: Secondary | ICD-10-CM

## 2024-07-20 ENCOUNTER — Encounter: Payer: Self-pay | Admitting: Internal Medicine

## 2024-07-22 ENCOUNTER — Other Ambulatory Visit: Payer: Self-pay | Admitting: Internal Medicine

## 2024-07-22 ENCOUNTER — Ambulatory Visit: Payer: Self-pay | Admitting: Internal Medicine

## 2024-07-22 DIAGNOSIS — I10 Essential (primary) hypertension: Secondary | ICD-10-CM

## 2024-07-22 NOTE — Telephone Encounter (Signed)
 FYI Only or Action Required?: Action required by provider: medication refill request.  Patient was last seen in primary care on 08/13/2023 by Joshua Debby CROME, MD.  Called Nurse Triage reporting Hypertension, Headache, and Medication Refill.  Symptoms began several weeks ago.  Interventions attempted: Rest, hydration, or home remedies.  Symptoms are: unchanged.  Triage Disposition: Call PCP Within 24 Hours  Patient/caregiver understands and will follow disposition?: Yes         Reason for Disposition . Ran out of BP medications    Pt reports he is completely out of olmesartan  (BENICAR ) 20 MG. Pt has reached out via Mychart as well as pharmacy on his behalf without success.   Pt reports has been out for > 2 weeks. However, pt is still taking Norvasc  and Aldactone  and does not need any refills on those. Pt does not check BP regularly and has not recently checked/does not have access to check during triage call. Pt does have upcoming appt next month for physical.  Triager pended requested Rx and will forward encounter for Dr Joshua 's office to review and advise. Patient verbalized understanding and is expecting call back from office for confirmation.  Answer Assessment - Initial Assessment Questions 1. BLOOD PRESSURE: What is your blood pressure? Did you take at least two measurements 5 minutes apart?     I didn't take it this morning - endorses does not check regularly. 2. ONSET: When did you take your blood pressure?     > 2 weeks 3. HOW: How did you take your blood pressure? (e.g., automatic home BP monitor, visiting nurse)     auto 4. HISTORY: Do you have a history of high blood pressure?     yes 5. MEDICINES: Are you taking any medicines for blood pressure? Have you missed any doses recently?     Has been out of BP medications > 2 weeks 6. OTHER SYMPTOMS: Do you have any symptoms? (e.g., blurred vision, chest pain, difficulty breathing, headache,  weakness)     Headache. Denies others 7. PREGNANCY: Is there any chance you are pregnant? When was your last menstrual period?     N/a  Protocols used: Blood Pressure - High-A-AH

## 2024-07-22 NOTE — Telephone Encounter (Unsigned)
 Copied from CRM [id number]. Topic: Clinical - Medication Refill >> Jul 22, 2024 10:18 AM Alexandria E wrote: Medication: olmesartan  (BENICAR ) 20 MG tablet  Has the patient contacted their pharmacy? Yes (Agent: If no, request that the patient contact the pharmacy for the refill. If patient does not wish to contact the pharmacy document the reason why and proceed with request.) (Agent: If yes, when and what did the pharmacy advise?)  This is the patient's preferred pharmacy:  [id number] [place] [place] - [address] [place] [address] Phone: [phone] Fax: [phone]   Is this the correct pharmacy for this prescription? Yes If no, delete pharmacy and type the correct one.   Has the prescription been filled recently? No  Is the patient out of the medication? Yes  Has the patient been seen for an appointment in the last year OR does the patient have an upcoming appointment? Yes  Can we respond through MyChart? Yes  Agent: Please be advised that Rx refills may take up to 3 business days. We ask that you follow-up with your pharmacy.

## 2024-07-24 MED ORDER — OLMESARTAN MEDOXOMIL 20 MG PO TABS: 20.0000 mg | ORAL_TABLET | Freq: Every day | ORAL | 0 refills | Status: DC

## 2024-07-25 ENCOUNTER — Telehealth: Payer: Self-pay

## 2024-07-25 NOTE — Telephone Encounter (Signed)
 Medication has been called in. Temp supply until his appointment.

## 2024-07-25 NOTE — Telephone Encounter (Signed)
 Copied from CRM [id number]. Topic: Clinical - Prescription Issue >> Jul 25, 2024 11:47 AM Alfonso HERO wrote: Reason for CRM: [facility] calling [facility] for olmesartan  (BENICAR ) 20 MG tablet. Can this be called in or resent? Patient is out of the medication.

## 2024-08-13 ENCOUNTER — Ambulatory Visit: Admitting: Internal Medicine

## 2024-08-13 VITALS — BP 140/94 | HR 78 | Temp 98.5°F | Ht 72.0 in | Wt 235.2 lb

## 2024-08-13 DIAGNOSIS — D860 Sarcoidosis of lung: Secondary | ICD-10-CM

## 2024-08-13 DIAGNOSIS — E785 Hyperlipidemia, unspecified: Secondary | ICD-10-CM

## 2024-08-13 DIAGNOSIS — G4733 Obstructive sleep apnea (adult) (pediatric): Secondary | ICD-10-CM | POA: Diagnosis not present

## 2024-08-13 DIAGNOSIS — Z0001 Encounter for general adult medical examination with abnormal findings: Secondary | ICD-10-CM

## 2024-08-13 DIAGNOSIS — R931 Abnormal findings on diagnostic imaging of heart and coronary circulation: Secondary | ICD-10-CM

## 2024-08-13 DIAGNOSIS — I2510 Atherosclerotic heart disease of native coronary artery without angina pectoris: Secondary | ICD-10-CM

## 2024-08-13 DIAGNOSIS — I10 Essential (primary) hypertension: Secondary | ICD-10-CM

## 2024-08-13 DIAGNOSIS — R739 Hyperglycemia, unspecified: Secondary | ICD-10-CM

## 2024-08-13 DIAGNOSIS — E039 Hypothyroidism, unspecified: Secondary | ICD-10-CM | POA: Diagnosis not present

## 2024-08-13 DIAGNOSIS — Z0000 Encounter for general adult medical examination without abnormal findings: Secondary | ICD-10-CM | POA: Diagnosis not present

## 2024-08-13 DIAGNOSIS — E349 Endocrine disorder, unspecified: Secondary | ICD-10-CM | POA: Diagnosis not present

## 2024-08-13 LAB — LIPID PANEL
Cholesterol: 194 mg/dL (ref 0–200)
HDL: 38.9 mg/dL — ABNORMAL LOW (ref >39.00)
LDL Cholesterol: 127 mg/dL — ABNORMAL HIGH (ref 0–99)
NonHDL: 154.8
Total CHOL/HDL Ratio: 5
Triglycerides: 140 mg/dL (ref 0.0–149.0)
VLDL: 28 mg/dL (ref 0.0–40.0)

## 2024-08-13 LAB — URINALYSIS, ROUTINE W REFLEX MICROSCOPIC
Bilirubin Urine: NEGATIVE
Hgb urine dipstick: NEGATIVE
Ketones, ur: NEGATIVE
Leukocytes,Ua: NEGATIVE
Nitrite: NEGATIVE
RBC / HPF: NONE SEEN (ref 0–?)
Specific Gravity, Urine: <=1.005 — AB (ref 1.000–1.030)
Total Protein, Urine: NEGATIVE
Urine Glucose: NEGATIVE
Urobilinogen, UA: 0.2 (ref 0.0–1.0)
pH: 6.5 (ref 5.0–8.0)

## 2024-08-13 LAB — HEPATIC FUNCTION PANEL
ALT: 28 U/L (ref 0–53)
AST: 20 U/L (ref 0–37)
Albumin: 4.6 g/dL (ref 3.5–5.2)
Alkaline Phosphatase: 60 U/L (ref 39–117)
Bilirubin, Direct: 0.1 mg/dL (ref 0.0–0.3)
Total Bilirubin: 0.5 mg/dL (ref 0.2–1.2)
Total Protein: 7.3 g/dL (ref 6.0–8.3)

## 2024-08-13 LAB — CBC WITH DIFFERENTIAL/PLATELET
Basophils Absolute: 0 K/uL (ref 0.0–0.1)
Basophils Relative: 0.3 % (ref 0.0–3.0)
Eosinophils Absolute: 0.2 K/uL (ref 0.0–0.7)
Eosinophils Relative: 2.4 % (ref 0.0–5.0)
HCT: 41.7 % (ref 39.0–52.0)
Hemoglobin: 14.8 g/dL (ref 13.0–17.0)
Lymphocytes Relative: 28.2 % (ref 12.0–46.0)
Lymphs Abs: 2.2 K/uL (ref 0.7–4.0)
MCHC: 35.5 g/dL (ref 30.0–36.0)
MCV: 92.9 fl (ref 78.0–100.0)
Monocytes Absolute: 0.6 K/uL (ref 0.1–1.0)
Monocytes Relative: 7.8 % (ref 3.0–12.0)
Neutro Abs: 4.7 K/uL (ref 1.4–7.7)
Neutrophils Relative %: 61.3 % (ref 43.0–77.0)
Platelets: 264 K/uL (ref 150.0–400.0)
RBC: 4.49 Mil/uL (ref 4.22–5.81)
RDW: 12.4 % (ref 11.5–15.5)
WBC: 7.7 K/uL (ref 4.0–10.5)

## 2024-08-13 LAB — BASIC METABOLIC PANEL WITH GFR
BUN: 12 mg/dL (ref 6–23)
CO2: 27 meq/L (ref 19–32)
Calcium: 9.6 mg/dL (ref 8.4–10.5)
Chloride: 104 meq/L (ref 96–112)
Creatinine, Ser: 0.97 mg/dL (ref 0.40–1.50)
GFR: 87.66 mL/min (ref >60.00)
Glucose, Bld: 83 mg/dL (ref 70–99)
Potassium: 3.7 meq/L (ref 3.5–5.1)
Sodium: 137 meq/L (ref 135–145)

## 2024-08-13 LAB — PSA: PSA: 0.67 ng/mL (ref 0.10–4.00)

## 2024-08-13 LAB — LUTEINIZING HORMONE: LH: 4.51 m[IU]/mL (ref 1.50–9.30)

## 2024-08-13 LAB — HEMOGLOBIN A1C: Hgb A1c MFr Bld: 5.3 % (ref 4.6–6.5)

## 2024-08-13 LAB — TSH: TSH: 2.8 u[IU]/mL (ref 0.35–5.50)

## 2024-08-13 MED ORDER — ASPIRIN 81 MG PO TBEC: 81.0000 mg | DELAYED_RELEASE_TABLET | Freq: Every day | ORAL | 12 refills | Status: AC

## 2024-08-13 MED ORDER — ROSUVASTATIN CALCIUM 20 MG PO TABS: 20.0000 mg | ORAL_TABLET | Freq: Every day | ORAL | 1 refills | Status: AC

## 2024-08-13 MED ORDER — OLMESARTAN MEDOXOMIL 20 MG PO TABS: 20.0000 mg | ORAL_TABLET | Freq: Every day | ORAL | 1 refills | Status: AC

## 2024-08-13 MED ORDER — AMLODIPINE BESYLATE 10 MG PO TABS: 10.0000 mg | ORAL_TABLET | Freq: Every day | ORAL | 1 refills | Status: AC

## 2024-08-13 NOTE — Progress Notes (Signed)
 Subjective:  Patient ID: Chase Mcclure, male    DOB: 03-Oct-1970  Age: 53 y.o. MRN: [id number]  CC: Annual Exam, Hyperlipidemia, Hypertension, and Hypothyroidism   HPI Chase Mcclure presents for a CPX and f/up ----  Discussed the use of AI scribe software for clinical note transcription with the patient, who gave verbal consent to proceed.  History of Present Illness Chase Mcclure is a 53 year old male who presents for an annual physical exam.  He has been maintaining an active lifestyle, although his activity level decreases during colder weather. He denies chest pain, shortness of breath, dizziness, or lightheadedness.  He used Zepbound  for two months earlier in the year, which facilitated weight loss, but discontinued it due to cost. Despite this, he has lost eight pounds since January. He has no symptoms of thyroid  dysfunction, such as constipation, fatigue, or diarrhea, and his thyroid  medication regimen remains unchanged.  He has not consulted a urologist recently but notes a slower urinary stream compared to his younger years. He occasionally experiences nocturia but denies dysuria.  He experiences occasional hypertension and headaches. He has been without his blood pressure medication, olmesartan , for four to five days due to pharmacy supply issues. He recently received a partial prescription but ran out again a couple of days ago. All other medications are current and managed well.  Regarding vaccinations, he does not receive flu vaccines but has had the original COVID vaccine. He believes he is current on pneumonia and hepatitis B vaccines and received the shingles vaccine in 2023. His tetanus shot was updated six years ago. His last colonoscopy in 2019 was normal except for mild diverticulitis.     Outpatient Medications Prior to Visit  Medication Sig Dispense Refill   cetirizine (ZYRTEC) 10 MG tablet Take 10 mg by mouth daily.     levothyroxine  (SYNTHROID )  25 MCG tablet TAKE 1 TABLET BY MOUTH DAILY BEFORE BREAKFAST. 30 tablet 3   Multiple Vitamin (MULTIVITAMIN) capsule Take 1 capsule by mouth daily.     potassium chloride  (KLOR-CON ) 10 MEQ tablet TAKE 1 TABLET (10 MEQ TOTAL) BY MOUTH 2 (TWO) TIMES DAILY. NEEDS OFFICE VISIT FOR REFILLS 60 tablet 0   spironolactone  (ALDACTONE ) 25 MG tablet TAKE 1 TABLET (25 MG TOTAL) BY MOUTH DAILY. 30 tablet 2   amLODipine  (NORVASC ) 5 MG tablet TAKE 1 TABLET (5 MG TOTAL) BY MOUTH DAILY. NEEDS OFFICE VISIT FOR REFILLS 90 tablet 1   ASPIRIN  LOW DOSE 81 MG tablet TAKE 1 TABLET (81 MG TOTAL) BY MOUTH DAILY. SWALLOW WHOLE. 30 tablet 12   olmesartan  (BENICAR ) 20 MG tablet Take 1 tablet (20 mg total) by mouth daily. 90 tablet 0   rosuvastatin  (CRESTOR ) 20 MG tablet Take 1 tablet (20 mg total) by mouth daily. 90 tablet 1   tirzepatide  5 MG/0.5ML injection vial Inject 5 mg into the skin once a week. 12 mL 0   No facility-administered medications prior to visit.    ROS Review of Systems  Constitutional: Negative.  Negative for appetite change, chills, diaphoresis, fatigue and fever.  HENT: Negative.    Eyes: Negative.   Respiratory:  Positive for apnea. Negative for cough, shortness of breath and wheezing.   Cardiovascular:  Negative for chest pain, palpitations and leg swelling.  Gastrointestinal: Negative.  Negative for abdominal pain, constipation, diarrhea, nausea and vomiting.  Genitourinary: Negative.  Negative for difficulty urinating.  Musculoskeletal: Negative.  Negative for arthralgias and myalgias.  Skin: Negative.   Neurological:  Negative for dizziness, weakness and light-headedness.  Hematological:  Negative for adenopathy. Does not bruise/bleed easily.  Psychiatric/Behavioral: Negative.      Objective:  BP (!) 140/94 (BP Location: Left Arm, Patient Position: Sitting, Cuff Size: Normal)   Pulse 78   Temp 98.5 F (36.9 C) (Oral)   Ht 6' (1.829 m)   Wt 235 lb 3.2 oz (106.7 kg)   SpO2 97%   BMI  31.90 kg/m   BP Readings from Last 3 Encounters:  08/13/24 (!) 140/94  09/24/23 136/78  08/13/23 (!) 146/92    Wt Readings from Last 3 Encounters:  08/13/24 235 lb 3.2 oz (106.7 kg)  09/24/23 247 lb (112 kg)  08/13/23 249 lb (112.9 kg)    Physical Exam Vitals reviewed.  Constitutional:      Appearance: Normal appearance.  HENT:     Nose: Nose normal.     Mouth/Throat:     Mouth: Mucous membranes are moist.  Eyes:     General: No scleral icterus.    Conjunctiva/sclera: Conjunctivae normal.  Cardiovascular:     Rate and Rhythm: Normal rate and regular rhythm.     Heart sounds: Normal heart sounds, S1 normal and S2 normal. No murmur heard.    No friction rub. No gallop.     Comments: EKG ----  NSR, 67 bpm No LVH, Q waves, or ST/T wave changes  Pulmonary:     Effort: Pulmonary effort is normal.     Breath sounds: No stridor. No wheezing, rhonchi or rales.  Abdominal:     General: Abdomen is flat. Bowel sounds are normal.     Palpations: There is no mass.     Tenderness: There is no abdominal tenderness. There is no guarding.     Hernia: No hernia is present. There is no hernia in the left inguinal area or right inguinal area.  Genitourinary:    Pubic Area: No rash.      Penis: Normal.      Testes: Normal.     Epididymis:     Right: Normal.     Left: Normal.     Prostate: Normal. Not enlarged, not tender and no nodules present.     Rectum: Normal. Guaiac result negative. No mass, tenderness, anal fissure, external hemorrhoid or internal hemorrhoid. Normal anal tone.  Musculoskeletal:     Cervical back: Neck supple.     Right lower leg: No edema.     Left lower leg: No edema.  Lymphadenopathy:     Cervical: No cervical adenopathy.     Lower Body: No right inguinal adenopathy. No left inguinal adenopathy.  Skin:    General: Skin is warm and dry.     Coloration: Skin is not pale.     Findings: No lesion or rash.  Neurological:     General: No focal deficit  present.     Mental Status: He is alert.  Psychiatric:        Mood and Affect: Mood normal.        Behavior: Behavior normal.     Lab Results  Component Value Date   WBC 7.7 08/13/2024   HGB 14.8 08/13/2024   HCT 41.7 08/13/2024   PLT 264.0 08/13/2024   GLUCOSE 83 08/13/2024   CHOL 194 08/13/2024   TRIG 140.0 08/13/2024   HDL 38.90 (L) 08/13/2024   LDLDIRECT 146.0 10/04/2021   LDLCALC 127 (H) 08/13/2024   ALT 28 08/13/2024   AST 20 08/13/2024   NA 137 08/13/2024  K 3.7 08/13/2024   CL 104 08/13/2024   CREATININE 0.97 08/13/2024   BUN 12 08/13/2024   CO2 27 08/13/2024   TSH 2.80 08/13/2024   PSA 0.67 08/13/2024   INR 1.11 12/29/2014   HGBA1C 5.3 08/13/2024    CT CORONARY MORPH W/CTA COR W/SCORE W/CA W/CM &/OR WO/CM Addendum Date: 01/25/2023 ADDENDUM REPORT: 01/25/2023 17:14 EXAM: OVER-READ INTERPRETATION  CT CHEST The following report is an over-read performed by radiologist Dr. Jackquline Skates [facility] on 01/25/2023. This over-read does not include interpretation of cardiac or coronary anatomy or pathology. The CTA interpretation by the cardiologist is attached. COMPARISON:  None. FINDINGS: Limited view of the lung parenchyma demonstrates no suspicious nodularity. Airways are normal. Limited view of the mediastinum demonstrates no adenopathy. Esophagus normal. Limited view of the upper abdomen unremarkable. Limited view of the skeleton and chest wall is unremarkable. IMPRESSION: No significant extracardiac findings. Electronically Signed   By: Jackquline Boxer M.D.   On: 01/25/2023 17:14   Result Date: 01/25/2023 CLINICAL DATA:  Chest pain EXAM: Cardiac CTA MEDICATIONS: Sub lingual nitro. 4mg  x 2 TECHNIQUE: The patient was scanned on a Siemens 192 slice scanner. Gantry rotation speed was 250 msecs. Collimation was 0.6 mm. A 100 kV prospective scan was triggered in the ascending thoracic aorta at 35-75% of the R-R interval. Average HR during the scan was 60 bpm.  The 3D data set was interpreted on a dedicated work station using MPR, MIP and VRT modes. A total of 80cc of contrast was used. FINDINGS: Non-cardiac: See separate report from [facility]. No LA appendage thrombus. Pulmonary veins drain normally to the left atrium. Calcium  Score: 128 Agatston units. Coronary Arteries: Right dominant with no anomalies LM: No plaque or stenosis. LAD system: Calcified plaque proximal LAD, mild (25-49%) stenosis. Calcified plaque ostial D1, mild (25-49%) stenosis. Circumflex system: Calcified plaque proximal LCx, mild (1-24%) stenosis. RCA system: No plaque or stenosis. IMPRESSION: 1. Coronary artery calcium  score 128 Agatston units. This places the patient in the 85th percentile for age and gender, suggesting high risk for future cardiac events. 2.  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 [employer] Electronically Signed: By: Ezra Shuck M.D. On: 01/22/2023 13:31    Assessment & Plan:  Pulmonary sarcoidosis (HCC) -     Hepatic function panel; Future -     Basic metabolic panel with GFR; Future  Essential hypertension- His BP is not at goal. Will increase the CCB dose. -     Olmesartan  Medoxomil; Take 1 tablet (20 mg total) by mouth daily.  Dispense: 90 tablet; Refill: 1 -     TSH; Future -     Urinalysis, Routine w reflex microscopic; Future -     Hepatic function panel; Future -     CBC with Differential/Platelet; Future -     EKG 12-Lead -     amLODIPine  Besylate; Take 1 tablet (10 mg total) by mouth daily.  Dispense: 90 tablet; Refill: 1 -     AMB Referral [facility] Care Management  Hyperlipidemia LDL goal <100- He has not achieved his LDL goal. Will restart the statin. -     Lipid panel; Future -     TSH; Future -     Hepatic function panel; Future -     Rosuvastatin  Calcium ; Take 1 tablet (20 mg total) by mouth daily.  Dispense: 90 tablet; Refill: 1 -     AMB Referral [facility]I Care Management  Coronary artery disease involving native coronary  artery  of native heart without angina pectoris -     Lipid panel; Future -     EKG 12-Lead -     amLODIPine  Besylate; Take 1 tablet (10 mg total) by mouth daily.  Dispense: 90 tablet; Refill: 1 -     Aspirin ; Take 1 tablet (81 mg total) by mouth 5 (five) times daily. SWALLOW WHOLE.  Dispense: 30 tablet; Refill: 12  Acquired hypothyroidism- He is euthyroid. -     TSH; Future  Chronic hyperglycemia -     Hemoglobin A1c; Future -     Basic metabolic panel with GFR; Future  OSA (obstructive sleep apnea)  Encounter for general adult medical examination with abnormal findings- Exam completed, labs reviewed, vaccines reviewed (he refused), cancer screenings addressed, pt ed material was given.  -     Lipid panel; Future -     PSA; Future  Testosterone  insufficiency -     Testosterone  Total,Free,Bio, Males; Future -     Luteinizing hormone; Future  Agatston coronary artery calcium  score between 100 and 199 -     Rosuvastatin  Calcium ; Take 1 tablet (20 mg total) by mouth daily.  Dispense: 90 tablet; Refill: 1     Follow-up: Return in about 6 months (around 02/11/2025).  Debby Molt, MD

## 2024-08-13 NOTE — Patient Instructions (Signed)
 Health Maintenance, Male  Adopting a healthy lifestyle and getting preventive care are important in promoting health and wellness. Ask your health care provider about:  The right schedule for you to have regular tests and exams.  Things you can do on your own to prevent diseases and keep yourself healthy.  What should I know about diet, weight, and exercise?  Eat a healthy diet    Eat a diet that includes plenty of vegetables, fruits, low-fat dairy products, and lean protein.  Do not eat a lot of foods that are high in solid fats, added sugars, or sodium.  Maintain a healthy weight  Body mass index (BMI) is a measurement that can be used to identify possible weight problems. It estimates body fat based on height and weight. Your health care provider can help determine your BMI and help you achieve or maintain a healthy weight.  Get regular exercise  Get regular exercise. This is one of the most important things you can do for your health. Most adults should:  Exercise for at least 150 minutes each week. The exercise should increase your heart rate and make you sweat (moderate-intensity exercise).  Do strengthening exercises at least twice a week. This is in addition to the moderate-intensity exercise.  Spend less time sitting. Even light physical activity can be beneficial.  Watch cholesterol and blood lipids  Have your blood tested for lipids and cholesterol at 55 years of age, then have this test every 5 years.  You may need to have your cholesterol levels checked more often if:  Your lipid or cholesterol levels are high.  You are older than 55 years of age.  You are at high risk for heart disease.  What should I know about cancer screening?  Many types of cancers can be detected early and may often be prevented. Depending on your health history and family history, you may need to have cancer screening at various ages. This may include screening for:  Colorectal cancer.  Prostate cancer.  Skin cancer.  Lung  cancer.  What should I know about heart disease, diabetes, and high blood pressure?  Blood pressure and heart disease  High blood pressure causes heart disease and increases the risk of stroke. This is more likely to develop in people who have high blood pressure readings or are overweight.  Talk with your health care provider about your target blood pressure readings.  Have your blood pressure checked:  Every 3-5 years if you are 55-55 years of age.  Every year if you are 3 years old or older.  If you are between the ages of 60 and 72 and are a current or former smoker, ask your health care provider if you should have a one-time screening for abdominal aortic aneurysm (AAA).  Diabetes  Have regular diabetes screenings. This checks your fasting blood sugar level. Have the screening done:  Once every three years after age 55 if you are at a normal weight and have a low risk for diabetes.  More often and at a younger age if you are overweight or have a high risk for diabetes.  What should I know about preventing infection?  Hepatitis B  If you have a higher risk for hepatitis B, you should be screened for this virus. Talk with your health care provider to find out if you are at risk for hepatitis B infection.  Hepatitis C  Blood testing is recommended for:  Everyone born from [birth year] through 1967.  Anyone  with known risk factors for hepatitis C.  Sexually transmitted infections (STIs)  You should be screened each year for STIs, including gonorrhea and chlamydia, if:  You are sexually active and are younger than 55 years of age.  You are older than 55 years of age and your health care provider tells you that you are at risk for this type of infection.  Your sexual activity has changed since you were last screened, and you are at increased risk for chlamydia or gonorrhea. Ask your health care provider if you are at risk.  Ask your health care provider about whether you are at high risk for HIV. Your health care provider  may recommend a prescription medicine to help prevent HIV infection. If you choose to take medicine to prevent HIV, you should first get tested for HIV. You should then be tested every 3 months for as long as you are taking the medicine.  Follow these instructions at home:  Alcohol use  Do not drink alcohol if your health care provider tells you not to drink.  If you drink alcohol:  Limit how much you have to 0-2 drinks a day.  Know how much alcohol is in your drink. In the [place] one drink equals one 12 oz bottle of beer (355 mL), one 5 oz glass of wine (148 mL), or one 1 oz glass of hard liquor (44 mL).  Lifestyle  Do not use any products that contain nicotine or tobacco. These products include cigarettes, chewing tobacco, and vaping devices, such as e-cigarettes. If you need help quitting, ask your health care provider.  Do not use street drugs.  Do not share needles.  Ask your health care provider for help if you need support or information about quitting drugs.  General instructions  Schedule regular health, dental, and eye exams.  Stay current with your vaccines.  Tell your health care provider if:  You often feel depressed.  You have ever been abused or do not feel safe at home.  Summary  Adopting a healthy lifestyle and getting preventive care are important in promoting health and wellness.  Follow your health care provider's instructions about healthy diet, exercising, and getting tested or screened for diseases.  Follow your health care provider's instructions on monitoring your cholesterol and blood pressure.  This information is not intended to replace advice given to you by your health care provider. Make sure you discuss any questions you have with your health care provider.  Document Revised: 01/10/2021 Document Reviewed: 01/10/2021  Elsevier Patient Education  2024 [employer].

## 2024-08-14 ENCOUNTER — Ambulatory Visit: Payer: Self-pay | Admitting: Internal Medicine

## 2024-08-14 LAB — TESTOSTERONE TOTAL,FREE,BIO, MALES
Albumin: 4.6 g/dL (ref 3.6–5.1)
Sex Hormone Binding: 32 nmol/L (ref 10–50)
Testosterone, Bioavailable: 94.9 ng/dL — ABNORMAL LOW (ref 110.0–575.0)
Testosterone, Free: 45.2 pg/mL — ABNORMAL LOW (ref 46.0–224.0)
Testosterone: 345 ng/dL (ref 250–827)

## 2024-08-18 ENCOUNTER — Telehealth: Payer: Self-pay | Admitting: *Deleted

## 2024-08-18 NOTE — Progress Notes (Unsigned)
 Care Guide Pharmacy Note  08/18/2024 Name: Chase Mcclure MRN: [id number] DOB: [birth year]  Referred By: Joshua Debby LITTIE, MD Reason for referral: Call Attempt #1 and Complex Care Management (Outreach to schedule referral with pharmacist )   [name] is a 53 y.o. year old male who is a primary care patient of Joshua Debby LITTIE, MD.  [name] was referred to the pharmacist for assistance related to: HTN  An unsuccessful telephone outreach was attempted today to contact the patient who was referred to the pharmacy team for assistance with medication management. Additional attempts will be made to contact the patient.  [name], CMA [place]  [facility], [facility] Guide Direct Dial: [phone]  Fax: [phone] Website: [email or web address]

## 2024-08-19 NOTE — Progress Notes (Unsigned)
 Care Guide Pharmacy Note  08/19/2024 Name: AMARIE TARTE MRN: [id number] DOB: 07/09/1971  Referred By: Joshua Debby LITTIE, MD Reason for referral: Call Attempt #1 and Complex Care Management (Outreach to schedule referral with pharmacist )   SAMY RYNER is a 53 y.o. year old male who is a primary care patient of Joshua Debby LITTIE, MD.  [name] was referred to the pharmacist for assistance related to: HTN  A second unsuccessful telephone outreach was attempted today to contact the patient who was referred to the pharmacy team for assistance with medication management. Additional attempts will be made to contact the patient.  [name], CMA [place]  [facility], [facility] Guide Direct Dial: [phone]  Fax: [phone] Website: [email or web address]

## 2024-08-20 NOTE — Progress Notes (Signed)
 Care Guide Pharmacy Note  08/20/2024 Name: Chase Mcclure MRN: [id number] DOB: 1971/03/27  Referred By: Joshua Debby LITTIE, MD Reason for referral: Call Attempt #1 and Complex Care Management (Outreach to schedule referral with pharmacist )   Chase Mcclure is a 53 y.o. year old male who is a primary care patient of Joshua Debby LITTIE, MD.  [name] was referred to the pharmacist for assistance related to: HTN  A third unsuccessful telephone outreach was attempted today to contact the patient who was referred to the pharmacy team for assistance with medication management. The Population Health team is pleased to engage with this patient at any time in the future upon receipt of referral and should he/she be interested in assistance from the Population Health team.  [name], CMA [facility]  [facility], [facility] Guide Direct Dial: [phone]  Fax: [phone] Website: [email or web address]

## 2024-08-29 ENCOUNTER — Other Ambulatory Visit: Payer: Self-pay | Admitting: Internal Medicine

## 2024-08-29 DIAGNOSIS — I10 Essential (primary) hypertension: Secondary | ICD-10-CM

## 2024-08-29 DIAGNOSIS — E876 Hypokalemia: Secondary | ICD-10-CM

## 2024-09-29 ENCOUNTER — Other Ambulatory Visit: Payer: Self-pay | Admitting: Internal Medicine

## 2024-09-29 DIAGNOSIS — E039 Hypothyroidism, unspecified: Secondary | ICD-10-CM

## 2024-10-10 ENCOUNTER — Other Ambulatory Visit (HOSPITAL_BASED_OUTPATIENT_CLINIC_OR_DEPARTMENT_OTHER): Payer: Self-pay

## 2024-10-10 DIAGNOSIS — R0602 Shortness of breath: Secondary | ICD-10-CM

## 2024-10-21 ENCOUNTER — Ambulatory Visit (HOSPITAL_BASED_OUTPATIENT_CLINIC_OR_DEPARTMENT_OTHER): Admitting: Pulmonary Disease

## 2024-10-21 ENCOUNTER — Encounter (HOSPITAL_BASED_OUTPATIENT_CLINIC_OR_DEPARTMENT_OTHER)
